# Patient Record
Sex: Male | Born: 1970 | Race: Black or African American | Hispanic: No | Marital: Married | Smoking: Former smoker
Health system: Southern US, Community
[De-identification: ages and names within clinical notes are randomized; demographics above are authoritative.]

## PROBLEM LIST (undated history)

## (undated) DIAGNOSIS — F419 Anxiety disorder, unspecified: Secondary | ICD-10-CM

## (undated) DIAGNOSIS — R05 Cough: Secondary | ICD-10-CM

## (undated) DIAGNOSIS — R059 Cough, unspecified: Secondary | ICD-10-CM

## (undated) DIAGNOSIS — R531 Weakness: Secondary | ICD-10-CM

## (undated) DIAGNOSIS — Z720 Tobacco use: Secondary | ICD-10-CM

## (undated) DIAGNOSIS — Z950 Presence of cardiac pacemaker: Secondary | ICD-10-CM

## (undated) DIAGNOSIS — H547 Unspecified visual loss: Secondary | ICD-10-CM

## (undated) DIAGNOSIS — I639 Cerebral infarction, unspecified: Secondary | ICD-10-CM

## (undated) DIAGNOSIS — I509 Heart failure, unspecified: Secondary | ICD-10-CM

## (undated) DIAGNOSIS — R0602 Shortness of breath: Secondary | ICD-10-CM

## (undated) HISTORY — DX: Cough, unspecified: R05.9

## (undated) HISTORY — DX: Presence of cardiac pacemaker: Z95.0

## (undated) HISTORY — DX: Unspecified visual loss: H54.7

## (undated) HISTORY — DX: Heart failure, unspecified: I50.9

## (undated) HISTORY — DX: Shortness of breath: R06.02

---
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
## 1999-02-26 ENCOUNTER — Emergency Department (HOSPITAL_COMMUNITY): Admission: EM | Admit: 1999-02-26 | Discharge: 1999-02-26 | Payer: Self-pay | Admitting: Emergency Medicine

## 2009-10-10 ENCOUNTER — Emergency Department (HOSPITAL_COMMUNITY): Admission: EM | Admit: 2009-10-10 | Discharge: 2009-10-10 | Payer: Self-pay | Admitting: Emergency Medicine

## 2009-10-12 ENCOUNTER — Emergency Department (HOSPITAL_COMMUNITY): Admission: EM | Admit: 2009-10-12 | Discharge: 2009-10-12 | Payer: Self-pay | Admitting: Emergency Medicine

## 2010-02-13 ENCOUNTER — Emergency Department (HOSPITAL_COMMUNITY): Admission: EM | Admit: 2010-02-13 | Discharge: 2010-02-13 | Payer: Self-pay | Admitting: Emergency Medicine

## 2011-03-02 LAB — DIFFERENTIAL
Eosinophils Absolute: 0.2 10*3/uL (ref 0.0–0.7)
Eosinophils Relative: 2 % (ref 0–5)
Lymphocytes Relative: 21 % (ref 12–46)
Lymphs Abs: 2.8 10*3/uL (ref 0.7–4.0)
Neutro Abs: 9.2 10*3/uL — ABNORMAL HIGH (ref 1.7–7.7)

## 2011-03-02 LAB — CBC
Platelets: 210 10*3/uL (ref 150–400)
RBC: 6.92 MIL/uL — ABNORMAL HIGH (ref 4.22–5.81)
WBC: 13.5 10*3/uL — ABNORMAL HIGH (ref 4.0–10.5)

## 2012-06-28 DIAGNOSIS — Z0271 Encounter for disability determination: Secondary | ICD-10-CM

## 2012-07-12 ENCOUNTER — Ambulatory Visit: Payer: No Typology Code available for payment source

## 2012-07-12 ENCOUNTER — Ambulatory Visit (INDEPENDENT_AMBULATORY_CARE_PROVIDER_SITE_OTHER): Payer: No Typology Code available for payment source | Admitting: Emergency Medicine

## 2012-07-12 VITALS — BP 115/89 | HR 80 | Temp 98.5°F | Resp 18 | Ht 71.0 in | Wt 166.0 lb

## 2012-07-12 DIAGNOSIS — S139XXA Sprain of joints and ligaments of unspecified parts of neck, initial encounter: Secondary | ICD-10-CM

## 2012-07-12 DIAGNOSIS — M546 Pain in thoracic spine: Secondary | ICD-10-CM

## 2012-07-12 DIAGNOSIS — S161XXA Strain of muscle, fascia and tendon at neck level, initial encounter: Secondary | ICD-10-CM

## 2012-07-12 MED ORDER — CYCLOBENZAPRINE HCL 5 MG PO TABS
5.0000 mg | ORAL_TABLET | Freq: Three times a day (TID) | ORAL | Status: AC | PRN
Start: 2012-07-12 — End: 2012-07-22

## 2012-07-12 MED ORDER — IBUPROFEN 600 MG PO TABS: 600.0000 mg | ORAL_TABLET | Freq: Three times a day (TID) | ORAL | Status: AC | PRN
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
## 2012-11-28 DIAGNOSIS — Z0271 Encounter for disability determination: Secondary | ICD-10-CM

## 2017-02-26 ENCOUNTER — Emergency Department (HOSPITAL_COMMUNITY)
Admission: EM | Admit: 2017-02-26 | Discharge: 2017-02-26 | Disposition: A | Discharge status: Home / Self Care | Payer: PRIVATE HEALTH INSURANCE | Attending: Emergency Medicine | Admitting: Emergency Medicine

## 2017-02-26 ENCOUNTER — Encounter (HOSPITAL_COMMUNITY): Payer: Self-pay | Admitting: Emergency Medicine

## 2017-02-26 DIAGNOSIS — F17210 Nicotine dependence, cigarettes, uncomplicated: Secondary | ICD-10-CM | POA: Diagnosis not present

## 2017-02-26 DIAGNOSIS — K0889 Other specified disorders of teeth and supporting structures: Secondary | ICD-10-CM | POA: Insufficient documentation

## 2017-02-26 MED ORDER — PENICILLIN V POTASSIUM 500 MG PO TABS: 1000.0000 mg | ORAL_TABLET | Freq: Two times a day (BID) | ORAL | 0 refills | Status: DC

## 2017-02-26 MED ORDER — BUPIVACAINE-EPINEPHRINE (PF) 0.5% -1:200000 IJ SOLN
1.8000 mL | Freq: Once | INTRAMUSCULAR | Status: AC
  Administered 2017-02-26: 1.8 mL
  Filled 2017-02-26: qty 1.8

## 2017-02-26 NOTE — ED Provider Notes (Signed)
[facility]-EMERGENCY DEPT Provider Note   CSN: [id number] Arrival date & time: 02/26/17  0030     History   Chief Complaint Chief Complaint  Patient presents with  . Dental Pain    HPI Brandon Summers is a 46 y.o. male with No major medical history presents to the Emergency Department complaining of gradual, persistent, progressively worsening right lower dental pain onset earlier today. Patient reports he's had a dental caries in this area for a significant amount of time however they have not given him pain like today.  He denies fevers or chills, nausea or vomiting. He denies facial swelling. Patient has not seen a dentist in a very long time. Eating and drinking make the symptoms worse. Patient is taking ibuprofen without relief. Nothing seems to make the symptoms better.  The history is provided by the patient and medical records. No language interpreter was used.  Dental Pain      History reviewed. No pertinent past medical history.  There are no active problems to display for this patient.   History reviewed. No pertinent surgical history.     Home Medications    Prior to Admission medications   Medication Sig Start Date End Date Taking? Authorizing Provider  penicillin v potassium (VEETID) 500 MG tablet Take 2 tablets (1,000 mg total) by mouth 2 (two) times daily. X 7 days 02/26/17   Dierdre Forth, PA-C    Family History Family History  Problem Relation Age of Onset  . Hypertension Mother   . Stroke Mother     Social History Social History  Substance Use Topics  . Smoking status: Current Every Day Smoker    Packs/day: 0.50    Types: Cigarettes  . Smokeless tobacco: Never Used  . Alcohol use Yes     Comment: occasional     Allergies   Asa [aspirin]   Review of Systems Review of Systems  HENT: Positive for dental problem.   All other systems reviewed and are negative.    Physical Exam Updated Vital Signs BP (!) 141/89 (BP Location: Right Arm)    Pulse 78   Temp 98.2 F (36.8 C) (Oral)   Resp 18   Ht 6' (1.829 m)   Wt 58.9 kg   SpO2 97%   BMI 17.60 kg/m   Physical Exam  Constitutional: He appears well-developed and well-nourished.  HENT:  Head: Normocephalic.  Right Ear: Tympanic membrane, external ear and ear canal normal.  Left Ear: Tympanic membrane, external ear and ear canal normal.  Nose: Nose normal. Right sinus exhibits no maxillary sinus tenderness and no frontal sinus tenderness. Left sinus exhibits no maxillary sinus tenderness and no frontal sinus tenderness.  Mouth/Throat: Uvula is midline, oropharynx is clear and moist and mucous membranes are normal. No oral lesions. Abnormal dentition. Dental caries present. No uvula swelling or lacerations. No oropharyngeal exudate, posterior oropharyngeal edema, posterior oropharyngeal erythema or tonsillar abscesses.  Tooth # 30-32 with extensive dental caries, eroding teeth to the gingiva No gross abscess No fluctuance or induration to the buccal mucosa or floor of the mouth  Eyes: Conjunctivae are normal. Pupils are equal, round, and reactive to light. Right eye exhibits no discharge. Left eye exhibits no discharge.  Neck: Normal range of motion. Neck supple.  No stridor Handling secretions without difficulty No nuchal rigidity No cervical lymphadenopathy  Cardiovascular: Normal rate, regular rhythm and normal heart sounds.   Pulmonary/Chest: Effort normal. No respiratory distress.  Equal chest rise  Abdominal: Soft. Bowel sounds are  normal. He exhibits no distension. There is no tenderness.  Lymphadenopathy:    He has no cervical adenopathy.  Neurological: He is alert.  Skin: Skin is warm and dry.  Psychiatric: He has a normal mood and affect.  Nursing note and vitals reviewed.    ED Treatments / Results    Procedures Dental Block Date/Time: 02/26/2017 3:48 AM Performed by: Dierdre Forth Authorized by: Dierdre Forth   Consent:    Consent  obtained:  Verbal   Consent given by:  Patient   Risks discussed:  Allergic reaction, infection, intravascular injection, nerve damage and pain   Alternatives discussed:  No treatment Indications:    Indications: dental pain   Location:    Block type:  Supraperiosteal   Supraperiosteal location:  Lower teeth   Lower teeth location:  30/RL 1st molar, 31/RL 2nd molar and 32/RL 3rd molar Procedure details (see MAR for exact dosages):    Syringe type:  Controlled syringe   Needle gauge:  27 G   Anesthetic injected:  Bupivacaine 0.5% WITH epi   Injection procedure:  Anatomic landmarks identified, anatomic landmarks palpated, negative aspiration for blood, introduced needle and incremental injection Post-procedure details:    Outcome:  Pain relieved   Patient tolerance of procedure:  Tolerated well, no immediate complications   (including critical care time)  Medications Ordered in ED Medications  bupivacaine-epinephrine (MARCAINE W/ EPI) 0.5% -1:200000 injection 1.8 mL (1.8 mLs Infiltration Given 02/26/17 0331)     Initial Impression / Assessment and Plan / ED Course  I have reviewed the triage vital signs and the nursing notes.  Pertinent labs & imaging results that were available during my care of the patient were reviewed by me and considered in my medical decision making (see chart for details).     Patient with toothache.  No gross abscess.  Exam unconcerning for Ludwig's angina or spread of infection.  Will treat with penicillin andibuprofen.  Urged patient to follow-up with dentist.     Final Clinical Impressions(s) / ED Diagnoses   Final diagnoses:  Pain, dental    New Prescriptions New Prescriptions   PENICILLIN V POTASSIUM (VEETID) 500 MG TABLET    Take 2 tablets (1,000 mg total) by mouth 2 (two) times daily. X 7 days     Dierdre Forth, PA-C 02/26/17 1610    Tomasita Crumble, MD 02/26/17 [phone]

## 2017-02-26 NOTE — Discharge Instructions (Signed)
1. Medications: penicillin, usual home medications °2. Treatment: rest, drink plenty of fluids, take medications as prescribed °3. Follow Up: Please followup with dentistry within 1 week for discussion of your diagnoses and further evaluation after today's visit; if you do not have a primary care doctor use the resource guide provided to find one; Return to the ER for high fevers, difficulty breathing, difficulty swallowing or other concerning symptoms ° °

## 2017-02-26 NOTE — ED Notes (Signed)
Pt has onset of dental pain today in the bottom right molars. He has taken advil for pain management.

## 2017-02-26 NOTE — ED Notes (Signed)
No respiratory or acute distress noted alert and oriented x 3 call light in reach. 

## 2017-02-26 NOTE — ED Triage Notes (Signed)
Pt reports dental pain to right lower portion of mouth that started today.

## 2019-05-21 ENCOUNTER — Emergency Department (HOSPITAL_COMMUNITY): Payer: Self-pay

## 2019-05-21 ENCOUNTER — Encounter (HOSPITAL_COMMUNITY): Payer: Self-pay

## 2019-05-21 ENCOUNTER — Other Ambulatory Visit: Payer: Self-pay

## 2019-05-21 ENCOUNTER — Inpatient Hospital Stay (HOSPITAL_COMMUNITY)
Admission: EM | Admit: 2019-05-21 | Discharge: 2019-05-23 | DRG: 066 | Disposition: A | Discharge status: Home Health | Payer: PRIVATE HEALTH INSURANCE | Attending: Internal Medicine | Admitting: Internal Medicine

## 2019-05-21 DIAGNOSIS — I639 Cerebral infarction, unspecified: Secondary | ICD-10-CM

## 2019-05-21 DIAGNOSIS — R29706 NIHSS score 6: Secondary | ICD-10-CM | POA: Diagnosis present

## 2019-05-21 DIAGNOSIS — Z720 Tobacco use: Secondary | ICD-10-CM

## 2019-05-21 DIAGNOSIS — Z823 Family history of stroke: Secondary | ICD-10-CM | POA: Diagnosis not present

## 2019-05-21 DIAGNOSIS — G459 Transient cerebral ischemic attack, unspecified: Secondary | ICD-10-CM

## 2019-05-21 DIAGNOSIS — I6389 Other cerebral infarction: Secondary | ICD-10-CM | POA: Diagnosis not present

## 2019-05-21 DIAGNOSIS — Z886 Allergy status to analgesic agent status: Secondary | ICD-10-CM | POA: Diagnosis not present

## 2019-05-21 DIAGNOSIS — H532 Diplopia: Secondary | ICD-10-CM | POA: Diagnosis not present

## 2019-05-21 DIAGNOSIS — Z20828 Contact with and (suspected) exposure to other viral communicable diseases: Secondary | ICD-10-CM | POA: Diagnosis present

## 2019-05-21 DIAGNOSIS — Z8249 Family history of ischemic heart disease and other diseases of the circulatory system: Secondary | ICD-10-CM

## 2019-05-21 DIAGNOSIS — H538 Other visual disturbances: Secondary | ICD-10-CM | POA: Diagnosis present

## 2019-05-21 DIAGNOSIS — E785 Hyperlipidemia, unspecified: Secondary | ICD-10-CM | POA: Diagnosis not present

## 2019-05-21 DIAGNOSIS — D751 Secondary polycythemia: Secondary | ICD-10-CM | POA: Diagnosis present

## 2019-05-21 DIAGNOSIS — F17210 Nicotine dependence, cigarettes, uncomplicated: Secondary | ICD-10-CM | POA: Diagnosis present

## 2019-05-21 DIAGNOSIS — J432 Centrilobular emphysema: Secondary | ICD-10-CM | POA: Diagnosis present

## 2019-05-21 DIAGNOSIS — R4182 Altered mental status, unspecified: Secondary | ICD-10-CM | POA: Diagnosis not present

## 2019-05-21 HISTORY — DX: Cerebral infarction, unspecified: I63.9

## 2019-05-21 LAB — I-STAT CHEM 8, ED
BUN: 11 mg/dL (ref 6–20)
Calcium, Ion: 1.2 mmol/L (ref 1.15–1.40)
Chloride: 104 mmol/L (ref 98–111)
Creatinine, Ser: 1.1 mg/dL (ref 0.61–1.24)
Glucose, Bld: 89 mg/dL (ref 70–99)
HCT: 54 % — ABNORMAL HIGH (ref 39.0–52.0)
Hemoglobin: 18.4 g/dL — ABNORMAL HIGH (ref 13.0–17.0)
Potassium: 4.3 mmol/L (ref 3.5–5.1)
Sodium: 139 mmol/L (ref 135–145)
TCO2: 27 mmol/L (ref 22–32)

## 2019-05-21 LAB — COMPREHENSIVE METABOLIC PANEL
ALT: 44 U/L (ref 0–44)
AST: 24 U/L (ref 15–41)
Albumin: 3.3 g/dL — ABNORMAL LOW (ref 3.5–5.0)
Alkaline Phosphatase: 52 U/L (ref 38–126)
Anion gap: 7 (ref 5–15)
BUN: 12 mg/dL (ref 6–20)
CO2: 30 mmol/L (ref 22–32)
Calcium: 9 mg/dL (ref 8.9–10.3)
Chloride: 104 mmol/L (ref 98–111)
Creatinine, Ser: 1.12 mg/dL (ref 0.61–1.24)
GFR calc Af Amer: >60 mL/min (ref >60)
GFR calc non Af Amer: >60 mL/min (ref >60)
Glucose, Bld: 95 mg/dL (ref 70–99)
Potassium: 4.4 mmol/L (ref 3.5–5.1)
Sodium: 141 mmol/L (ref 135–145)
Total Bilirubin: 0.5 mg/dL (ref 0.3–1.2)
Total Protein: 6 g/dL — ABNORMAL LOW (ref 6.5–8.1)

## 2019-05-21 LAB — DIFFERENTIAL
Abs Immature Granulocytes: 0.03 10*3/uL (ref 0.00–0.07)
Basophils Absolute: 0 10*3/uL (ref 0.0–0.1)
Basophils Relative: 0 %
Eosinophils Absolute: 0.2 10*3/uL (ref 0.0–0.5)
Eosinophils Relative: 3 %
Immature Granulocytes: 0 %
Lymphocytes Relative: 29 %
Lymphs Abs: 2.1 10*3/uL (ref 0.7–4.0)
Monocytes Absolute: 0.6 10*3/uL (ref 0.1–1.0)
Monocytes Relative: 8 %
Neutro Abs: 4.4 10*3/uL (ref 1.7–7.7)
Neutrophils Relative %: 60 %

## 2019-05-21 LAB — PROTIME-INR
INR: 0.8 (ref 0.8–1.2)
Prothrombin Time: 11.2 seconds — ABNORMAL LOW (ref 11.4–15.2)

## 2019-05-21 LAB — CBC
HCT: 53 % — ABNORMAL HIGH (ref 39.0–52.0)
Hemoglobin: 16.7 g/dL (ref 13.0–17.0)
MCH: 24 pg — ABNORMAL LOW (ref 26.0–34.0)
MCHC: 31.5 g/dL (ref 30.0–36.0)
MCV: 76 fL — ABNORMAL LOW (ref 80.0–100.0)
Platelets: 226 10*3/uL (ref 150–400)
RBC: 6.97 MIL/uL — ABNORMAL HIGH (ref 4.22–5.81)
RDW: 18.9 % — ABNORMAL HIGH (ref 11.5–15.5)
WBC: 7.3 10*3/uL (ref 4.0–10.5)
nRBC: 0 % (ref 0.0–0.2)

## 2019-05-21 LAB — CBG MONITORING, ED: Glucose-Capillary: 136 mg/dL — ABNORMAL HIGH (ref 70–99)

## 2019-05-21 LAB — SARS CORONAVIRUS 2 BY RT PCR (HOSPITAL ORDER, PERFORMED IN ~~LOC~~ HOSPITAL LAB): SARS Coronavirus 2: NEGATIVE

## 2019-05-21 LAB — APTT: aPTT: 23 seconds — ABNORMAL LOW (ref 24–36)

## 2019-05-21 MED ORDER — ACETAMINOPHEN 325 MG PO TABS: 650.0000 mg | ORAL_TABLET | ORAL | Status: DC | PRN

## 2019-05-21 MED ORDER — ACETAMINOPHEN 650 MG RE SUPP: 650.0000 mg | RECTAL | Status: DC | PRN

## 2019-05-21 MED ORDER — ASPIRIN 300 MG RE SUPP
300.0000 mg | Freq: Once | RECTAL | Status: AC
  Administered 2019-05-21: 300 mg via RECTAL
  Filled 2019-05-21: qty 1

## 2019-05-21 MED ORDER — SODIUM CHLORIDE 0.9% FLUSH
3.0000 mL | Freq: Once | INTRAVENOUS | Status: AC
  Administered 2019-05-21: 3 mL via INTRAVENOUS

## 2019-05-21 MED ORDER — ACETAMINOPHEN 160 MG/5ML PO SOLN: 650.0000 mg | ORAL | Status: DC | PRN

## 2019-05-21 MED ORDER — SENNOSIDES-DOCUSATE SODIUM 8.6-50 MG PO TABS: 1.0000 | ORAL_TABLET | Freq: Every evening | ORAL | Status: DC | PRN

## 2019-05-21 MED ORDER — STROKE: EARLY STAGES OF RECOVERY BOOK
Freq: Once | Status: AC
  Administered 2019-05-22: 06:00:00
  Filled 2019-05-21 (×2): qty 1

## 2019-05-21 MED ORDER — ASPIRIN 325 MG PO TABS: 325.0000 mg | ORAL_TABLET | Freq: Every day | ORAL | Status: DC

## 2019-05-21 MED ORDER — ENOXAPARIN SODIUM 40 MG/0.4ML ~~LOC~~ SOLN
40.0000 mg | Freq: Every day | SUBCUTANEOUS | Status: DC
  Administered 2019-05-22: 40 mg via SUBCUTANEOUS
  Filled 2019-05-21: qty 0.4

## 2019-05-21 NOTE — H&P (Signed)
History and Physical   [facility] - [place] [facility] [place] Admission History and Physical [employer], D.O.    Patient Name: Brandon Summers MR#: [id number] Date of Birth: 1971-06-03 Date of Admission: 05/21/2019  Referring MD/NP/PA: Dr. Billy Fischer Primary Care Physician: Patient, No Pcp Per  Chief Complaint:  Chief Complaint  Patient presents with  . Blurred Vision  . Altered Mental Status    HPI: Brandon Summers is a 48 y.o. male with no known medical history presents to the emergency department for evaluation of blurry vision.  Patient was in a usual state of health until last night when he reports the onset of blurry vision.  He reports that when both eyes are open he has blurry vision however when each of his eyes are closed his vision is fine.  Apparently his wife reported that he had difficulty holding objects, slurred speech, generalized body weakness..  In addition he reports having chronic headaches over the past year which have been worsening recently.   Otherwise there has been no change in status. Patient has been taking medication as prescribed and there has been no recent change in medication or diet.  No recent antibiotics.  There has been no recent illness, hospitalizations, travel or sick contacts.    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  Review of Systems:  CONSTITUTIONAL: Positive weakness.  No fever/chills, fatigue, weight gain/loss, headache. EYES: Positive blurry, double vision. ENT: No tinnitus, postnasal drip, redness or soreness of the oropharynx. RESPIRATORY: No cough, dyspnea, wheeze.  No hemoptysis.  CARDIOVASCULAR: No chest pain, palpitations, syncope, orthopnea. No lower extremity edema.  GASTROINTESTINAL: No nausea, vomiting, abdominal pain, diarrhea, constipation.  No hematemesis, melena or hematochezia. GENITOURINARY: No dysuria, frequency, hematuria. ENDOCRINE: No  polyuria or nocturia. No heat or cold intolerance. HEMATOLOGY: No anemia, bruising, bleeding. INTEGUMENTARY: No rashes, ulcers, lesions. MUSCULOSKELETAL: No arthritis, gout. NEUROLOGIC: Positive hand weakness, slurred speech, blurred vision, headache .no numbness, tingling, ataxia, seizure-type activit. PSYCHIATRIC: No anxiety, depression, insomnia.   History reviewed. No pertinent past medical history.  Note patient does not visit a doctor  History reviewed. No pertinent surgical history.   reports that he has been smoking cigarettes. He has been smoking about 0.50 packs per day. He has never used smokeless tobacco. He reports current alcohol use. He reports that he does not use drugs.  Allergies  Allergen Reactions  . Asa [Aspirin]   Patient reports that aspirin causes an upset stomach.  He denies ever having had shortness of breath, hives or other allergic reactions to aspirin.  Family History  Problem Relation Age of Onset  . Hypertension Mother   . Stroke Mother     Prior to Admission medications   Medication Sig Start Date End Date Taking? Authorizing Provider  penicillin v potassium (VEETID) 500 MG tablet Take 2 tablets (1,000 mg total) by mouth 2 (two) times daily. X 7 days Patient not taking: Reported on 05/21/2019 02/26/17   Abigail Butts, PA-C    Physical Exam: Vitals:   05/21/19 1426 05/21/19 1611 05/21/19 1834  BP: 138/90 118/81 125/84  Pulse: 63 (!) 58 (!) 54  Resp: (!) 22 16 16   Temp: 98.4 F (36.9 C) 98 F (36.7 C)   TempSrc: Oral Oral   SpO2: 100% 100% 100%    GENERAL: 48 y.o.-year-old male patient, well-developed, well-nourished lying in the bed in no acute distress.  Pleasant and cooperative.   HEENT: Head atraumatic, normocephalic.  Pupils equal. Mucus membranes moist. NECK: Supple. No JVD. CHEST: Normal breath sounds bilaterally. No wheezing, rales, rhonchi or crackles. No use of accessory muscles of respiration.  No reproducible chest wall  tenderness.  CARDIOVASCULAR: S1, S2 normal. No murmurs, rubs, or gallops. Cap refill <2 seconds. Pulses intact distally.  ABDOMEN: Soft, nondistended, nontender. No rebound, guarding, rigidity. Normoactive bowel sounds present in all four quadrants.  EXTREMITIES: No pedal edema, cyanosis, or clubbing. No calf tenderness or Homan's sign.  NEUROLOGIC: The patient is alert and oriented x 3. Cranial nerves II through XII are grossly intact with no focal sensorimotor deficit.   PSYCHIATRIC:  Normal affect, mood, thought content. SKIN: Warm, dry, and intact without obvious rash, lesion, or ulcer.    Labs on Admission:  CBC: Recent Labs  Lab 05/21/19 1458 05/21/19 1507  WBC 7.3  --   NEUTROABS 4.4  --   HGB 16.7 18.4*  HCT 53.0* 54.0*  MCV 76.0*  --   PLT 226  --    Basic Metabolic Panel: Recent Labs  Lab 05/21/19 1458 05/21/19 1507  NA 141 139  K 4.4 4.3  CL 104 104  CO2 30  --   GLUCOSE 95 89  BUN 12 11  CREATININE 1.12 1.10  CALCIUM 9.0  --    GFR: CrCl cannot be calculated (Unknown ideal weight.). Liver Function Tests: Recent Labs  Lab 05/21/19 1458  AST 24  ALT 44  ALKPHOS 52  BILITOT 0.5  PROT 6.0*  ALBUMIN 3.3*   No results for input(s): LIPASE, AMYLASE in the last 168 hours. No results for input(s): AMMONIA in the last 168 hours. Coagulation Profile: Recent Labs  Lab 05/21/19 1458  INR 0.8   Cardiac Enzymes: No results for input(s): CKTOTAL, CKMB, CKMBINDEX, TROPONINI in the last 168 hours. BNP (last 3 results) No results for input(s): PROBNP in the last 8760 hours. HbA1C: No results for input(s): HGBA1C in the last 72 hours. CBG: Recent Labs  Lab 05/21/19 1423  GLUCAP 136*   Lipid Profile: No results for input(s): CHOL, HDL, LDLCALC, TRIG, CHOLHDL, LDLDIRECT in the last 72 hours. Thyroid Function Tests: No results for input(s): TSH, T4TOTAL, FREET4, T3FREE, THYROIDAB in the last 72 hours. Anemia Panel: No results for input(s): VITAMINB12,  FOLATE, FERRITIN, TIBC, IRON, RETICCTPCT in the last 72 hours. Urine analysis: No results found for: COLORURINE, APPEARANCEUR, LABSPEC, PHURINE, GLUCOSEU, HGBUR, BILIRUBINUR, KETONESUR, PROTEINUR, UROBILINOGEN, NITRITE, LEUKOCYTESUR Sepsis Labs: @LABRCNTIP (procalcitonin:4,lacticidven:4) )No results found for this or any previous visit (from the past 240 hour(s)).   Radiological Exams on Admission: Ct Head Wo Contrast  Result Date: 05/21/2019 CLINICAL DATA:  Headache and confusion with blurred vision EXAM: CT HEAD WITHOUT CONTRAST TECHNIQUE: Contiguous axial images were obtained from the base of the skull through the vertex without intravenous contrast. COMPARISON:  None. FINDINGS: Brain: The ventricles are normal in size and configuration. There is no intracranial mass, hemorrhage, extra-axial fluid collection, or midline shift. The brain parenchyma appears unremarkable. No acute infarct evident. Vascular: No hyperdense vessel. No vascular calcifications are appreciable. Skull: The bony calvarium appears intact. Sinuses/Orbits: There is mild mucosal thickening in several ethmoid air cells. Other visualized paranasal sinuses are clear. Orbits appear symmetric bilaterally. Other: Mastoid air cells are clear. IMPRESSION: Mild mucosal thickening in several ethmoid air cells. Study otherwise unremarkable. Electronically Signed   By: Bretta BangWilliam  Woodruff III M.D.   On: 05/21/2019 15:34   Mr Brain Wo Contrast  Result Date: 05/21/2019 CLINICAL DATA:  Initial evaluation for acute  blurry vision, headache. EXAM: MRI HEAD WITHOUT CONTRAST TECHNIQUE: Multiplanar, multiecho pulse sequences of the brain and surrounding structures were obtained without intravenous contrast. COMPARISON:  Prior CT from earlier the same day. FINDINGS: Brain: Examination moderately degraded by motion artifact. Cerebral volume within normal limits for age. Minimal chronic microvascular ischemic changes noted within the periventricular white  matter. 1.3 x 1.5 x 2.2 cm acute ischemic nonhemorrhagic infarcts seen involving the ventral medial left thalamus (series 3, image 28). No associated mass effect. No other evidence for acute or subacute ischemia. Gray-white matter differentiation otherwise maintained. No other areas of chronic infarction. No foci of susceptibility artifact to suggest acute or chronic intracranial hemorrhage. No mass lesion, midline shift or mass effect. No hydrocephalus. No extra-axial fluid collection. Pituitary gland within normal limits. Vascular: Major intracranial vascular flow voids are maintained. Skull and upper cervical spine: Craniocervical junction within normal limits. Upper cervical spine normal. Bone marrow signal intensity within normal limits. No scalp soft tissue abnormality. Sinuses/Orbits: Globes and orbital soft tissues within normal limits. Paranasal sinuses are clear. No mastoid effusion. Other: None. IMPRESSION: 1. 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 2. Underlying mild chronic microvascular ischemic disease for age. Electronically Signed   By: Rise MuBenjamin  McClintock M.D.   On: 05/21/2019 18:31    EKG: Normal sinus rhythm at 64 bpm with normal axis and nonspecific ST-T wave changes.   Assessment/Plan  This is a 48 y.o. male with no known history now being admitted with:  #. CVA of the left thalamus - [facility] - Studies: Echo, Carotids - Labs: CBC, BMP, Lipids, TFTs, A1C - Nursing: Neurochecks, O2, dysphagia screen, permissive hypertension.  - Consults: Neurology, PT/OT, S/S consults.  - Meds: Will give aspirin 325mg  now, then daily aspirin 81mg .   - Fluids: HL  - Routine DVT Px: with Lovenox, SCDs, early ambulation   Admission status: Inpatient tele IV Fluids: HL Diet/Nutrition: NPO pending swallow eval Consults called: Neuro called by EDP  DVT Px: Lovenox, SCDs and early ambulation. Code Status: Full Code   Disposition Plan: To home in 1-2 days  All the records are reviewed and case discussed with ED provider. Management plans discussed with the patient and/or family who express understanding and agree with plan of care.  Amayrani Bennick D.O. on 05/21/2019 at 6:56 PM CC: Primary care physician; Patient, No Pcp Per   05/21/2019, 6:56 PM

## 2019-05-21 NOTE — ED Triage Notes (Addendum)
C/o blurred vision, headache, confusion, and sore throat that started last night   Ex wife states patients motor functions have been decreasing since last night.   Ex wife states she gave him a cup to hold and he was unable to hold it.   Ex wife states patient is confused and is getting worse.    C/O constipation X1 month ago.  C/o headache X 1 year.    Patient unable to answer questions.   Patient in wheelchair in triage.   Please call Otila Kluver for [phone]   Per ex wife no medical hx. Patient has never been to the doctor.

## 2019-05-21 NOTE — ED Notes (Signed)
Patient returned from MRI.

## 2019-05-21 NOTE — ED Provider Notes (Signed)
[facility]-EMERGENCY DEPT Provider Note   CSN: [id number] Arrival date & time: 05/21/19  1353    History   Chief Complaint Chief Complaint  Patient presents with  . Blurred Vision  . Altered Mental Status    HPI Brandon Summers is a 48 y.o. male.     HPI  Hx from pt limited, wife providing much of history over phone Headaches for a while, for a year, worse recently Last night vision change Last night was dropping cup, not walking well on his own, like his whole body was limp, can't get a whole sentence out, slurring words Hx of smoking, chronic cough, coughing up brown sputum No fevers, no vomiting, no abdominal pain, no diarrhea. Has been constipated for a month Works hard No known tick exposures   Occ etoh, no other drugs, smokes a lot of cigarettes Has never been to the doctor   History reviewed. No pertinent past medical history.  Patient Active Problem List   Diagnosis Date Noted  . CVA (cerebral vascular accident) (HCC) 05/21/2019    History reviewed. No pertinent surgical history.      Home Medications    Prior to Admission medications   Medication Sig Start Date End Date Taking? Authorizing Provider  penicillin v potassium (VEETID) 500 MG tablet Take 2 tablets (1,000 mg total) by mouth 2 (two) times daily. X 7 days Patient not taking: Reported on 05/21/2019 02/26/17   Muthersbaugh, Dahlia ClientHannah, PA-C    Family History Family History  Problem Relation Age of Onset  . Hypertension Mother   . Stroke Mother     Social History Social History   Tobacco Use  . Smoking status: Current Every Day Smoker    Packs/day: 0.50    Types: Cigarettes  . Smokeless tobacco: Never Used  Substance Use Topics  . Alcohol use: Yes    Comment: occasional  . Drug use: No     Allergies   Asa [aspirin]   Review of Systems Review of Systems  Constitutional: Negative for fever.  HENT: Negative for sore throat.   Eyes: Positive for visual  disturbance.  Respiratory: Negative for shortness of breath.   Cardiovascular: Negative for chest pain.  Gastrointestinal: Negative for abdominal pain, diarrhea, nausea and vomiting.  Genitourinary: Negative for difficulty urinating.  Musculoskeletal: Negative for back pain and neck stiffness.  Skin: Negative for rash.  Neurological: Positive for speech difficulty (per wife), weakness (per wife generalized) and headaches. Negative for dizziness, syncope, facial asymmetry and numbness.     Physical Exam Updated Vital Signs BP 125/78   Pulse (!) 55   Temp 98.4 F (36.9 C) (Oral)   Resp 14   SpO2 100%   Physical Exam Vitals signs and nursing note reviewed.  Constitutional:      General: He is not in acute distress.    Appearance: He is well-developed. He is not diaphoretic.  HENT:     Head: Normocephalic and atraumatic.  Eyes:     Conjunctiva/sclera: Conjunctivae normal.  Neck:     Musculoskeletal: Normal range of motion.  Cardiovascular:     Rate and Rhythm: Normal rate and regular rhythm.     Heart sounds: Normal heart sounds.  Pulmonary:     Effort: Pulmonary effort is normal. No respiratory distress.     Breath sounds: Normal breath sounds.  Abdominal:     General: There is no distension.     Palpations: Abdomen is soft.     Tenderness: There is no  abdominal tenderness. There is no guarding.  Skin:    General: Skin is warm and dry.  Neurological:     Mental Status: He is alert and oriented to person, place, and time.     Sensory: Sensation is intact. No sensory deficit.     Motor: Motor function is intact.     Coordination: Coordination is intact.     Comments: Limited upward gaze Sleepy Slow to answer questions      ED Treatments / Results  Labs (all labs ordered are listed, but only abnormal results are displayed) Labs Reviewed  PROTIME-INR - Abnormal; Notable for the following components:      Result Value   Prothrombin Time 11.2 (*)    All other  components within normal limits  APTT - Abnormal; Notable for the following components:   aPTT 23 (*)    All other components within normal limits  CBC - Abnormal; Notable for the following components:   RBC 6.97 (*)    HCT 53.0 (*)    MCV 76.0 (*)    MCH 24.0 (*)    RDW 18.9 (*)    All other components within normal limits  COMPREHENSIVE METABOLIC PANEL - Abnormal; Notable for the following components:   Total Protein 6.0 (*)    Albumin 3.3 (*)    All other components within normal limits  I-STAT CHEM 8, ED - Abnormal; Notable for the following components:   Hemoglobin 18.4 (*)    HCT 54.0 (*)    All other components within normal limits  CBG MONITORING, ED - Abnormal; Notable for the following components:   Glucose-Capillary 136 (*)    All other components within normal limits  SARS CORONAVIRUS 2 (HOSPITAL ORDER, [place] [facility])  DIFFERENTIAL  CREATININE, SERUM  HIV ANTIBODY (ROUTINE TESTING W REFLEX)  HEMOGLOBIN A1C  LIPID PANEL  URINALYSIS, DIPSTICK ONLY    EKG EKG Interpretation  Date/Time:  Tuesday May 21 2019 14:24:36 EDT Ventricular Rate:  64 PR Interval:    QRS Duration: 107 QT Interval:  381 QTC Calculation: 393 R Axis:   67 Text Interpretation:  Sinus rhythm Biatrial enlargement Borderline T abnormalities, inferior leads Artifact No previous ECGs available Confirmed by Gareth Morgan [phone]) on 05/21/2019 3:45:07 PM   Radiology Dg Chest 2 View  Result Date: 05/22/2019 CLINICAL DATA:  48 year old male with altered mental status. EXAM: CHEST - 2 VIEW COMPARISON:  Chest radiograph dated 02/13/2010 FINDINGS: The heart size and mediastinal contours are within normal limits. Both lungs are clear. The visualized skeletal structures are unremarkable. IMPRESSION: No active cardiopulmonary disease. Electronically Signed   By: Anner Crete M.D.   On: 05/22/2019 00:45   Ct Head Wo Contrast  Result Date: 05/21/2019 CLINICAL DATA:  Headache  and confusion with blurred vision EXAM: CT HEAD WITHOUT CONTRAST TECHNIQUE: Contiguous axial images were obtained from the base of the skull through the vertex without intravenous contrast. COMPARISON:  None. FINDINGS: Brain: The ventricles are normal in size and configuration. There is no intracranial mass, hemorrhage, extra-axial fluid collection, or midline shift. The brain parenchyma appears unremarkable. No acute infarct evident. Vascular: No hyperdense vessel. No vascular calcifications are appreciable. Skull: The bony calvarium appears intact. Sinuses/Orbits: There is mild mucosal thickening in several ethmoid air cells. Other visualized paranasal sinuses are clear. Orbits appear symmetric bilaterally. Other: Mastoid air cells are clear. IMPRESSION: Mild mucosal thickening in several ethmoid air cells. Study otherwise unremarkable. Electronically Signed   By: Lowella Grip  III M.D.   On: 05/21/2019 15:34   Mr Brain Wo Contrast  Result Date: 05/21/2019 CLINICAL DATA:  Initial evaluation for acute blurry vision, headache. EXAM: MRI HEAD WITHOUT CONTRAST TECHNIQUE: Multiplanar, multiecho pulse sequences of the brain and surrounding structures were obtained without intravenous contrast. COMPARISON:  Prior CT from earlier the same day. FINDINGS: Brain: Examination moderately degraded by motion artifact. Cerebral volume within normal limits for age. Minimal chronic microvascular ischemic changes noted within the periventricular white matter. 1.3 x 1.5 x 2.2 cm acute ischemic nonhemorrhagic infarcts seen involving the ventral medial left thalamus (series 3, image 28). No associated mass effect. No other evidence for acute or subacute ischemia. Gray-white matter differentiation otherwise maintained. No other areas of chronic infarction. No foci of susceptibility artifact to suggest acute or chronic intracranial hemorrhage. No mass lesion, midline shift or mass effect. No hydrocephalus. No extra-axial fluid  collection. Pituitary gland within normal limits. Vascular: Major intracranial vascular flow voids are maintained. Skull and upper cervical spine: Craniocervical junction within normal limits. Upper cervical spine normal. Bone marrow signal intensity within normal limits. No scalp soft tissue abnormality. Sinuses/Orbits: Globes and orbital soft tissues within normal limits. Paranasal sinuses are clear. No mastoid effusion. Other: None. IMPRESSION: 1. 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 2. Underlying mild chronic microvascular ischemic disease for age. Electronically Signed   By: Rise MuBenjamin  McClintock M.D.   On: 05/21/2019 18:31    Procedures Procedures (including critical care time)  Medications Ordered in ED Medications  enoxaparin (LOVENOX) injection 40 mg (has no administration in time range)   stroke: mapping our early stages of recovery book (has no administration in time range)  acetaminophen (TYLENOL) tablet 650 mg (has no administration in time range)    Or  acetaminophen (TYLENOL) solution 650 mg (has no administration in time range)    Or  acetaminophen (TYLENOL) suppository 650 mg (has no administration in time range)  senna-docusate (Senokot-S) tablet 1 tablet (has no administration in time range)  sodium chloride flush (NS) 0.9 % injection 3 mL (3 mLs Intravenous Given 05/21/19 1921)  aspirin suppository 300 mg (300 mg Rectal Given 05/21/19 2144)     Initial Impression / Assessment and Plan / ED Course  I have reviewed the triage vital signs and the nursing notes.  Pertinent labs & imaging results that were available during my care of the patient were reviewed by me and considered in my medical decision making (see chart for details).        48 year old male with history of smoking no other known medical history presents with concern for double vision beginning last night, with wife also reporting patient dropping  things and slurring words.  MRI shows acute ischemic nonhemorrhagic infarct of the ventral medial left thalamus.  Consulted neurology and spoke with Dr. Amada JupiterKirkpatrick, and neurology will evaluate him when [facility].  Spoke with the hospitalist who [facility].  Updated his wife Inetta Fermoina.  Final Clinical Impressions(s) / ED Diagnoses   Final diagnoses:  TIA (transient ischemic attack)  Cerebrovascular accident (CVA), unspecified mechanism [facility](HCC)    ED Discharge Orders    None       Alvira MondaySchlossman, Loyde Orth, MD 05/22/19 [phone]

## 2019-05-21 NOTE — ED Notes (Signed)
Released all admissions order since patient is staying longer than 6 hours and awaiting for transfer to [facility]

## 2019-05-22 ENCOUNTER — Inpatient Hospital Stay (HOSPITAL_COMMUNITY): Payer: PRIVATE HEALTH INSURANCE

## 2019-05-22 ENCOUNTER — Inpatient Hospital Stay (HOSPITAL_COMMUNITY): Payer: Self-pay

## 2019-05-22 DIAGNOSIS — H532 Diplopia: Secondary | ICD-10-CM | POA: Diagnosis not present

## 2019-05-22 DIAGNOSIS — I639 Cerebral infarction, unspecified: Secondary | ICD-10-CM | POA: Diagnosis not present

## 2019-05-22 DIAGNOSIS — E785 Hyperlipidemia, unspecified: Secondary | ICD-10-CM | POA: Diagnosis not present

## 2019-05-22 LAB — LIPID PANEL
Cholesterol: 170 mg/dL (ref 0–200)
HDL: 44 mg/dL (ref >40)
LDL Cholesterol: 114 mg/dL — ABNORMAL HIGH (ref 0–99)
Total CHOL/HDL Ratio: 3.9 RATIO
Triglycerides: 61 mg/dL (ref <150)
VLDL: 12 mg/dL (ref 0–40)

## 2019-05-22 LAB — HEMOGLOBIN A1C
Hgb A1c MFr Bld: 5.7 % — ABNORMAL HIGH (ref 4.8–5.6)
Mean Plasma Glucose: 116.89 mg/dL

## 2019-05-22 LAB — ECHOCARDIOGRAM COMPLETE
Height: 78 in
Weight: 2578.5 oz

## 2019-05-22 MED ORDER — ATORVASTATIN CALCIUM 40 MG PO TABS
40.0000 mg | ORAL_TABLET | Freq: Every day | ORAL | Status: DC
  Administered 2019-05-22: 40 mg via ORAL
  Filled 2019-05-22: qty 1

## 2019-05-22 MED ORDER — CLOPIDOGREL BISULFATE 75 MG PO TABS
300.0000 mg | ORAL_TABLET | Freq: Once | ORAL | Status: AC
  Administered 2019-05-22: 300 mg via ORAL
  Filled 2019-05-22: qty 4

## 2019-05-22 MED ORDER — IOHEXOL 350 MG/ML SOLN
75.0000 mL | Freq: Once | INTRAVENOUS | Status: AC
  Administered 2019-05-22: 75 mL via INTRAVENOUS

## 2019-05-22 MED ORDER — CLOPIDOGREL BISULFATE 75 MG PO TABS
75.0000 mg | ORAL_TABLET | Freq: Every day | ORAL | Status: DC
  Administered 2019-05-23: 75 mg via ORAL
  Filled 2019-05-22: qty 1

## 2019-05-22 NOTE — Progress Notes (Addendum)
PROGRESS NOTE    Brandon Summers   WGN:[id number]  DOB: 10/01/1971  DOA: 05/21/2019 PCP: Patient, No Pcp Per   Brief Narrative:  Brandon Summers is a 48 y/o with no known medical history who comes from home with complaints of blurry vision when he has both eyes open which resolved when he closes one eye. According to his wife, he had trouble holding objects, slurred speech and generalized weakness. He also reported chronic headaches.   CT head> unremarkable except for mucosal thickening in ethemoid sinuses MRI> 1. 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    Subjective: He states he is seeing double when both eyes are open. He has no other complaints.     Assessment & Plan:   Active Problems:   CVA (cerebral vascular accident)- ongoing double vision Neuro suspecting small vessel ischemic infarct - loaded with Plavix, now on 75 mg daily - CTA head and neck is pending - 2 D ECHO ordered and pending - LDL 114- start Statin - Hba1c is 5.7 - PT OT eval pending - has passed SLP eval  - diet started   Time spent in minutes: 35 DVT prophylaxis: Lovenox Code Status: Full code Family Communication:  Disposition Plan: f/u on neuro work up Consultants:   Neurology Procedures:     Antimicrobials:  Anti-infectives (From admission, onward)   None       Objective: Vitals:   05/22/19 0153 05/22/19 0315 05/22/19 0349 05/22/19 0723  BP: 125/85 124/82 118/88 112/75  Pulse: (!) 53 (!) 59  (!) 55  Resp: 16 16 16 12   Temp: 97.6 F (36.4 C) 98.1 F (36.7 C) 97.9 F (36.6 C) 98.2 F (36.8 C)  TempSrc: Oral Oral Oral Oral  SpO2: 100% 100% 100% 100%  Weight:      Height:       No intake or output data in the 24 hours ending 05/22/19 0856 Filed Weights   05/22/19 0148  Weight: 73.1 kg    Examination: General exam: Appears comfortable  HEENT: PERRLA, oral  mucosa moist, no sclera icterus or thrush Respiratory system: Clear to auscultation. Respiratory effort normal. Cardiovascular system: S1 & S2 heard, RRR.   Gastrointestinal system: Abdomen soft, non-tender, nondistended. Normal bowel sounds. Central nervous system: Alert and oriented. Double vision noted- No other focal neurological deficits. Extremities: No cyanosis, clubbing or edema Skin: No rashes or ulcers Psychiatry:  Mood & affect appropriate.     Data Reviewed: I have personally reviewed following labs and imaging studies  CBC: Recent Labs  Lab 05/21/19 1458 05/21/19 1507  WBC 7.3  --   NEUTROABS 4.4  --   HGB 16.7 18.4*  HCT 53.0* 54.0*  MCV 76.0*  --   PLT 226  --    Basic Metabolic Panel: Recent Labs  Lab 05/21/19 1458 05/21/19 1507  NA 141 139  K 4.4 4.3  CL 104 104  CO2 30  --   GLUCOSE 95 89  BUN 12 11  CREATININE 1.12 1.10  CALCIUM 9.0  --    GFR: Estimated Creatinine Clearance: 85.8 mL/min (by C-G formula based on SCr of 1.1 mg/dL). Liver Function Tests: Recent Labs  Lab 05/21/19 1458  AST 24  ALT 44  ALKPHOS 52  BILITOT 0.5  PROT 6.0*  ALBUMIN 3.3*   No results for input(s): LIPASE, AMYLASE in the last 168 hours. No results for input(s): AMMONIA in  the last 168 hours. Coagulation Profile: Recent Labs  Lab 05/21/19 1458  INR 0.8   Cardiac Enzymes: No results for input(s): CKTOTAL, CKMB, CKMBINDEX, TROPONINI in the last 168 hours. BNP (last 3 results) No results for input(s): PROBNP in the last 8760 hours. HbA1C: No results for input(s): HGBA1C in the last 72 hours. CBG: Recent Labs  Lab 05/21/19 1423  GLUCAP 136*   Lipid Profile: No results for input(s): CHOL, HDL, LDLCALC, TRIG, CHOLHDL, LDLDIRECT in the last 72 hours. Thyroid Function Tests: No results for input(s): TSH, T4TOTAL, FREET4, T3FREE, THYROIDAB in the last 72 hours. Anemia Panel: No results for input(s): VITAMINB12, FOLATE, FERRITIN, TIBC, IRON, RETICCTPCT in  the last 72 hours. Urine analysis: No results found for: COLORURINE, APPEARANCEUR, LABSPEC, PHURINE, GLUCOSEU, HGBUR, BILIRUBINUR, KETONESUR, PROTEINUR, UROBILINOGEN, NITRITE, LEUKOCYTESUR Sepsis Labs: @LABRCNTIP (procalcitonin:4,lacticidven:4) ) Recent Results (from the past 240 hour(s))  SARS Coronavirus 2 (CEPHEID - Performed in [facility] hospital lab), Hosp Order     Status: None   Collection Time: 05/21/19  7:13 PM   Specimen: Nasopharyngeal Swab  Result Value Ref Range Status   SARS Coronavirus 2 NEGATIVE NEGATIVE Final    Comment: (NOTE) If result is NEGATIVE SARS-CoV-2 target nucleic acids are NOT DETECTED. The SARS-CoV-2 RNA is generally detectable in upper and lower  respiratory specimens during the acute phase of infection. The lowest  concentration of SARS-CoV-2 viral copies this assay can detect is 250  copies / mL. A negative result does not preclude SARS-CoV-2 infection  and should not be used as the sole basis for treatment or other  patient management decisions.  A negative result may occur with  improper specimen collection / handling, submission of specimen other  than nasopharyngeal swab, presence of viral mutation(s) within the  areas targeted by this assay, and inadequate number of viral copies  (<250 copies / mL). A negative result must be combined with clinical  observations, patient history, and epidemiological information. If result is POSITIVE SARS-CoV-2 target nucleic acids are DETECTED. The SARS-CoV-2 RNA is generally detectable in upper and lower  respiratory specimens dur ing the acute phase of infection.  Positive  results are indicative of active infection with SARS-CoV-2.  Clinical  correlation with patient history and other diagnostic information is  necessary to determine patient infection status.  Positive results do  not rule out bacterial infection or co-infection with other viruses. If result is PRESUMPTIVE POSTIVE SARS-CoV-2 nucleic acids  MAY BE PRESENT.   A presumptive positive result was obtained on the submitted specimen  and confirmed on repeat testing.  While 2019 novel coronavirus  (SARS-CoV-2) nucleic acids may be present in the submitted sample  additional confirmatory testing may be necessary for epidemiological  and / or clinical management purposes  to differentiate between  SARS-CoV-2 and other Sarbecovirus currently known to infect humans.  If clinically indicated additional testing with an alternate test  methodology [phone]) is advised. The SARS-CoV-2 RNA is generally  detectable in upper and lower respiratory sp ecimens during the acute  phase of infection. The expected result is Negative. Fact Sheet for Patients:  BoilerBrush.com.cyhttps://www.fda.gov/media/136312/download Fact Sheet for Healthcare Providers: https://pope.com/https://www.fda.gov/media/136313/download This test is not yet approved or cleared by the [place] FDA and has been authorized for detection and/or diagnosis of SARS-CoV-2 by FDA under an Emergency Use Authorization (EUA).  This EUA will remain in effect (meaning this test can be used) for the duration of the COVID-19 declaration under Section 564(b)(1) of the Act, 21 U.S.C. section 360bbb-3(b)(1),  unless the authorization is terminated or revoked sooner. Performed at [facility], [place] [address], [place], [place] [address]          Radiology Studies: Dg Chest 2 View  Result Date: 05/22/2019 CLINICAL DATA:  48 year old male with altered mental status. EXAM: CHEST - 2 VIEW COMPARISON:  Chest radiograph dated 02/13/2010 FINDINGS: The heart size and mediastinal contours are within normal limits. Both lungs are clear. The visualized skeletal structures are unremarkable. IMPRESSION: No active cardiopulmonary disease. Electronically Signed   By: Anner Crete M.D.   On: 05/22/2019 00:45   Ct Head Wo Contrast  Result Date: 05/21/2019 CLINICAL DATA:  Headache and confusion with blurred  vision EXAM: CT HEAD WITHOUT CONTRAST TECHNIQUE: Contiguous axial images were obtained from the base of the skull through the vertex without intravenous contrast. COMPARISON:  None. FINDINGS: Brain: The ventricles are normal in size and configuration. There is no intracranial mass, hemorrhage, extra-axial fluid collection, or midline shift. The brain parenchyma appears unremarkable. No acute infarct evident. Vascular: No hyperdense vessel. No vascular calcifications are appreciable. Skull: The bony calvarium appears intact. Sinuses/Orbits: There is mild mucosal thickening in several ethmoid air cells. Other visualized paranasal sinuses are clear. Orbits appear symmetric bilaterally. Other: Mastoid air cells are clear. IMPRESSION: Mild mucosal thickening in several ethmoid air cells. Study otherwise unremarkable. Electronically Signed   By: Lowella Grip III M.D.   On: 05/21/2019 15:34   Mr Brain Wo Contrast  Result Date: 05/21/2019 CLINICAL DATA:  Initial evaluation for acute blurry vision, headache. EXAM: MRI HEAD WITHOUT CONTRAST TECHNIQUE: Multiplanar, multiecho pulse sequences of the brain and surrounding structures were obtained without intravenous contrast. COMPARISON:  Prior CT from earlier the same day. FINDINGS: Brain: Examination moderately degraded by motion artifact. Cerebral volume within normal limits for age. Minimal chronic microvascular ischemic changes noted within the periventricular white matter. 1.3 x 1.5 x 2.2 cm acute ischemic nonhemorrhagic infarcts seen involving the ventral medial left thalamus (series 3, image 28). No associated mass effect. No other evidence for acute or subacute ischemia. Gray-white matter differentiation otherwise maintained. No other areas of chronic infarction. No foci of susceptibility artifact to suggest acute or chronic intracranial hemorrhage. No mass lesion, midline shift or mass effect. No hydrocephalus. No extra-axial fluid collection. Pituitary gland  within normal limits. Vascular: Major intracranial vascular flow voids are maintained. Skull and upper cervical spine: Craniocervical junction within normal limits. Upper cervical spine normal. Bone marrow signal intensity within normal limits. No scalp soft tissue abnormality. Sinuses/Orbits: Globes and orbital soft tissues within normal limits. Paranasal sinuses are clear. No mastoid effusion. Other: None. IMPRESSION: 1. 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 2. Underlying mild chronic microvascular ischemic disease for age. Electronically Signed   By: Jeannine Boga M.D.   On: 05/21/2019 18:31      Scheduled Meds:  clopidogrel  300 mg Oral Once   [START ON 05/23/2019] clopidogrel  75 mg Oral Daily   enoxaparin (LOVENOX) injection  40 mg Subcutaneous Daily   Continuous Infusions:   LOS: 1 day      Debbe Odea, MD [facility] Pager: [email or web address] Password [facility] 05/22/2019, 8:56 AM

## 2019-05-22 NOTE — ED Notes (Signed)
[facility] is at bedside to transport patient.

## 2019-05-22 NOTE — ED Notes (Signed)
Gave report to Jennelle Human, Therapist, sports for [employer] [phone] and notified [facility] transportation.

## 2019-05-22 NOTE — Progress Notes (Signed)
Rehab Admissions Coordinator Note:  Per PT and OT recommendation, this patient was screened by Jhonnie Garner for appropriateness for an Inpatient Acute Rehab Consult.  At this time, we are recommending Inpatient Rehab consult. AC will contact MD to request order.   Jhonnie Garner 05/22/2019, 1:42 PM  I can be reached at [phone].

## 2019-05-22 NOTE — Progress Notes (Signed)
  Speech Language Pathology Treatment: Cognitive-Linquistic(Aphasia)  Patient Details Name: Brandon Summers MRN: [id number] DOB: 1971-04-14 Today's Date: 05/22/2019 Time: 1660-6301 SLP Time Calculation (min) (ACUTE ONLY): 24 min  Assessment / Plan / Recommendation Clinical Impression  Pt was seen for aphasia treatment and was cooperative throughout the session. He repeatedly expressed concern regarding his visual deficits and his ability see clearly when he convers each eye. He achieved 60% accuracy with responsive naming when he used gestures increasing to 100% with min-mod cues. He demonstrated 0% accuracy with sentence completion despite max cues and appeared to be confused by the task. He responded to complex yes/no questions with 70% accuracy increasing 90% with rephrasing. He was able to provide a max of 4 items per category during concrete divergent naming when mod verbal cues were provided. SLP will continue to follow pt.    HPI HPI: Brandon Summers is a 48 y.o. male with no known medical history presents to the emergency department for evaluation of blurry vision and difficulty holding objects, slurred speech and general weakness.  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      SLP Plan  Continue with current plan of care  Patient needs continued Speech Lanaguage Pathology Services    Recommendations                   Follow up Recommendations: Inpatient Rehab SLP Visit Diagnosis: Aphasia (R47.01) Plan: Continue with current plan of care       Kerry Chisolm I. Hardin Negus, [place], [place] Office number [phone] Pager [place] 05/22/2019, 3:03 PM

## 2019-05-22 NOTE — Evaluation (Signed)
Physical Therapy Evaluation Patient Details Name: Brandon Summers MRN: [id number] DOB: 09-19-1971 Today's Date: 05/22/2019   History of Present Illness  Patient is a 48 y/o male presenting to the ED on 05/21/2019 with primary complaints of AMS and blurred vision. CT head unremarkable except for mucosal thickening in ethemoid sinuses. MRI revealing 1. 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 No significant PMH.     Clinical Impression  Patient admitted with the above listed diagnosis. Patient independent with ADLs and mobility prior to admission. Patient today requiring close min guard to Min A for balance and steady with gait and dynamic activities. Patient with difficulty following some simple commands - unsure of due to language? Most unsteadiness with quick turns as well as horizontal and vertical head turns due to blurred/double vision. Will currently recommend CIR level therapies at discharge to progress safe and independent functional mobility. PT to follow acutely.      Follow Up Recommendations CIR    Equipment Recommendations  None recommended by PT    Recommendations for Other Services Rehab consult     Precautions / Restrictions Precautions Precautions: Fall Restrictions Weight Bearing Restrictions: No      Mobility  Bed Mobility Overal bed mobility: Needs Assistance Bed Mobility: Supine to Sit;Sit to Supine     Supine to sit: Supervision Sit to supine: Supervision   General bed mobility comments: increased time - supervision assist for linens  Transfers Overall transfer level: Needs assistance Equipment used: None Transfers: Sit to/from Stand;Stand Pivot Transfers Sit to Stand: Min guard Stand pivot transfers: Min guard       General transfer comment: close min guard for balance and steadying  Ambulation/Gait Ambulation/Gait assistance: Min guard;Min assist Gait  Distance (Feet): 150 Feet Assistive device: None Gait Pattern/deviations: Step-through pattern;Decreased stride length;Drifts right/left Gait velocity: decreased   General Gait Details: difficulty with turning quickly as well as environment scanning - tends to close 1 eye while ambulating  Stairs            Wheelchair Mobility    Modified Rankin (Stroke Patients Only) Modified Rankin (Stroke Patients Only) Pre-Morbid Rankin Score: No symptoms Modified Rankin: Moderately severe disability     Balance Overall balance assessment: Needs assistance Sitting-balance support: No upper extremity supported;Feet supported Sitting balance-Leahy Scale: Good     Standing balance support: No upper extremity supported;During functional activity Standing balance-Leahy Scale: Fair                   Standardized Balance Assessment Standardized Balance Assessment : Dynamic Gait Index   Dynamic Gait Index Level Surface: Mild Impairment Change in Gait Speed: Mild Impairment Gait with Horizontal Head Turns: Moderate Impairment Gait with Vertical Head Turns: Moderate Impairment Gait and Pivot Turn: Moderate Impairment Step Over Obstacle: Moderate Impairment(seemingly doesnt understand instructions) Step Around Obstacles: Mild Impairment Steps: Mild Impairment Total Score: 12       Pertinent Vitals/Pain Pain Assessment: No/denies pain    Home Living Family/patient expects to be discharged to:: Private residence Living Arrangements: Children Available Help at Discharge: Family Type of Home: Apartment(3rd floor) Home Access: Stairs to enter   Entrance Stairs-Number of Steps: 2 flights Home Layout: One level Home Equipment: None      Prior Function Level of Independence: Independent               Hand Dominance   Dominant Hand: Right    Extremity/Trunk Assessment  Upper Extremity Assessment Upper Extremity Assessment: Defer to OT evaluation    Lower  Extremity Assessment Lower Extremity Assessment: Generalized weakness    Cervical / Trunk Assessment Cervical / Trunk Assessment: Normal  Communication   Communication: Prefers language other than English  Cognition Arousal/Alertness: Awake/alert Behavior During Therapy: WFL for tasks assessed/performed Overall Cognitive Status: Impaired/Different from baseline Area of Impairment: Orientation;Following commands;Safety/judgement;Problem solving                 Orientation Level: Disoriented to;Place;Situation     Following Commands: Follows one step commands with increased time;Follows multi-step commands inconsistently Safety/Judgement: Decreased awareness of safety;Decreased awareness of deficits   Problem Solving: Slow processing;Difficulty sequencing;Requires verbal cues General Comments: some difficulty following simple commands; unsure if this is due to language or cognitive deficits?      General Comments      Exercises     Assessment/Plan    PT Assessment Patient needs continued PT services  PT Problem List Decreased strength;Decreased activity tolerance;Decreased balance;Decreased mobility;Decreased coordination;Decreased cognition;Decreased knowledge of use of DME;Decreased safety awareness       PT Treatment Interventions DME instruction;Gait training;Stair training;Therapeutic activities;Functional mobility training;Therapeutic exercise;Neuromuscular re-education;Balance training;Patient/family education    PT Goals (Current goals can be found in the Care Plan section)  Acute Rehab PT Goals Patient Stated Goal: none stated PT Goal Formulation: With patient Time For Goal Achievement: 06/05/19 Potential to Achieve Goals: Good    Frequency Min 4X/week   Barriers to discharge        Co-evaluation               AM-PAC PT "6 Clicks" Mobility  Outcome Measure Help needed turning from your back to your side while in a flat bed without using  bedrails?: A Little Help needed moving from lying on your back to sitting on the side of a flat bed without using bedrails?: A Little Help needed moving to and from a bed to a chair (including a wheelchair)?: A Little Help needed standing up from a chair using your arms (e.g., wheelchair or bedside chair)?: A Little Help needed to walk in hospital room?: A Little Help needed climbing 3-5 steps with a railing? : A Little 6 Click Score: 18    End of Session Equipment Utilized During Treatment: Gait belt Activity Tolerance: Patient tolerated treatment well Patient left: in bed;with call bell/phone within reach Nurse Communication: Mobility status PT Visit Diagnosis: Unsteadiness on feet (R26.81);Other abnormalities of gait and mobility (R26.89);Muscle weakness (generalized) (M62.81)    Time: [id number] PT Time Calculation (min) (ACUTE ONLY): 16 min   Charges:   PT Evaluation $PT Eval Low Complexity: 1 Low           Kipp LaurenceStephanie R Aaron, PT, DPT Supplemental Physical Therapist 05/22/19 12:18 PM Pager: [phone] Office: [phone]

## 2019-05-22 NOTE — Evaluation (Signed)
Occupational Therapy Evaluation Patient Details Name: Brandon Summers MRN: [id number] DOB: 01/18/1971 Today's Date: 05/22/2019    History of Present Illness Patient is a 48 y/o male presenting to the ED on 05/21/2019 with primary complaints of AMS and blurred vision. CT head unremarkable except for mucosal thickening in ethemoid sinuses. MRI revealing 1. 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 No significant PMH.    Clinical Impression   Pt admitted with above and presents to OT with deficits impacting ability to complete ADLs and IADLs at [facility].  Min guard for dynamic standing balance during toilet transfers and toileting, as well as simulated tub/shower transfer.  Pt reports double vision when looking near and far, predominantly in Lt visual field upon further assessment.  Noted mild Rt inattention during functional mobility, requiring cues to attend to Rt environment.  Also noted impaired cognition with decreased short term memory/recall and mild impairments with following directions.  Pt will benefit from OT acutely and recommend CIR to increase safety and independence with ADLs and IADLs.    Follow Up Recommendations  CIR;Supervision/Assistance - 24 hour    Equipment Recommendations  Other (comment)(TBD)    Recommendations for Other Services Rehab consult     Precautions / Restrictions Precautions Precautions: Fall Restrictions Weight Bearing Restrictions: No      Mobility Bed Mobility Overal bed mobility: Needs Assistance Bed Mobility: Supine to Sit;Sit to Supine     Supine to sit: Supervision Sit to supine: Supervision   General bed mobility comments: increased time - assist for linens  Transfers Overall transfer level: Needs assistance Equipment used: None Transfers: Sit to/from Stand;Stand Pivot Transfers Sit to Stand: Min guard Stand pivot transfers: Min guard        General transfer comment: close min guard for balance and steadying    Balance Overall balance assessment: Needs assistance Sitting-balance support: No upper extremity supported;Feet supported Sitting balance-Leahy Scale: Good     Standing balance support: No upper extremity supported;During functional activity Standing balance-Leahy Scale: Fair                   Standardized Balance Assessment Standardized Balance Assessment : Dynamic Gait Index   Dynamic Gait Index Level Surface: Mild Impairment Change in Gait Speed: Mild Impairment Gait with Horizontal Head Turns: Moderate Impairment Gait with Vertical Head Turns: Moderate Impairment Gait and Pivot Turn: Moderate Impairment Step Over Obstacle: Moderate Impairment(seemingly doesnt understand instructions) Step Around Obstacles: Mild Impairment Steps: Mild Impairment Total Score: 12     ADL either performed or assessed with clinical judgement   ADL Overall ADL's : Needs assistance/impaired     Grooming: Wash/dry hands;Wash/dry face;Supervision/safety;Set up;Standing   Upper Body Bathing: Min guard;Standing   Lower Body Bathing: Min guard;Sit to/from stand   Upper Body Dressing : Set up;Sitting   Lower Body Dressing: Min guard;Sit to/from stand   Toilet Transfer: Min guard   Toileting- Water quality scientist and Hygiene: Minimal assistance;Sit to/from stand   Tub/ Banker: Minimal Museum/gallery conservator Details (indicate cue type and reason): simulated by stepping over high threshold Functional mobility during ADLs: Min guard       Vision Baseline Vision/History: No visual deficits Patient Visual Report: Diplopia Vision Assessment?: Yes Eye Alignment: Within Functional Limits Ocular Range of Motion: Within Functional Limits Alignment/Gaze Preference: Within Defined Limits Tracking/Visual Pursuits: Decreased smoothness of horizontal tracking;Decreased smoothness of vertical  tracking Saccades: Decreased speed of saccadic movement Diplopia  Assessment: Disappears with one eye closed;Only with left gaze     Perception  mild Rt inattention       Pertinent Vitals/Pain Pain Assessment: No/denies pain     Hand Dominance Right   Extremity/Trunk Assessment Upper Extremity Assessment Upper Extremity Assessment: Generalized weakness   Lower Extremity Assessment Lower Extremity Assessment: Generalized weakness   Cervical / Trunk Assessment Cervical / Trunk Assessment: Normal   Communication Communication Communication: Prefers language other than English   Cognition Arousal/Alertness: Awake/alert Behavior During Therapy: WFL for tasks assessed/performed Overall Cognitive Status: Impaired/Different from baseline Area of Impairment: Orientation;Following commands;Safety/judgement;Problem solving;Memory                 Orientation Level: Disoriented to;Place;Situation   Memory: Decreased short-term memory(able to recall 3/3 immediate recall and 1 of 3 words after 5 min) Following Commands: Follows one step commands with increased time;Follows multi-step commands inconsistently Safety/Judgement: Decreased awareness of safety;Decreased awareness of deficits   Problem Solving: Slow processing;Difficulty sequencing;Requires verbal cues General Comments: some difficulty following simple commands; unsure if this is due to language or cognitive deficits?              Home Living Family/patient expects to be discharged to:: Private residence Living Arrangements: Children Available Help at Discharge: Family Type of Home: Apartment(3rd floor) Home Access: Stairs to enter Entrance Stairs-Number of Steps: 2 flights   Home Layout: One level     Bathroom Shower/Tub: Chief Strategy OfficerTub/shower unit   Bathroom Toilet: Standard     Home Equipment: None          Prior Functioning/Environment Level of Independence: Independent                 OT Problem  List: Decreased strength;Decreased activity tolerance;Impaired balance (sitting and/or standing);Impaired vision/perception;Decreased coordination;Decreased cognition;Decreased safety awareness;Decreased knowledge of precautions      OT Treatment/Interventions: Visual/perceptual remediation/compensation;Self-care/ADL training;Neuromuscular education;Therapeutic activities;Cognitive remediation/compensation;Balance training    OT Goals(Current goals can be found in the care plan section) Acute Rehab OT Goals Patient Stated Goal: none stated OT Goal Formulation: With patient Time For Goal Achievement: 06/05/19 Potential to Achieve Goals: Good  OT Frequency: Min 2X/week   Barriers to D/C: Inaccessible home environment  lives in 3rd floor apartment/condo          AM-PAC OT "6 Clicks" Daily Activity     Outcome Measure Help from another person eating meals?: None Help from another person taking care of personal grooming?: None Help from another person toileting, which includes using toliet, bedpan, or urinal?: A Little Help from another person bathing (including washing, rinsing, drying)?: A Little Help from another person to put on and taking off regular upper body clothing?: None Help from another person to put on and taking off regular lower body clothing?: A Little 6 Click Score: 21   End of Session Equipment Utilized During Treatment: Gait belt Nurse Communication: Mobility status  Activity Tolerance: Patient tolerated treatment well;No increased pain Patient left: in bed;with call bell/phone within reach;with bed alarm set  OT Visit Diagnosis: Unsteadiness on feet (R26.81);Muscle weakness (generalized) (M62.81);Other symptoms and signs involving the nervous system (R29.898)                Time: [id number] OT Time Calculation (min): 21 min Charges:  OT General Charges $OT Visit: 1 Visit OT Evaluation $OT Eval Low Complexity: 1 Low   Rosalio LoudHOXIE, Rube Sanchez, [phone] 05/22/2019, 12:46  PM

## 2019-05-22 NOTE — Consult Note (Signed)
Neurology Consultation Reason for Consult: Stroke Referring Physician: Hugelmeyer, a  CC: Blurred vision  History is obtained from: Patient, chart review  HPI: Brandon Summers is a 48 y.o. male who presented to the emergency department with blurred vision.  He states that it started Monday evening, and when it was not getting better he came to the emergency room at [facility].  There an MRI was performed which demonstrates a fairly large left thalamic stroke.  There is somewhat of a language barrier, and so I am not certain of some of his responses, specifically if he has had difficulty with speech.  He does seem slightly confused.  He also is complaining of bifrontal headache.  He denies numbness or weakness.   LKW: Monday tpa given?: no, out of window  ROS: A 14 point ROS was performed and is negative except as noted in the HPI.   History reviewed. No pertinent past medical history.   Family History  Problem Relation Age of Onset  . Hypertension Mother   . Stroke Mother      Social History:  reports that he has been smoking cigarettes. He has been smoking about 0.50 packs per day. He has never used smokeless tobacco. He reports current alcohol use. He reports that he does not use drugs.   Exam: Current vital signs: BP 118/88 (BP Location: Right Arm)   Pulse (!) 59   Temp 97.9 F (36.6 C) (Oral)   Resp 16   Ht 6\' 6"  (1.981 m)   Wt 73.1 kg   SpO2 100%   BMI 18.62 kg/m  Vital signs in last 24 hours: Temp:  [97.6 F (36.4 C)-98.4 F (36.9 C)] 97.9 F (36.6 C) (06/24 0349) Pulse Rate:  [50-63] 59 (06/24 0315) Resp:  [12-22] 16 (06/24 0349) BP: (109-144)/(74-99) 118/88 (06/24 0349) SpO2:  [97 %-100 %] 100 % (06/24 0349) Weight:  [73.1 kg] 73.1 kg (06/24 0148)   Physical Exam  Constitutional: Appears well-developed and well-nourished.  Psych: Affect appropriate to situation Eyes: No scleral injection HENT: No OP obstrucion Head: Normocephalic.  Cardiovascular:  Normal rate and regular rhythm.  Respiratory: Effort normal, non-labored breathing GI: Soft.  No distension. There is no tenderness.  Skin: WDI  Neuro: Mental Status: Patient is lethargic, but I did wake him from sleep oriented to person, place, gives the year initially is 66, then corrects himself to 20, gives the month as March He mumbles a lot of his answers, actually seems to communicate slightly better in [place]. Cranial Nerves: II: Visual Fields are full. Pupils are equal, round, and reactive to light.   III,IV, VI: His lateral gaze is normal, every time I try to convince him to look up, he closes his eyes V: Facial sensation is symmetric to temperature VII: Facial movement with mild right facial weakness VIII: hearing is intact to voice X: Uvula elevates symmetrically XI: Shoulder shrug is symmetric. XII: tongue is midline without atrophy or fasciculations.  Motor: Tone is normal. Bulk is normal. 5/5 strength was present in all four extremities.  Sensory: Sensation is symmetric to light touch and temperature in the arms and legs. Cerebellar: He does not have any clear ataxia, though he does not cooperate well with testing.     I have reviewed labs in epic and the results pertinent to this consultation are: CMP-unremarkable  I have reviewed the images obtained: MRI brain-relatively large left leg infarct  Impression: 47 year old male with what is likely small vessel ischemic infarct.  He  will need to be assessed for secondary risk factor modification and therapy.  Also with a history of headaches, will get CTA to assess for vasculitic process, but I think this is relatively unlikely.  Recommendations: - HgbA1c, fasting lipid panel - Frequent neuro checks - Echocardiogram -CTA head neck- Prophylactic therapy-aspirin 81 mg Plavix 75 mg following 300 mg load  - Risk factor modification - Telemetry monitoring - PT consult, OT consult, Speech consult - Stroke team to  follow   Ritta SlotMcNeill , MD [facility] [phone]  If 7pm- 7am, please page neurology on call as listed in AMION.

## 2019-05-22 NOTE — Progress Notes (Signed)
STROKE TEAM PROGRESS NOTE  HPI:( Dr Amada JupiterKirkpatrick ) Brandon Summers is a 48 y.o. male who presented to the emergency department with blurred vision.  He states that it started Monday evening, and when it was not getting better [facility].  There an MRI was performed which demonstrates a fairly large left thalamic stroke.  There is somewhat of a language barrier, and so I am not certain of some of his responses, specifically if he has had difficulty with speech.  He does seem slightly confused.  He also is complaining of bifrontal headache.  He denies numbness or weakness. LKW: Monday 05/20/2019 tpa given?: no, out of window  INTERVAL HISTORY I have personally reviewed history of presenting illness with the patient.  He states he had difficulty speaking and mild confusion and vision difficulties and bifrontal headache.  He feels slightly better today.  MRI scan shows a large 2.2 cm left thalamic infarct.  He denies any prior history of strokes TIAs or significant neurological problems.  Vitals:   05/22/19 0315 05/22/19 0349 05/22/19 0723 05/22/19 1119  BP: 124/82 118/88 112/75 (!) 138/109  Pulse: (!) 59  (!) 55 (!) 58  Resp: 16 16 12 14   Temp: 98.1 F (36.7 C) 97.9 F (36.6 C) 98.2 F (36.8 C) 98 F (36.7 C)  TempSrc: Oral Oral Oral Oral  SpO2: 100% 100% 100% 100%  Weight:      Height:        CBC:  Recent Labs  Lab 05/21/19 1458 05/21/19 1507  WBC 7.3  --   NEUTROABS 4.4  --   HGB 16.7 18.4*  HCT 53.0* 54.0*  MCV 76.0*  --   PLT 226  --     Basic Metabolic Panel:  Recent Labs  Lab 05/21/19 1458 05/21/19 1507  NA 141 139  K 4.4 4.3  CL 104 104  CO2 30  --   GLUCOSE 95 89  BUN 12 11  CREATININE 1.12 1.10  CALCIUM 9.0  --    Lipid Panel:     Component Value Date/Time   CHOL 170 05/22/2019 0915   TRIG 61 05/22/2019 0915   HDL 44 05/22/2019 0915   CHOLHDL 3.9 05/22/2019 0915   VLDL 12 05/22/2019 0915   LDLCALC 114 (H) 05/22/2019 0915    HgbA1c:  Lab Results  Component Value Date   HGBA1C 5.7 (H) 05/22/2019   Urine Drug Screen: No results found for: LABOPIA, COCAINSCRNUR, LABBENZ, AMPHETMU, THCU, LABBARB  Alcohol Level No results found for: ETH  IMAGING Ct Angio Head W Or Wo Contrast  Result Date: 05/22/2019 CLINICAL DATA:  Stroke follow-up.  Headache today EXAM: CT ANGIOGRAPHY HEAD AND NECK TECHNIQUE: Multidetector CT imaging of the head and neck was performed using the standard protocol during bolus administration of intravenous contrast. Multiplanar CT image reconstructions and MIPs were obtained to evaluate the vascular anatomy. Carotid stenosis measurements (when applicable) are obtained utilizing NASCET criteria, using the distal internal carotid diameter as the denominator. CONTRAST:  75mL OMNIPAQUE IOHEXOL 350 MG/ML SOLN COMPARISON:  Brain MRI from yesterday FINDINGS: CTA NECK FINDINGS Aortic arch: Normal. Right carotid system: Vessels are smooth and widely patent. No atheromatous changes. Left carotid system: Vessels are smooth and widely patent. No atheromatous changes. Vertebral arteries: No proximal subclavian or vertebral stenosis or beading. Skeleton: No acute or aggressive finding Other neck: Negative Upper chest: Mild centrilobular emphysema. Presumed mucus in the trachea. Review of the MIP images confirms the  above findings CTA HEAD FINDINGS Anterior circulation: Negative for branch occlusion, beading, stenosis, or calcified plaque. Negative for aneurysm Posterior circulation: Vertebral and basilar arteries are smooth and widely patent. 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 Negative for branch occlusion or flow limiting stenosis. Negative for vessel beading. Negative for aneurysm (when accounting for tortuosity of the proximal left PICA as seen 3D renderings). Venous sinuses: Patent Anatomic variants: As above Delayed phase: At obtained Review of the MIP images confirms the  above findings IMPRESSION: 1. Negative CTA of the head and neck. 2. Mild centrilobular emphysema. Electronically Signed   By: Marnee SpringJonathon  Watts M.D.   On: 05/22/2019 11:34   Dg Chest 2 View  Result Date: 05/22/2019 CLINICAL DATA:  48 year old male with altered mental status. EXAM: CHEST - 2 VIEW COMPARISON:  Chest radiograph dated 02/13/2010 FINDINGS: The heart size and mediastinal contours are within normal limits. Both lungs are clear. The visualized skeletal structures are unremarkable. IMPRESSION: No active cardiopulmonary disease. Electronically Signed   By: Elgie CollardArash  Radparvar M.D.   On: 05/22/2019 00:45   Ct Head Wo Contrast  Result Date: 05/21/2019 CLINICAL DATA:  Headache and confusion with blurred vision EXAM: CT HEAD WITHOUT CONTRAST TECHNIQUE: Contiguous axial images were obtained from the base of the skull through the vertex without intravenous contrast. COMPARISON:  None. FINDINGS: Brain: The ventricles are normal in size and configuration. There is no intracranial mass, hemorrhage, extra-axial fluid collection, or midline shift. The brain parenchyma appears unremarkable. No acute infarct evident. Vascular: No hyperdense vessel. No vascular calcifications are appreciable. Skull: The bony calvarium appears intact. Sinuses/Orbits: There is mild mucosal thickening in several ethmoid air cells. Other visualized paranasal sinuses are clear. Orbits appear symmetric bilaterally. Other: Mastoid air cells are clear. IMPRESSION: Mild mucosal thickening in several ethmoid air cells. Study otherwise unremarkable. Electronically Signed   By: Bretta BangWilliam  Woodruff III M.D.   On: 05/21/2019 15:34   Ct Angio Neck W Or Wo Contrast  Result Date: 05/22/2019 CLINICAL DATA:  Stroke follow-up.  Headache today EXAM: CT ANGIOGRAPHY HEAD AND NECK TECHNIQUE: Multidetector CT imaging of the head and neck was performed using the standard protocol during bolus administration of intravenous contrast. Multiplanar CT image  reconstructions and MIPs were obtained to evaluate the vascular anatomy. Carotid stenosis measurements (when applicable) are obtained utilizing NASCET criteria, using the distal internal carotid diameter as the denominator. CONTRAST:  75mL OMNIPAQUE IOHEXOL 350 MG/ML SOLN COMPARISON:  Brain MRI from yesterday FINDINGS: CTA NECK FINDINGS Aortic arch: Normal. Right carotid system: Vessels are smooth and widely patent. No atheromatous changes. Left carotid system: Vessels are smooth and widely patent. No atheromatous changes. Vertebral arteries: No proximal subclavian or vertebral stenosis or beading. Skeleton: No acute or aggressive finding Other neck: Negative Upper chest: Mild centrilobular emphysema. Presumed mucus in the trachea. Review of the MIP images confirms the above findings CTA HEAD FINDINGS Anterior circulation: Negative for branch occlusion, beading, stenosis, or calcified plaque. Negative for aneurysm Posterior circulation: Vertebral and basilar arteries are smooth and widely patent. 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 Negative for branch occlusion or flow limiting stenosis. Negative for vessel beading. Negative for aneurysm (when accounting for tortuosity of the proximal left PICA as seen 3D renderings). Venous sinuses: Patent Anatomic variants: As above Delayed phase: At obtained Review of the MIP images confirms the above findings IMPRESSION: 1. Negative CTA of the head and neck. 2. Mild centrilobular emphysema. Electronically Signed  By: Marnee SpringJonathon  Watts M.D.   On: 05/22/2019 11:34   Mr Brain Wo Contrast  Result Date: 05/21/2019 CLINICAL DATA:  Initial evaluation for acute blurry vision, headache. EXAM: MRI HEAD WITHOUT CONTRAST TECHNIQUE: Multiplanar, multiecho pulse sequences of the brain and surrounding structures were obtained without intravenous contrast. COMPARISON:  Prior CT from earlier the same day. FINDINGS: Brain: Examination moderately  degraded by motion artifact. Cerebral volume within normal limits for age. Minimal chronic microvascular ischemic changes noted within the periventricular white matter. 1.3 x 1.5 x 2.2 cm acute ischemic nonhemorrhagic infarcts seen involving the ventral medial left thalamus (series 3, image 28). No associated mass effect. No other evidence for acute or subacute ischemia. Gray-white matter differentiation otherwise maintained. No other areas of chronic infarction. No foci of susceptibility artifact to suggest acute or chronic intracranial hemorrhage. No mass lesion, midline shift or mass effect. No hydrocephalus. No extra-axial fluid collection. Pituitary gland within normal limits. Vascular: Major intracranial vascular flow voids are maintained. Skull and upper cervical spine: Craniocervical junction within normal limits. Upper cervical spine normal. Bone marrow signal intensity within normal limits. No scalp soft tissue abnormality. Sinuses/Orbits: Globes and orbital soft tissues within normal limits. Paranasal sinuses are clear. No mastoid effusion. Other: None. IMPRESSION: 1. 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 2. Underlying mild chronic microvascular ischemic disease for age. Electronically Signed   By: Rise MuBenjamin  McClintock M.D.   On: 05/21/2019 18:31    PHYSICAL EXAM Pleasant middle-aged African male not in distress. . Afebrile. Head is nontraumatic. Neck is supple without bruit.    Cardiac exam no murmur or gallop. Lungs are clear to auscultation. Distal pulses are well felt. Neurological Exam ;  Awake  Alert oriented x 3.  Slow hesitant slightly nonfluent speech and often mumbles some answers.  Able to name repeat and comprehend well.  Eye movements full without nystagmus.fundi were not visualized. Vision acuity and fields appear normal. Hearing is normal. Palatal movements are normal. Face asymmetric.  With mild right lower facial weakness.   Tongue midline. Normal strength, tone, reflexes and coordination except diminished fine finger movements on the right and orbits left over right upper extremity.. Normal sensation. Gait deferred.  ASSESSMENT/PLAN Mr. Brandon Miumadou Desilva is a 48 y.o. male with no known past medical history presenting with blurred vision, possible confusion in setting of headache.   Stroke:   L thalamic infarct  - possibly small vessel disease but cannot rule out embolic source d/t large size  CT head mild mucosal thickening in ethmoid air cells.  Otherwise unremarkable.  MRI  L thalamic infarct. Significant small vessel disease for age.   CTA head & neck Unremarkable. Mild centrilobar emphysema  Transcranial Doppler with bubble pending  2D Echo pending  TEE and loop to look for embolic source. Scheduled with cardiology for tomorrow  LDL 114  HgbA1c 5.7  UDS pending   Lovenox 40 mg sq daily for VTE prophylaxis  No antithrombotic prior to admission, now on clopidogrel 75 mg daily following aspirin and Plavix load.  Patient has a documented history to aspirin.  Therapy recommendations:  CIR. consult placed  Disposition:  pending   Consider [facility] trial.  [facility] will follow up for possible enrollment.  Hyperlipidemia  Home meds:  No statin  Now on lipitor 40  LDL 114, goal < 70  Continue statin at discharge  Other Stroke Risk Factors  Cigarette smoker-half pack per day, advised to  stop smoking  ETOH use, advised to drink no more than 2 drink(s) a day  Family hx stroke (mother)  Hospital day # 1  I have personally obtained history,examined this patient, reviewed notes, independently viewed imaging studies, participated in medical decision making and plan of care.ROS completed by me personally and pertinent positives fully documented  I have made any additions or clarifications directly to the above note.   He presented with subtle speech difficulties  likely from large left thalamic infarct of cryptogenic etiology.  Recommend continue ongoing stroke work-up.  Aspirin and Plavix for 3 weeks followed by aspirin alone.  Patient counseled to quit smoking cigarettes and is willing.  Add statin for elevated lipids.  Check 2D echo and if unremarkable TEE and loop recorder tomorrow.  Discussed with Dr. Wynelle Cleveland.  Greater than 50% time during this 35-minute visit was spent on counseling and coordination of care about his cryptogenic stroke and answering questions. Antony Contras, MD Medical Director [facility] Pager: [phone] 05/22/2019 3:42 PM   To contact Stroke Continuity provider, please refer to [email or web address] After hours, contact General Neurology

## 2019-05-22 NOTE — Progress Notes (Signed)
Echocardiogram 2D Echocardiogram has been performed.  Matilde Bash 05/22/2019, 10:31 AM

## 2019-05-22 NOTE — Progress Notes (Signed)
PT Cancellation Note  Patient Details Name: Brandon Summers MRN: [id number] DOB: Oct 24, 1971   Cancelled Treatment:    Reason Eval/Treat Not Completed: Active bedrest order Will need activity orders. Will follow.   Lanney Gins, PT, DPT Supplemental Physical Therapist 05/22/19 8:02 AM Pager: [phone] Office: [phone]

## 2019-05-22 NOTE — Progress Notes (Signed)
    [facility] has been requested to perform a transesophageal echocardiogram on [facility] for stroke.  After careful review of history and examination, the risks and benefits of transesophageal echocardiogram have been explained including risks of esophageal damage, perforation (1:10,000 risk), bleeding, pharyngeal hematoma as well as other potential complications associated with conscious sedation including aspiration, arrhythmia, respiratory failure and death. Alternatives to treatment were discussed, questions were answered. Patient is willing to proceed.   Kathyrn Drown, NP  05/22/2019 3:42 PM

## 2019-05-22 NOTE — Evaluation (Signed)
Speech Language Pathology Evaluation Patient Details Name: Brandon Summers MRN: [id number] DOB: 04/27/1971 Today's Date: 05/22/2019 Time: [id number] SLP Time Calculation (min) (ACUTE ONLY): 24 min  Problem List:  Patient Active Problem List   Diagnosis Date Noted  . CVA (cerebral vascular accident) (HCC) 05/21/2019   Past Medical History: History reviewed. No pertinent past medical history. Past Surgical History: History reviewed. No pertinent surgical history. HPI:  Brandon Summers is a 48 y.o. male with no known medical history presents to the emergency department for evaluation of blurry vision and difficulty holding objects, slurred speech and general weakness.  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   Assessment / Plan / Recommendation Clinical Impression  Pt reported that he was living independently prior to admission without any deficits in speech, language or cognition. 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 Pt is originally from [place] but stated that he has been in the [place] since 1998 and was a fluent [place] speaker prior to admission; he therefore requested that the evaluation be completed in [place].   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 Receptively, he was able to follow 2-step commands and answer simple yes/no questions but demonstrated difficulty beyond this level related to his ability to complete 3-step commands and comprehend paragraph-level material. Concerning expressive language, pt was able to demonstrate confrontational naming but exhibited difficulty with responsive naming and word retrieval which negatively impacted sentence formulation. Pt did demonstrate diffculty with memory, attention, and problem solving but the  impact of his receptive language impairments on his cognitive-linguistic performance is considered. Skilled SLP services are clinically indicated at this time to target language.     SLP Assessment  SLP Recommendation/Assessment: Patient needs continued Speech Lanaguage Pathology Services SLP Visit Diagnosis: Aphasia (R47.01)    Follow Up Recommendations  Inpatient Rehab    Frequency and Duration min 2x/week  2 weeks      SLP Evaluation Cognition  Overall Cognitive Status: Difficult to assess(due to language deficits) Arousal/Alertness: Awake/alert Orientation Level: Oriented to person;Disoriented to time;Oriented to place;Oriented to situation(Oriented to date, day, year (with cue) not month ) Attention: Sustained;Focused Focused Attention: Appears intact Sustained Attention: Impaired Sustained Attention Impairment: Verbal complex Memory: Impaired Memory Impairment: Retrieval deficit;Decreased recall of new information(Immediate: 3/3; delayed: 0/3; with cues: 3/3) Awareness: Appears intact Problem Solving: Impaired Problem Solving Impairment: Verbal complex(3/4)       Comprehension  Auditory Comprehension Overall Auditory Comprehension: Impaired Yes/No Questions: Impaired Basic Biographical Questions: (5/5) Complex Questions: (3/5) Paragraph Comprehension (via yes/no questions): (1/4) Commands: Impaired Two Step Basic Commands: (4/4) Multistep Basic Commands: (1/4) Visual Recognition/Discrimination Discrimination: Within Function Limits    Expression Expression Primary Mode of Expression: Verbal Verbal Expression Overall Verbal Expression: Impaired Initiation: Impaired Automatic Speech: Counting;Day of week;Month of year(Counting: 10/10; Days: 6/7; Months: 12/12 with additional ti) Level of Generative/Spontaneous Verbalization: Sentence Repetition: No impairment(Sentence: 5/5) Naming: Impairment Responsive: (2/5) Confrontation: Within functional limits(10/10  within additional processing time ) Convergent: (Sentence completion: 3/5) Pragmatics: No impairment Written Expression Dominant Hand: Right   Oral / Motor  Oral Motor/Sensory Function Overall Oral Motor/Sensory Function: Moderate impairment Facial ROM: Reduced right;Suspected CN VII (facial) dysfunction Facial Symmetry: Suspected CN VII (facial) dysfunction;Abnormal symmetry right Facial Strength: Reduced right;Suspected CN VII (facial) dysfunction Facial Sensation: Within Functional Limits Lingual  ROM: Within Functional Limits Lingual Symmetry: Within Functional Limits Motor Speech Overall Motor Speech: Appears within functional limits for tasks assessed Respiration: Within functional limits Phonation: Normal Resonance: Within functional limits Articulation: Within functional limitis Intelligibility: Intelligible Motor Planning: Witnin functional limits Motor Speech Errors: Not applicable   Emerson Schreifels I. Hardin Negus, [place], [place] Office number [phone] Pager [place] 05/22/2019, 2:50 PM

## 2019-05-22 NOTE — TOC Initial Note (Signed)
Transition of Care [facility]) - Initial/Assessment Note    Patient Details  Name: Brandon Summers MRN: [id number] Date of Birth: [birth year]  [facility] CM/SW Contact:    Pollie Friar, RN Phone Number: 05/22/2019, 1:08 PM  Clinical Narrative:                 Pt without insurance listed. CM met with the patient and he was able to provide insurance card. Copy sent to financial counseling.  Pt is without a PCP. TOC will follow to see about arranging hospital f/u prior to d/c.  Recommendations are for CIR. Awaiting consult.  Expected Discharge Plan: IP Rehab Facility Barriers to Discharge: Continued Medical Work up   Patient Goals and CMS Choice        Expected Discharge Plan and Services Expected Discharge Plan: [place]   Discharge Planning Services: CM Consult   Living arrangements for the past 2 months: Apartment(3rd floor)                                      Prior Living Arrangements/Services Living arrangements for the past 2 months: Apartment(3rd floor) Lives with:: Adult Children(daughter who is 31) Patient language and need for interpreter reviewed:: Yes(pt does pretty well understanding English) Do you feel safe going back to the place where you live?: Yes      Need for Family Participation in Patient Care: Yes (Comment) Care giver support system in place?: Yes (comment)(pt states his daughter lives with him and is able to provide 24 hour supervision)   Criminal Activity/Legal Involvement Pertinent to Current Situation/Hospitalization: No - Comment as needed  Activities of Daily Living      Permission Sought/Granted                  Emotional Assessment Appearance:: Appears stated age Attitude/Demeanor/Rapport: Engaged Affect (typically observed): Accepting, Pleasant Orientation: : Oriented to Self, Oriented to Place, Oriented to  Time, Oriented to Situation   Psych Involvement: No (comment)  Admission diagnosis:  TIA  (transient ischemic attack) [G45.9] Cerebrovascular accident (CVA), unspecified mechanism ([place]) [I63.9] Patient Active Problem List   Diagnosis Date Noted  . CVA (cerebral vascular accident) ([place]) 05/21/2019   PCP:  Patient, No Pcp Per Pharmacy:   [facility] [address] [place] [address] Phone: [phone] Fax: [phone]     Social Determinants of Health (SDOH) Interventions    Readmission Risk Interventions No flowsheet data found.

## 2019-05-22 NOTE — Evaluation (Signed)
Clinical/Bedside Swallow Evaluation Patient Details  Name: Brandon Summers MRN: [id number] Date of Birth: 01-27-1971  Today's Date: 05/22/2019 Time: SLP Start Time (ACUTE ONLY): 0857 SLP Stop Time (ACUTE ONLY): 0906 SLP Time Calculation (min) (ACUTE ONLY): 9 min  Past Medical History: History reviewed. No pertinent past medical history. Past Surgical History: History reviewed. No pertinent surgical history. HPI:  Brandon Summers is a 48 y.o. male with no known medical history presents to the emergency department for evaluation of blurry vision and difficulty holding objects, slurred speech and general weakness.  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   Assessment / Plan / Recommendation Clinical Impression  Brandon Summers completed 3 oz water consumption fluidly and no appreciation of oral or pharyngeal impairments. Cranial nerve VII involvement noted with right facial limited ROM, Brandon Summers stated normal sensation (thalamic stroke) and min delays initiating lingual movements. Potential for pocketing on right side and encourage him to self monitor. Brandon Summers has right inattention however suspect will be able to visualize all po's. Intervention will continue to reiterate strategies for efficient intake and manipulation of textures.    SLP Visit Diagnosis: Dysphagia, unspecified (R13.10)    Aspiration Risk  Mild aspiration risk    Diet Recommendation Regular;Thin liquid   Liquid Administration via: Cup;Straw Medication Administration: Whole meds with liquid Supervision: Patient able to self feed Compensations: Slow rate;Small sips/bites;Lingual sweep for clearance of pocketing Postural Changes: Seated upright at 90 degrees    Other  Recommendations Oral Care Recommendations: Oral care BID   Follow up Recommendations Inpatient Rehab      Frequency and Duration min 2x/week  2 weeks       Prognosis Prognosis for Safe Diet Advancement: Good       Swallow Study   General HPI: Brandon Summers is a 48 y.o. male with no known medical history presents to the emergency department for evaluation of blurry vision and difficulty holding objects, slurred speech and general weakness.  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 Type of Study: Bedside Swallow Evaluation Previous Swallow Assessment: (none) Diet Prior to this Study: NPO Temperature Spikes Noted: No Respiratory Status: Room air History of Recent Intubation: No Behavior/Cognition: Alert;Cooperative;Pleasant mood;Requires cueing Oral Cavity Assessment: Within Functional Limits Oral Care Completed by SLP: No Oral Cavity - Dentition: Adequate natural dentition Vision: (may have difficulty- right inattention) Self-Feeding Abilities: Able to feed self Patient Positioning: Upright in bed Baseline Vocal Quality: Low vocal intensity Volitional Cough: Strong Volitional Swallow: Able to elicit    Oral/Motor/Sensory Function Overall Oral Motor/Sensory Function: Moderate impairment Facial ROM: Reduced right;Suspected CN VII (facial) dysfunction Facial Symmetry: Suspected CN VII (facial) dysfunction;Abnormal symmetry right Facial Strength: Reduced right;Suspected CN VII (facial) dysfunction Facial Sensation: Within Functional Limits Lingual ROM: Within Functional Limits(difficulty initiating) Lingual Symmetry: Within Functional Limits Mandible: Within Functional Limits   Ice Chips Ice chips: Not tested   Thin Liquid Thin Liquid: Within functional limits Presentation: Cup;Straw    Nectar Thick Nectar Thick Liquid: Not tested   Honey Thick Honey Thick Liquid: Not tested   Puree Puree: Within functional limits   Solid     Solid: Within functional limits      Houston Siren 05/22/2019,9:25 AM   Orbie Pyo Colvin Caroli.Ed Risk analyst [phone] Office [phone]

## 2019-05-23 ENCOUNTER — Inpatient Hospital Stay (HOSPITAL_COMMUNITY): Payer: PRIVATE HEALTH INSURANCE

## 2019-05-23 ENCOUNTER — Encounter (HOSPITAL_COMMUNITY)
Admission: EM | Disposition: A | Discharge status: Home Health | Payer: Self-pay | Source: Home / Self Care | Attending: Internal Medicine

## 2019-05-23 ENCOUNTER — Encounter (HOSPITAL_COMMUNITY): Payer: Self-pay | Admitting: Nurse Practitioner

## 2019-05-23 ENCOUNTER — Other Ambulatory Visit: Payer: Self-pay | Admitting: Nurse Practitioner

## 2019-05-23 DIAGNOSIS — I6389 Other cerebral infarction: Secondary | ICD-10-CM | POA: Diagnosis not present

## 2019-05-23 DIAGNOSIS — Z720 Tobacco use: Secondary | ICD-10-CM | POA: Diagnosis not present

## 2019-05-23 DIAGNOSIS — E785 Hyperlipidemia, unspecified: Secondary | ICD-10-CM | POA: Diagnosis not present

## 2019-05-23 DIAGNOSIS — I639 Cerebral infarction, unspecified: Secondary | ICD-10-CM | POA: Diagnosis not present

## 2019-05-23 HISTORY — PX: TEE WITHOUT CARDIOVERSION: SHX5443

## 2019-05-23 HISTORY — PX: BUBBLE STUDY: SHX6837

## 2019-05-23 HISTORY — DX: Hyperlipidemia, unspecified: E78.5

## 2019-05-23 LAB — IRON AND TIBC
Iron: 113 ug/dL (ref 45–182)
Saturation Ratios: 31 % (ref 17.9–39.5)
TIBC: 360 ug/dL (ref 250–450)
UIBC: 247 ug/dL

## 2019-05-23 LAB — FOLATE: Folate: 10 ng/mL (ref >5.9)

## 2019-05-23 LAB — CBC
HCT: 52.8 % — ABNORMAL HIGH (ref 39.0–52.0)
Hemoglobin: 17.3 g/dL — ABNORMAL HIGH (ref 13.0–17.0)
MCH: 24 pg — ABNORMAL LOW (ref 26.0–34.0)
MCHC: 32.8 g/dL (ref 30.0–36.0)
MCV: 73.1 fL — ABNORMAL LOW (ref 80.0–100.0)
Platelets: 256 10*3/uL (ref 150–400)
RBC: 7.22 MIL/uL — ABNORMAL HIGH (ref 4.22–5.81)
RDW: 18.2 % — ABNORMAL HIGH (ref 11.5–15.5)
WBC: 5.7 10*3/uL (ref 4.0–10.5)
nRBC: 0 % (ref 0.0–0.2)

## 2019-05-23 LAB — RETICULOCYTES
Immature Retic Fract: 13.3 % (ref 2.3–15.9)
RBC.: 7.29 MIL/uL — ABNORMAL HIGH (ref 4.22–5.81)
Retic Count, Absolute: 87.5 10*3/uL (ref 19.0–186.0)
Retic Ct Pct: 1.2 % (ref 0.4–3.1)

## 2019-05-23 LAB — SEDIMENTATION RATE: Sed Rate: 1 mm/hr (ref 0–16)

## 2019-05-23 LAB — VITAMIN B12: Vitamin B-12: 898 pg/mL (ref 180–914)

## 2019-05-23 LAB — FERRITIN: Ferritin: 84 ng/mL (ref 24–336)

## 2019-05-23 SURGERY — ECHOCARDIOGRAM, TRANSESOPHAGEAL
Anesthesia: Moderate Sedation

## 2019-05-23 MED ORDER — DIPHENHYDRAMINE HCL 50 MG/ML IJ SOLN
INTRAMUSCULAR | Status: AC
  Filled 2019-05-23: qty 1

## 2019-05-23 MED ORDER — MIDAZOLAM HCL (PF) 5 MG/ML IJ SOLN
INTRAMUSCULAR | Status: AC
  Filled 2019-05-23: qty 2

## 2019-05-23 MED ORDER — FENTANYL CITRATE (PF) 100 MCG/2ML IJ SOLN
INTRAMUSCULAR | Status: DC | PRN
  Administered 2019-05-23 (×4): 25 ug via INTRAVENOUS

## 2019-05-23 MED ORDER — CLOPIDOGREL BISULFATE 75 MG PO TABS: 75.0000 mg | ORAL_TABLET | Freq: Every day | ORAL | 0 refills | Status: DC

## 2019-05-23 MED ORDER — SODIUM CHLORIDE 0.9 % IV SOLN: INTRAVENOUS | Status: DC

## 2019-05-23 MED ORDER — BUTAMBEN-TETRACAINE-BENZOCAINE 2-2-14 % EX AERO
INHALATION_SPRAY | CUTANEOUS | Status: DC | PRN
  Administered 2019-05-23: 12:00:00 2 via TOPICAL

## 2019-05-23 MED ORDER — ASPIRIN EC 81 MG PO TBEC: 81.0000 mg | DELAYED_RELEASE_TABLET | Freq: Every day | ORAL | 2 refills | Status: DC

## 2019-05-23 MED ORDER — ATORVASTATIN CALCIUM 40 MG PO TABS: 40.0000 mg | ORAL_TABLET | Freq: Every day | ORAL | 0 refills | Status: DC

## 2019-05-23 MED ORDER — MIDAZOLAM HCL (PF) 10 MG/2ML IJ SOLN
INTRAMUSCULAR | Status: DC | PRN
  Administered 2019-05-23 (×2): 2 mg via INTRAVENOUS
  Administered 2019-05-23: 1 mg via INTRAVENOUS
  Administered 2019-05-23: 2 mg via INTRAVENOUS
  Administered 2019-05-23: 1 mg via INTRAVENOUS

## 2019-05-23 MED ORDER — FENTANYL CITRATE (PF) 100 MCG/2ML IJ SOLN
INTRAMUSCULAR | Status: AC
  Filled 2019-05-23: qty 2

## 2019-05-23 MED FILL — CLOPIDOGREL 75 MG TABLET: 75 | 21 days supply | Qty: 21 | Fill #0

## 2019-05-23 NOTE — Discharge Summary (Signed)
Physician Discharge Summary  Brandon Summers WUJ:[id number] DOB: 05/05/1971 DOA: 05/21/2019  PCP: Patient, No Pcp Per  Admit date: 05/21/2019 Discharge date: 05/23/2019  Admitted From: home Disposition:  home   Recommendations for Outpatient Follow-up:  1. Cont to encourage smoking cessation- needs outpt PFTs 2. outpt neuro follow up  Home Health:  ordered Equipment/Devices:  none    Discharge Condition:  stable   CODE STATUS:  Full code   Diet recommendation:  Heart healthy Consultations:  Neurology    Discharge Diagnoses:  Principal Problem:   CVA (cerebral vascular accident) (HCC) Active Problems:   HLD (hyperlipidemia)   Nicotine abuse  Brief Summary: Brandon Summers is a 48 y/o with no known medical history who comes from home with complaints of blurry vision when he has both eyes open which resolved when he closes one eye. According to his wife, he had trouble holding objects, slurred speech and generalized weakness. He also reported chronic headaches.   CT head> unremarkable except for mucosal thickening in ethemoid sinuses MRI> 1. 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   Hospital Course:  Active Problems:   CVA (cerebral vascular accident)- ongoing double vision - loaded with Plavix - neuro recommends Aspirin and Plavix x 3 wks and then Aspirin alone - CTA head and neck is negative - 2 D ECHO ordered- see full report below- no thrombus seen -  TEE completed today- no thrombus noted, no PFO, EF 55-60% and no valvular disease - due to lack of insurance, he will not be getting a loop recorder today-  cardiology plans for an event recorder first as outpt - LDL 114- started Statin - NFA2ZHba1c is 5.7 - PT and OT evals completed and CIR recommended- I have consulted CIR but due to lack of insurance, he is unable to afford CIR - he states today that his double  vision has resolved- on exam, his speech has improved significantly and memory appears to be improving as well - as his function has improved today and will go home with home health   Mild Hyperlipidemia - statin started- need for dietary changes discussed- will need to follow  Nicotine abuse - has been counseled - mild centrilobular emphysema noted in lungs on CTA- needs outpt PFTs  Elevated Hb- mild Microcytosis- MCV 73.1 - Hb 18.4 yesterday and 17.3 today -   anemia panel is unrevealing   - ? Polycythemia-  he is a smoker but has no hypoxia on exam - needs to be followed up as outpt    Discharge Exam: Vitals:   05/23/19 1314 05/23/19 1324  BP: 114/83 102/76  Pulse: 63   Resp: 12 12  Temp:    SpO2: 95% 92%   Vitals:   05/23/19 1256 05/23/19 1304 05/23/19 1314 05/23/19 1324  BP: 98/69 106/83 114/83 102/76  Pulse: 64  63   Resp: 18 16 12 12   Temp: 98.4 F (36.9 C)     TempSrc: Oral     SpO2: 94% 93% 95% 92%  Weight:      Height:        General: Pt is alert, awake, not in acute distress Cardiovascular: RRR, S1/S2 +, no rubs, no gallops Respiratory: CTA bilaterally, no wheezing, no rhonchi Abdominal: Soft, NT, ND, bowel sounds + Extremities: no edema, no cyanosis   Discharge Instructions  Discharge Instructions    Diet - low sodium heart healthy  Complete by: As directed    Increase activity slowly   Complete by: As directed      Allergies as of 05/23/2019      Reactions   Asa [aspirin]       Medication List    TAKE these medications   aspirin EC 81 MG tablet Take 1 tablet (81 mg total) by mouth daily.   atorvastatin 40 MG tablet Commonly known as: LIPITOR Take 1 tablet (40 mg total) by mouth daily at 6 PM.   clopidogrel 75 MG tablet Commonly known as: PLAVIX Take 1 tablet (75 mg total) by mouth daily. Start taking on: May 24, 2019       Allergies  Allergen Reactions  . Asa [Aspirin]      Procedures/Studies: 2 d ECHO  1. The  left ventricle has normal systolic function, with an ejection fraction of 55-60%. The cavity size was normal. Left ventricular diastolic Doppler parameters are consistent with impaired relaxation. No evidence of left ventricular regional wall  motion abnormalities. 2. The right ventricle has normal systolic function. The cavity was normal. There is no increase in right ventricular wall thickness. 3. No evidence of mitral valve stenosis. No mitral regurgitation. 4. The aortic valve is tricuspid. No stenosis of the aortic valve. 5. The aortic root is normal in size and structure.  6. The IVC was normal in size. No complete TR doppler jet so unable to estimate PA systolic pressure.  Ct Angio Head W Or Wo Contrast  Result Date: 05/22/2019 CLINICAL DATA:  Stroke follow-up.  Headache today EXAM: CT ANGIOGRAPHY HEAD AND NECK TECHNIQUE: Multidetector CT imaging of the head and neck was performed using the standard protocol during bolus administration of intravenous contrast. Multiplanar CT image reconstructions and MIPs were obtained to evaluate the vascular anatomy. Carotid stenosis measurements (when applicable) are obtained utilizing NASCET criteria, using the distal internal carotid diameter as the denominator. CONTRAST:  75mL OMNIPAQUE IOHEXOL 350 MG/ML SOLN COMPARISON:  Brain MRI from yesterday FINDINGS: CTA NECK FINDINGS Aortic arch: Normal. Right carotid system: Vessels are smooth and widely patent. No atheromatous changes. Left carotid system: Vessels are smooth and widely patent. No atheromatous changes. Vertebral arteries: No proximal subclavian or vertebral stenosis or beading. Skeleton: No acute or aggressive finding Other neck: Negative Upper chest: Mild centrilobular emphysema. Presumed mucus in the trachea. Review of the MIP images confirms the above findings CTA HEAD FINDINGS Anterior circulation: Negative for branch occlusion, beading, stenosis, or calcified plaque. Negative for aneurysm  Posterior circulation: Vertebral and basilar arteries are smooth and widely patent. 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 Negative for branch occlusion or flow limiting stenosis. Negative for vessel beading. Negative for aneurysm (when accounting for tortuosity of the proximal left PICA as seen 3D renderings). Venous sinuses: Patent Anatomic variants: As above Delayed phase: At obtained Review of the MIP images confirms the above findings IMPRESSION: 1. Negative CTA of the head and neck. 2. Mild centrilobular emphysema. Electronically Signed   By: Marnee Spring M.D.   On: 05/22/2019 11:34   Dg Chest 2 View  Result Date: 05/22/2019 CLINICAL DATA:  48 year old male with altered mental status. EXAM: CHEST - 2 VIEW COMPARISON:  Chest radiograph dated 02/13/2010 FINDINGS: The heart size and mediastinal contours are within normal limits. Both lungs are clear. The visualized skeletal structures are unremarkable. IMPRESSION: No active cardiopulmonary disease. Electronically Signed   By: Elgie Collard M.D.   On: 05/22/2019 00:45   Ct  Head Wo Contrast  Result Date: 05/21/2019 CLINICAL DATA:  Headache and confusion with blurred vision EXAM: CT HEAD WITHOUT CONTRAST TECHNIQUE: Contiguous axial images were obtained from the base of the skull through the vertex without intravenous contrast. COMPARISON:  None. FINDINGS: Brain: The ventricles are normal in size and configuration. There is no intracranial mass, hemorrhage, extra-axial fluid collection, or midline shift. The brain parenchyma appears unremarkable. No acute infarct evident. Vascular: No hyperdense vessel. No vascular calcifications are appreciable. Skull: The bony calvarium appears intact. Sinuses/Orbits: There is mild mucosal thickening in several ethmoid air cells. Other visualized paranasal sinuses are clear. Orbits appear symmetric bilaterally. Other: Mastoid air cells are clear. IMPRESSION: Mild mucosal  thickening in several ethmoid air cells. Study otherwise unremarkable. Electronically Signed   By: Lowella Grip III M.D.   On: 05/21/2019 15:34   Ct Angio Neck W Or Wo Contrast  Result Date: 05/22/2019 CLINICAL DATA:  Stroke follow-up.  Headache today EXAM: CT ANGIOGRAPHY HEAD AND NECK TECHNIQUE: Multidetector CT imaging of the head and neck was performed using the standard protocol during bolus administration of intravenous contrast. Multiplanar CT image reconstructions and MIPs were obtained to evaluate the vascular anatomy. Carotid stenosis measurements (when applicable) are obtained utilizing NASCET criteria, using the distal internal carotid diameter as the denominator. CONTRAST:  33mL OMNIPAQUE IOHEXOL 350 MG/ML SOLN COMPARISON:  Brain MRI from yesterday FINDINGS: CTA NECK FINDINGS Aortic arch: Normal. Right carotid system: Vessels are smooth and widely patent. No atheromatous changes. Left carotid system: Vessels are smooth and widely patent. No atheromatous changes. Vertebral arteries: No proximal subclavian or vertebral stenosis or beading. Skeleton: No acute or aggressive finding Other neck: Negative Upper chest: Mild centrilobular emphysema. Presumed mucus in the trachea. Review of the MIP images confirms the above findings CTA HEAD FINDINGS Anterior circulation: Negative for branch occlusion, beading, stenosis, or calcified plaque. Negative for aneurysm Posterior circulation: Vertebral and basilar arteries are smooth and widely patent. 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 Negative for branch occlusion or flow limiting stenosis. Negative for vessel beading. Negative for aneurysm (when accounting for tortuosity of the proximal left PICA as seen 3D renderings). Venous sinuses: Patent Anatomic variants: As above Delayed phase: At obtained Review of the MIP images confirms the above findings IMPRESSION: 1. Negative CTA of the head and neck. 2. Mild  centrilobular emphysema. Electronically Signed   By: Monte Fantasia M.D.   On: 05/22/2019 11:34   Mr Brain Wo Contrast  Result Date: 05/21/2019 CLINICAL DATA:  Initial evaluation for acute blurry vision, headache. EXAM: MRI HEAD WITHOUT CONTRAST TECHNIQUE: Multiplanar, multiecho pulse sequences of the brain and surrounding structures were obtained without intravenous contrast. COMPARISON:  Prior CT from earlier the same day. FINDINGS: Brain: Examination moderately degraded by motion artifact. Cerebral volume within normal limits for age. Minimal chronic microvascular ischemic changes noted within the periventricular white matter. 1.3 x 1.5 x 2.2 cm acute ischemic nonhemorrhagic infarcts seen involving the ventral medial left thalamus (series 3, image 28). No associated mass effect. No other evidence for acute or subacute ischemia. Gray-white matter differentiation otherwise maintained. No other areas of chronic infarction. No foci of susceptibility artifact to suggest acute or chronic intracranial hemorrhage. No mass lesion, midline shift or mass effect. No hydrocephalus. No extra-axial fluid collection. Pituitary gland within normal limits. Vascular: Major intracranial vascular flow voids are maintained. Skull and upper cervical spine: Craniocervical junction within normal limits. Upper cervical spine normal. Bone marrow signal intensity within  normal limits. No scalp soft tissue abnormality. Sinuses/Orbits: Globes and orbital soft tissues within normal limits. Paranasal sinuses are clear. No mastoid effusion. Other: None. IMPRESSION: 1. 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 2. Underlying mild chronic microvascular ischemic disease for age. Electronically Signed   By: Rise MuBenjamin  McClintock M.D.   On: 05/21/2019 18:31     The results of significant diagnostics from this hospitalization (including imaging, microbiology, ancillary and laboratory) are  listed below for reference.     Microbiology: Recent Results (from the past 240 hour(s))  SARS Coronavirus 2 (CEPHEID - Performed in [facility] lab), Hosp Order     Status: None   Collection Time: 05/21/19  7:13 PM   Specimen: Nasopharyngeal Swab  Result Value Ref Range Status   SARS Coronavirus 2 NEGATIVE NEGATIVE Final    Comment: (NOTE) If result is NEGATIVE SARS-CoV-2 target nucleic acids are NOT DETECTED. The SARS-CoV-2 RNA is generally detectable in upper and lower  respiratory specimens during the acute phase of infection. The lowest  concentration of SARS-CoV-2 viral copies this assay can detect is 250  copies / mL. A negative result does not preclude SARS-CoV-2 infection  and should not be used as the sole basis for treatment or other  patient management decisions.  A negative result may occur with  improper specimen collection / handling, submission of specimen other  than nasopharyngeal swab, presence of viral mutation(s) within the  areas targeted by this assay, and inadequate number of viral copies  (<250 copies / mL). A negative result must be combined with clinical  observations, patient history, and epidemiological information. If result is POSITIVE SARS-CoV-2 target nucleic acids are DETECTED. The SARS-CoV-2 RNA is generally detectable in upper and lower  respiratory specimens dur ing the acute phase of infection.  Positive  results are indicative of active infection with SARS-CoV-2.  Clinical  correlation with patient history and other diagnostic information is  necessary to determine patient infection status.  Positive results do  not rule out bacterial infection or co-infection with other viruses. If result is PRESUMPTIVE POSTIVE SARS-CoV-2 nucleic acids MAY BE PRESENT.   A presumptive positive result was obtained on the submitted specimen  and confirmed on repeat testing.  While 2019 novel coronavirus  (SARS-CoV-2) nucleic acids may be present in  the submitted sample  additional confirmatory testing may be necessary for epidemiological  and / or clinical management purposes  to differentiate between  SARS-CoV-2 and other Sarbecovirus currently known to infect humans.  If clinically indicated additional testing with an alternate test  methodology [phone]([id number]) is advised. The SARS-CoV-2 RNA is generally  detectable in upper and lower respiratory sp ecimens during the acute  phase of infection. The expected result is Negative. Fact Sheet for Patients:  BoilerBrush.com.cyhttps://www.fda.gov/media/136312/download Fact Sheet for Healthcare Providers: https://pope.com/https://www.fda.gov/media/136313/download This test is not yet approved or cleared by the [place] FDA and has been authorized for detection and/or diagnosis of SARS-CoV-2 by FDA under an Emergency Use Authorization (EUA).  This EUA will remain in effect (meaning this test can be used) for the duration of the COVID-19 declaration under Section 564(b)(1) of the Act, 21 U.S.C. section 360bbb-3(b)(1), unless the authorization is terminated or revoked sooner. Performed at [facility], [address] [place], [place] [id number]      Labs: BNP (last 3 results) No results for input(s): BNP in the last 8760 hours. Basic Metabolic Panel: Recent Labs  Lab 05/21/19  1458 05/21/19 1507  NA 141 139  K 4.4 4.3  CL 104 104  CO2 30  --   GLUCOSE 95 89  BUN 12 11  CREATININE 1.12 1.10  CALCIUM 9.0  --    Liver Function Tests: Recent Labs  Lab 05/21/19 1458  AST 24  ALT 44  ALKPHOS 52  BILITOT 0.5  PROT 6.0*  ALBUMIN 3.3*   No results for input(s): LIPASE, AMYLASE in the last 168 hours. No results for input(s): AMMONIA in the last 168 hours. CBC: Recent Labs  Lab 05/21/19 1458 05/21/19 1507 05/23/19 0439  WBC 7.3  --  5.7  NEUTROABS 4.4  --   --   HGB 16.7 18.4* 17.3*  HCT 53.0* 54.0* 52.8*  MCV 76.0*  --  73.1*  PLT 226  --  256   Cardiac Enzymes: No results for  input(s): CKTOTAL, CKMB, CKMBINDEX, TROPONINI in the last 168 hours. BNP: Invalid input(s): POCBNP CBG: Recent Labs  Lab 05/21/19 1423  GLUCAP 136*   D-Dimer No results for input(s): DDIMER in the last 72 hours. Hgb A1c Recent Labs    05/22/19 0915  HGBA1C 5.7*   Lipid Profile Recent Labs    05/22/19 0915  CHOL 170  HDL 44  LDLCALC 114*  TRIG 61  CHOLHDL 3.9   Thyroid function studies No results for input(s): TSH, T4TOTAL, T3FREE, THYROIDAB in the last 72 hours.  Invalid input(s): FREET3 Anemia work up Recent Labs    05/23/19 0750  VITAMINB12 898  FOLATE 10.0  FERRITIN 84  TIBC 360  IRON 113  RETICCTPCT 1.2   Urinalysis No results found for: COLORURINE, APPEARANCEUR, LABSPEC, PHURINE, GLUCOSEU, HGBUR, BILIRUBINUR, KETONESUR, PROTEINUR, UROBILINOGEN, NITRITE, LEUKOCYTESUR Sepsis Labs Invalid input(s): PROCALCITONIN,  WBC,  LACTICIDVEN Microbiology Recent Results (from the past 240 hour(s))  SARS Coronavirus 2 (CEPHEID - Performed in [facility] lab), Hosp Order     Status: None   Collection Time: 05/21/19  7:13 PM   Specimen: Nasopharyngeal Swab  Result Value Ref Range Status   SARS Coronavirus 2 NEGATIVE NEGATIVE Final    Comment: (NOTE) If result is NEGATIVE SARS-CoV-2 target nucleic acids are NOT DETECTED. The SARS-CoV-2 RNA is generally detectable in upper and lower  respiratory specimens during the acute phase of infection. The lowest  concentration of SARS-CoV-2 viral copies this assay can detect is 250  copies / mL. A negative result does not preclude SARS-CoV-2 infection  and should not be used as the sole basis for treatment or other  patient management decisions.  A negative result may occur with  improper specimen collection / handling, submission of specimen other  than nasopharyngeal swab, presence of viral mutation(s) within the  areas targeted by this assay, and inadequate number of viral copies  (<250 copies / mL). A negative  result must be combined with clinical  observations, patient history, and epidemiological information. If result is POSITIVE SARS-CoV-2 target nucleic acids are DETECTED. The SARS-CoV-2 RNA is generally detectable in upper and lower  respiratory specimens dur ing the acute phase of infection.  Positive  results are indicative of active infection with SARS-CoV-2.  Clinical  correlation with patient history and other diagnostic information is  necessary to determine patient infection status.  Positive results do  not rule out bacterial infection or co-infection with other viruses. If result is PRESUMPTIVE POSTIVE SARS-CoV-2 nucleic acids MAY BE PRESENT.   A presumptive positive result was obtained on the submitted specimen  and confirmed on repeat  testing.  While 2019 novel coronavirus  (SARS-CoV-2) nucleic acids may be present in the submitted sample  additional confirmatory testing may be necessary for epidemiological  and / or clinical management purposes  to differentiate between  SARS-CoV-2 and other Sarbecovirus currently known to infect humans.  If clinically indicated additional testing with an alternate test  methodology [phone]) is advised. The SARS-CoV-2 RNA is generally  detectable in upper and lower respiratory sp ecimens during the acute  phase of infection. The expected result is Negative. Fact Sheet for Patients:  BoilerBrush.com.cy Fact Sheet for Healthcare Providers: https://pope.com/ This test is not yet approved or cleared by the [place] FDA and has been authorized for detection and/or diagnosis of SARS-CoV-2 by FDA under an Emergency Use Authorization (EUA).  This EUA will remain in effect (meaning this test can be used) for the duration of the COVID-19 declaration under Section 564(b)(1) of the Act, 21 U.S.C. section 360bbb-3(b)(1), unless the authorization is terminated or revoked sooner. Performed at [facility], [address] [place], [place] [address]      Time coordinating discharge in minutes: 65  SIGNED:   Calvert Cantor, MD  [facility] 05/23/2019, 3:09 PM Pager   If 7PM-7AM, please contact night-coverage [email or web address] Password [email or web address]

## 2019-05-23 NOTE — Progress Notes (Signed)
Echocardiogram Echocardiogram Transesophageal has been performed.  Brandon Summers 05/23/2019, 12:40 PM

## 2019-05-23 NOTE — CV Procedure (Signed)
TRANSESOPHAGEAL ECHOCARDIOGRAM (TEE) NOTE  INDICATIONS: cryptogenic stroke  PROCEDURE:   Informed consent was obtained prior to the procedure. The risks, benefits and alternatives for the procedure were discussed and the patient comprehended these risks.  Risks include, but are not limited to, cough, sore throat, vomiting, nausea, somnolence, esophageal and stomach trauma or perforation, bleeding, low blood pressure, aspiration, pneumonia, infection, trauma to the teeth and death.    After a procedural time-out, the patient was given 8 mg versed and 100 mcg fentanyl for moderate sedation.  The patient's heart rate, blood pressure, and oxygen saturation are monitored continuously during the procedure.The oropharynx was anesthetized 10 cc of topical 1% viscous lidocaine.  The transesophageal probe was inserted in the esophagus and stomach without difficulty and multiple views were obtained.  The patient was kept under observation until the patient left the procedure room.  The period of conscious sedation is 20 minutes, of which I was present face-to-face 100% of this time. The patient left the procedure room in stable condition.   Agitated microbubble saline contrast was administered.  COMPLICATIONS:    There were no immediate complications.  Findings:  1. LEFT VENTRICLE: The left ventricular wall thickness is normal.  The left ventricular cavity is normal in size. Wall motion is normal.  LVEF is 55-60%.  2. RIGHT VENTRICLE:  The right ventricle is normal in structure and function without any thrombus or masses.    3. LEFT ATRIUM:  The left atrium is normal in size without any thrombus or masses.  There is not spontaneous echo contrast ("smoke") in the left atrium consistent with a low flow state.  4. LEFT ATRIAL APPENDAGE:  The left atrial appendage is free of any thrombus or masses. The appendage has single lobes. Pulse doppler indicates high flow in the appendage.  5. ATRIAL SEPTUM:   The atrial septum appears intact and is free of thrombus and/or masses.  There is no evidence for interatrial shunting by color doppler and saline microbubble.  6. RIGHT ATRIUM:  The right atrium is normal in size and function without any thrombus or masses.  7. MITRAL VALVE:  The mitral valve is normal in structure and function with no regurgitation.  There were no vegetations or stenosis.  8. AORTIC VALVE:  The aortic valve is trileaflet, normal in structure and function with no regurgitation.  There were no vegetations or stenosis  9. TRICUSPID VALVE:  The tricuspid valve is normal in structure and function with no regurgitation.  There were no vegetations or stenosis  10.  PULMONIC VALVE:  The pulmonic valve is normal in structure and function with nono regurgitation.  There were no vegetations or stenosis.   11. AORTIC ARCH, ASCENDING AND DESCENDING AORTA:  There was no Ron Parker et. Al, 1992) atherosclerosis of the ascending aorta, aortic arch, or proximal descending aorta.  12. PULMONARY VEINS: Anomalous pulmonary venous return was not noted.  13. PERICARDIUM: The pericardium appeared normal and non-thickened.  There is no pericardial effusion.  IMPRESSION:   1. No LAA thrombus 2. Negative for PFO 3. No significant valvular disease 4. LVEF 55-60% with normal wall motion  RECOMMENDATIONS:    1.  No cardiac source of emboli was noted.  Time Spent Directly with the Patient:  45 minutes   Pixie Casino, MD, [facility], [place] Director of the Advanced Lipid Disorders &  Cardiovascular Risk Reduction Clinic Diplomate of the American Board of Clinical Lipidology Attending Cardiologist  Direct Dial: [phone]  Fax: [phone]  Website:  [email or web address]  Basia Mcginty C Dajai Wahlert 05/23/2019, 12:36 PM

## 2019-05-23 NOTE — Progress Notes (Signed)
Inpatient Rehab Admissions:  Inpatient Rehab Consult received.  I met with patient at the bedside for rehabilitation assessment and to discuss goals and expectations of an inpatient rehab admission.  I explained to him that, per the representative at his insurance company, inpatient care/rehab is not covered.  This would not preclude him from a CIR, necessarily, but he would need to agree to come as self pay status. He seemed confused by this, and began repeatedly trying to call his boss at [employer].  I tried to explain that I had already spoken to [employer], but he still seemed to not understand.  F/U with Bronson Curb, CM, and will attempt to meet with patient again later this afternoon to determine whether he would be interested in CIR as self pay.   Signed: Shann Medal, PT, DPT Admissions Coordinator [phone] 05/23/19  11:20 AM

## 2019-05-23 NOTE — Progress Notes (Signed)
Physical Therapy Treatment Note  Patient seen for mobility progression. Pt is making progress toward PT goals and tolerated mobility well. Pt reports that his vision is close to baseline and denies diplopia. Continue to progress as tolerated.    05/23/19 1033  PT Visit Information  Last PT Received On 05/23/19  Assistance Needed +1  History of Present Illness Patient is a 48 y/o male presenting to the ED on 05/21/2019 with primary complaints of AMS and blurred vision. CT head unremarkable except for mucosal thickening in ethemoid sinuses. MRI revealing 1. 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 No significant PMH.   Subjective Data  Patient Stated Goal none stated  Precautions  Precautions Fall  Restrictions  Weight Bearing Restrictions No  Pain Assessment  Pain Assessment No/denies pain  Cognition  Arousal/Alertness Awake/alert  Behavior During Therapy WFL for tasks assessed/performed  Overall Cognitive Status Difficult to assess (due to language deficits)  Area of Impairment Following commands  Following Commands Follows one step commands with increased time;Follows multi-step commands inconsistently  Problem Solving Slow processing;Requires verbal cues  Bed Mobility  Overal bed mobility Modified Independent  Bed Mobility Supine to Sit  General bed mobility comments HOB elevated slightly; increased time  Transfers  Overall transfer level Needs assistance  Equipment used None  Transfers Sit to/from Stand;Stand Pivot Transfers  Sit to Stand Supervision  General transfer comment supervision for safety  Ambulation/Gait  Ambulation/Gait assistance Supervision;Min guard  Gait Distance (Feet) 300 Feet  Assistive device None  Gait Pattern/deviations Step-through pattern  General Gait Details grossly supervision for safety and min guard for some tasks during DGI; no LOB or significant  gait deviations noted with head turns or turning   Gait velocity decreased  Modified Rankin (Stroke Patients Only)  Pre-Morbid Rankin Score 0  Modified Rankin 4  Balance  Overall balance assessment Needs assistance  Sitting-balance support No upper extremity supported;Feet supported  Sitting balance-Leahy Scale Good  Standing balance support No upper extremity supported;During functional activity  Standing balance-Leahy Scale Fair  Standardized Balance Assessment  Standardized Balance Assessment  Dynamic Gait Index  Dynamic Gait Index  Level Surface 3  Change in Gait Speed 2  Gait with Horizontal Head Turns 2  Gait with Vertical Head Turns 2  Gait and Pivot Turn 2  Step Over Obstacle 3  Step Around Obstacles 2  Steps 3  Total Score 19  PT - End of Session  Equipment Utilized During Treatment Gait belt  Activity Tolerance Patient tolerated treatment well  Patient left with call bell/phone within reach;in chair;with chair alarm set  Nurse Communication Mobility status   PT - Assessment/Plan  PT Plan Current plan remains appropriate  PT Visit Diagnosis Unsteadiness on feet (R26.81);Other abnormalities of gait and mobility (R26.89);Muscle weakness (generalized) (M62.81)  PT Frequency (ACUTE ONLY) Min 4X/week  Follow Up Recommendations CIR  PT equipment None recommended by PT  AM-PAC PT "6 Clicks" Mobility Outcome Measure (Version 2)  Help needed turning from your back to your side while in a flat bed without using bedrails? 4  Help needed moving from lying on your back to sitting on the side of a flat bed without using bedrails? 4  Help needed moving to and from a bed to a chair (including a wheelchair)? 4  Help needed standing up from a chair using your arms (e.g., wheelchair or bedside chair)? 4  Help needed to walk in hospital  room? 3  Help needed climbing 3-5 steps with a railing?  3  6 Click Score 22  Consider Recommendation of Discharge To: Home with no services  PT Goal  Progression  Progress towards PT goals Progressing toward goals  PT Time Calculation  PT Start Time (ACUTE ONLY) 0930  PT Stop Time (ACUTE ONLY) 0951  PT Time Calculation (min) (ACUTE ONLY) 21 min  PT General Charges  $$ ACUTE PT VISIT 1 Visit  PT Treatments  $Gait Training 8-22 mins   Earney Navy, PTA Acute Rehabilitation Services Pager: [phone] Office: [phone]

## 2019-05-23 NOTE — Progress Notes (Signed)
TCD bubble study completed with Dr. Leonie Man. Refer to "CV Proc" under chart review to view preliminary results.  05/23/2019 3:32 PM Maudry Mayhew, MHA, RVT, RDCS, RDMS

## 2019-05-23 NOTE — H&P (Signed)
   INTERVAL PROCEDURE H&P  History and Physical Interval Note:  05/23/2019 12:08 PM  Brandon Summers has presented today for their planned procedure. The various methods of treatment have been discussed with the patient and family. After consideration of risks, benefits and other options for treatment, the patient has consented to the procedure.  The patients' outpatient history has been reviewed, patient examined, and no change in status from most recent office note within the past 30 days. I have reviewed the patients' chart and labs and will proceed as planned. Questions were answered to the patient's satisfaction.   Pixie Casino, MD, [facility], [place] Director of the Advanced Lipid Disorders &  Cardiovascular Risk Reduction Clinic Diplomate of the American Board of Clinical Lipidology Attending Cardiologist  Direct Dial: [phone]  Fax: [phone]  Website:  [email or web address] 05/23/2019, 12:08 PM

## 2019-05-23 NOTE — Progress Notes (Signed)
Inpatient Rehab Admissions Coordinator:   Spoke to pt's wife about pt's current level of function (300' supervision/min guard, and 19/24 on DGI, where 19 is the cut off for falls).  Discussed that, in my opinion, he would likely do very well with home health, or outpatient follow up.  Would recommend a home exercise program. Otila Kluver agreed.  Will sign off at this time.   Shann Medal, PT, DPT Admissions Coordinator [phone] 05/23/19  2:44 PM

## 2019-05-23 NOTE — Consult Note (Addendum)
ELECTROPHYSIOLOGY CONSULT NOTE  Patient ID: Brandon Miumadou [place]nington MRN: [id number], DOB/AGE: 47/11/1970   Admit date: 05/21/2019 Date of Consult: 05/23/2019  Primary Physician: Patient, No Pcp Per Primary Cardiologist: new to [facility] Reason for Consultation: Cryptogenic stroke; recommendations regarding Implantable Loop Recorder  History of Present Illness EP has been asked to evaluate Brandon Summers Flud for placement of an implantable loop recorder to monitor for atrial fibrillation by Dr Pearlean BrownieSethi.  The patient was admitted on 05/21/2019 with blurred vision.  They first developed symptoms 05/20/19.  Imaging demonstrated large left thalamic stroke.  he has undergone workup for stroke including echocardiogram and carotid imaging.  The patient has been monitored on telemetry which has demonstrated sinus rhythm with no arrhythmias.  Inpatient stroke work-up is to be completed with a TEE.   Echocardiogram this admission demonstrated EF 55-60%, LA 30.  Lab work is reviewed.  Prior to admission, the patient denies chest pain, shortness of breath, dizziness, palpitations, or syncope.  They are recovering from their stroke with plans to return home at discharge.  Prior to this admission, he had never been to the doctor.    Past Medical History:  Diagnosis Date   Stroke (cerebrum) (HCC)    Tobacco abuse      Surgical History: History reviewed. No pertinent surgical history.   Medications Prior to Admission  Medication Sig Dispense Refill Last Dose   penicillin v potassium (VEETID) 500 MG tablet Take 2 tablets (1,000 mg total) by mouth 2 (two) times daily. X 7 days (Patient not taking: Reported on 05/21/2019) 28 tablet 0 Completed Course at Unknown time    Inpatient Medications:   atorvastatin  40 mg Oral q1800   clopidogrel  75 mg Oral Daily   enoxaparin (LOVENOX) injection  40 mg Subcutaneous Daily    Allergies:  Allergies  Allergen Reactions   Asa [Aspirin]     Social History    Socioeconomic History   Marital status: Married    Spouse name: Not on file   Number of children: Not on file   Years of education: Not on file   Highest education level: Not on file  Occupational History   Not on file  Social Needs   Financial resource strain: Not on file   Food insecurity    Worry: Not on file    Inability: Not on file   Transportation needs    Medical: Not on file    Non-medical: Not on file  Tobacco Use   Smoking status: Current Every Day Smoker    Packs/day: 0.50    Types: Cigarettes   Smokeless tobacco: Never Used  Substance and Sexual Activity   Alcohol use: Yes    Comment: occasional   Drug use: No   Sexual activity: Not on file  Lifestyle   Physical activity    Days per week: Not on file    Minutes per session: Not on file   Stress: Not on file  Relationships   Social connections    Talks on phone: Not on file    Gets together: Not on file    Attends religious service: Not on file    Active member of club or organization: Not on file    Attends meetings of clubs or organizations: Not on file    Relationship status: Not on file   Intimate partner violence    Fear of current or ex partner: Not on file    Emotionally abused: Not on file    Physically abused:  Not on file    Forced sexual activity: Not on file  Other Topics Concern   Not on file  Social History Narrative   Marital status: separated; [place]   Children: 2 children; no grandchildren.   Employment: chef   Tobacco: 1/2ppd   Alcohol: none   Drugs: none   Exercise: none.     Family History  Problem Relation Age of Onset   Hypertension Mother    Stroke Mother       Review of Systems: All other systems reviewed and are otherwise negative except as noted above.  Physical Exam: Vitals:   05/22/19 1603 05/22/19 1947 05/22/19 2317 05/23/19 0419  BP: 112/71 111/75 116/78 107/73  Pulse: 62 64 (!) 59 66  Resp: 13 13 13 19    Temp: 98.4 F (36.9 C) 98.3 F (36.8 C) 98.6 F (37 C) 97.9 F (36.6 C)  TempSrc: Oral Oral Oral   SpO2: 100% 100% 100% 100%  Weight:      Height:        GEN- The patient is well appearing, alert and oriented x 3 today.   Head- normocephalic, atraumatic Eyes-  Sclera clear, conjunctiva pink Ears- hearing intact Oropharynx- clear Neck- supple Lungs- Clear to ausculation bilaterally, normal work of breathing Heart- Regular rate and rhythm   GI- soft, NT, ND, + BS Extremities- no clubbing, cyanosis, or edema MS- no significant deformity or atrophy Skin- no rash or lesion Psych- euthymic mood, full affect   Labs:   Lab Results  Component Value Date   WBC 5.7 05/23/2019   HGB 17.3 (H) 05/23/2019   HCT 52.8 (H) 05/23/2019   MCV 73.1 (L) 05/23/2019   PLT 256 05/23/2019    Recent Labs  Lab 05/21/19 1458 05/21/19 1507  NA 141 139  K 4.4 4.3  CL 104 104  CO2 30  --   BUN 12 11  CREATININE 1.12 1.10  CALCIUM 9.0  --   PROT 6.0*  --   BILITOT 0.5  --   ALKPHOS 52  --   ALT 44  --   AST 24  --   GLUCOSE 95 89     Radiology/Studies: Ct Angio Head W Or Wo Contrast  Result Date: 05/22/2019 CLINICAL DATA:  Stroke follow-up.  Headache today EXAM: CT ANGIOGRAPHY HEAD AND NECK TECHNIQUE: Multidetector CT imaging of the head and neck was performed using the standard protocol during bolus administration of intravenous contrast. Multiplanar CT image reconstructions and MIPs were obtained to evaluate the vascular anatomy. Carotid stenosis measurements (when applicable) are obtained utilizing NASCET criteria, using the distal internal carotid diameter as the denominator. CONTRAST:  75mL OMNIPAQUE IOHEXOL 350 MG/ML SOLN COMPARISON:  Brain MRI from yesterday FINDINGS: CTA NECK FINDINGS Aortic arch: Normal. Right carotid system: Vessels are smooth and widely patent. No atheromatous changes. Left carotid system: Vessels are smooth and widely patent. No atheromatous changes. Vertebral  arteries: No proximal subclavian or vertebral stenosis or beading. Skeleton: No acute or aggressive finding Other neck: Negative Upper chest: Mild centrilobular emphysema. Presumed mucus in the trachea. Review of the MIP images confirms the above findings CTA HEAD FINDINGS Anterior circulation: Negative for branch occlusion, beading, stenosis, or calcified plaque. Negative for aneurysm Posterior circulation: Vertebral and basilar arteries are smooth and widely patent. 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 Negative for branch occlusion or flow limiting stenosis. Negative for vessel beading. Negative for aneurysm (when accounting for tortuosity  of the proximal left PICA as seen 3D renderings). Venous sinuses: Patent Anatomic variants: As above Delayed phase: At obtained Review of the MIP images confirms the above findings IMPRESSION: 1. Negative CTA of the head and neck. 2. Mild centrilobular emphysema. Electronically Signed   By: Monte Fantasia M.D.   On: 05/22/2019 11:34   Dg Chest 2 View  Result Date: 05/22/2019 CLINICAL DATA:  48 year old male with altered mental status. EXAM: CHEST - 2 VIEW COMPARISON:  Chest radiograph dated 02/13/2010 FINDINGS: The heart size and mediastinal contours are within normal limits. Both lungs are clear. The visualized skeletal structures are unremarkable. IMPRESSION: No active cardiopulmonary disease. Electronically Signed   By: Anner Crete M.D.   On: 05/22/2019 00:45   Ct Head Wo Contrast  Result Date: 05/21/2019 CLINICAL DATA:  Headache and confusion with blurred vision EXAM: CT HEAD WITHOUT CONTRAST TECHNIQUE: Contiguous axial images were obtained from the base of the skull through the vertex without intravenous contrast. COMPARISON:  None. FINDINGS: Brain: The ventricles are normal in size and configuration. There is no intracranial mass, hemorrhage, extra-axial fluid collection, or midline shift. The brain parenchyma appears  unremarkable. No acute infarct evident. Vascular: No hyperdense vessel. No vascular calcifications are appreciable. Skull: The bony calvarium appears intact. Sinuses/Orbits: There is mild mucosal thickening in several ethmoid air cells. Other visualized paranasal sinuses are clear. Orbits appear symmetric bilaterally. Other: Mastoid air cells are clear. IMPRESSION: Mild mucosal thickening in several ethmoid air cells. Study otherwise unremarkable. Electronically Signed   By: Lowella Grip III M.D.   On: 05/21/2019 15:34   Ct Angio Neck W Or Wo Contrast  Result Date: 05/22/2019 CLINICAL DATA:  Stroke follow-up.  Headache today EXAM: CT ANGIOGRAPHY HEAD AND NECK TECHNIQUE: Multidetector CT imaging of the head and neck was performed using the standard protocol during bolus administration of intravenous contrast. Multiplanar CT image reconstructions and MIPs were obtained to evaluate the vascular anatomy. Carotid stenosis measurements (when applicable) are obtained utilizing NASCET criteria, using the distal internal carotid diameter as the denominator. CONTRAST:  62mL OMNIPAQUE IOHEXOL 350 MG/ML SOLN COMPARISON:  Brain MRI from yesterday FINDINGS: CTA NECK FINDINGS Aortic arch: Normal. Right carotid system: Vessels are smooth and widely patent. No atheromatous changes. Left carotid system: Vessels are smooth and widely patent. No atheromatous changes. Vertebral arteries: No proximal subclavian or vertebral stenosis or beading. Skeleton: No acute or aggressive finding Other neck: Negative Upper chest: Mild centrilobular emphysema. Presumed mucus in the trachea. Review of the MIP images confirms the above findings CTA HEAD FINDINGS Anterior circulation: Negative for branch occlusion, beading, stenosis, or calcified plaque. Negative for aneurysm Posterior circulation: Vertebral and basilar arteries are smooth and widely patent. 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 Negative  for branch occlusion or flow limiting stenosis. Negative for vessel beading. Negative for aneurysm (when accounting for tortuosity of the proximal left PICA as seen 3D renderings). Venous sinuses: Patent Anatomic variants: As above Delayed phase: At obtained Review of the MIP images confirms the above findings IMPRESSION: 1. Negative CTA of the head and neck. 2. Mild centrilobular emphysema. Electronically Signed   By: Monte Fantasia M.D.   On: 05/22/2019 11:34   Mr Brain Wo Contrast  Result Date: 05/21/2019 CLINICAL DATA:  Initial evaluation for acute blurry vision, headache. EXAM: MRI HEAD WITHOUT CONTRAST TECHNIQUE: Multiplanar, multiecho pulse sequences of the brain and surrounding structures were obtained without intravenous contrast. COMPARISON:  Prior CT from earlier the same  day. FINDINGS: Brain: Examination moderately degraded by motion artifact. Cerebral volume within normal limits for age. Minimal chronic microvascular ischemic changes noted within the periventricular white matter. 1.3 x 1.5 x 2.2 cm acute ischemic nonhemorrhagic infarcts seen involving the ventral medial left thalamus (series 3, image 28). No associated mass effect. No other evidence for acute or subacute ischemia. Gray-white matter differentiation otherwise maintained. No other areas of chronic infarction. No foci of susceptibility artifact to suggest acute or chronic intracranial hemorrhage. No mass lesion, midline shift or mass effect. No hydrocephalus. No extra-axial fluid collection. Pituitary gland within normal limits. Vascular: Major intracranial vascular flow voids are maintained. Skull and upper cervical spine: Craniocervical junction within normal limits. Upper cervical spine normal. Bone marrow signal intensity within normal limits. No scalp soft tissue abnormality. Sinuses/Orbits: Globes and orbital soft tissues within normal limits. Paranasal sinuses are clear. No mastoid effusion. Other: None. IMPRESSION: 1. 1.3 x 1.5  x 2.2 cm acute ischemic nonhemorrhagic infarct involving the ventromedial left thalamus. 2. Underlying mild chronic microvascular ischemic disease for age. Electronically Signed   By: Rise MuBenjamin  McClintock M.D.   On: 05/21/2019 18:31    12-lead ECG sinus rhythm, rate 64, normal intervals (personally reviewed) All prior EKG's in EPIC reviewed with no documented atrial fibrillation  Telemetry SR (personally reviewed)  Assessment and Plan:  1. Cryptogenic stroke The patient presents with cryptogenic stroke.  The patient has a TEE planned for this AM.  Dr Johney FrameAllred spoke at length with the patient about monitoring for afib with an implantable loop recorder or 30 day event monitor.  He does not currently have insurance but is being evaluated for Medicaid.  Will plan 30 day event monitor with outpatient EP follow up. If no AF detected on 30 day monitor, will plan for ILR implant.    Event monitor ordered. Please call with questions.   Gypsy BalsamAmber Seiler, NP 05/23/2019 8:23 AM   I have seen, examined the patient, and reviewed the above assessment and plan.  Changes to above are made where necessary.  On exam, RRR.  I agree with 30 day monitor at discharge.  Follow-up with EP NP in the office in 6 weeks.  Co Sign: Hillis RangeJames Bryndan Bilyk, MD 05/23/2019 11:36 AM

## 2019-05-23 NOTE — Progress Notes (Addendum)
PROGRESS NOTE    Brandon Summers   NWG:[id number]  DOB: [birth year]  DOA: 05/21/2019 PCP: Patient, No Pcp Per   Brief Narrative:  Brandon Summers is a 48 y/o with no known medical history who comes from home with complaints of blurry vision when he has both eyes open which resolved when he closes one eye. According to his wife, he had trouble holding objects, slurred speech and generalized weakness. He also reported chronic headaches.   CT head> unremarkable except for mucosal thickening in ethemoid sinuses MRI> 1. 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    Subjective: He states he is no longer seeing double. He has no complaints today.      Assessment & Plan:   Active Problems:   CVA (cerebral vascular accident)- ongoing double vision - loaded with Plavix, now on 75 mg daily - CTA head and neck is negative - 2 D ECHO ordered- see full report below- no thrombus seen - he will under go a TEE today- due to lack of insurance, he will not be getting a loop recorder today-  cardiology plans for an event recorder first as outpt - LDL 114- started Statin - VHQ4O is 5.7 - PT and OT evals completed and CIR recommended- I have consulted CIR and he is awaiting an eval - has passed SLP eval  - diet started  - he states today that his double vision has resolved - on exam, his speech has improved significantly and memory appears to be improving as well  Mild Hyperlipidemia - statin started- need for dietary changes discussed- will need to follow  Nicotine abuse - has been counseled - mild centrilobular emphysema noted in lungs on CTA- needs outpt PFTs  Elevated Hb- mild Microcytosis- MCV 73.1 - Hb 18.4 yesterday and 17.3 today - f/u on anemia panel and follow Hb - ? Polycythemia-  he is a smoker but has no hypoxia on exam - needs to be followed up as outpt  Time spent  in minutes: 35 DVT prophylaxis: Lovenox Code Status: Full code Family Communication:  Disposition Plan: f/u on neuro work up Consultants:   Neurology Procedures:  2 d ECHO  1. The left ventricle has normal systolic function, with an ejection fraction of 55-60%. The cavity size was normal. Left ventricular diastolic Doppler parameters are consistent with impaired relaxation. No evidence of left ventricular regional wall  motion abnormalities.  2. The right ventricle has normal systolic function. The cavity was normal. There is no increase in right ventricular wall thickness.  3. No evidence of mitral valve stenosis. No mitral regurgitation.  4. The aortic valve is tricuspid. No stenosis of the aortic valve.  5. The aortic root is normal in size and structure.   6. The IVC was normal in size. No complete TR doppler jet so unable to estimate PA systolic pressure. Antimicrobials:  Anti-infectives (From admission, onward)   None       Objective: Vitals:   05/22/19 1603 05/22/19 1947 05/22/19 2317 05/23/19 0419  BP: 112/71 111/75 116/78 107/73  Pulse: 62 64 (!) 59 66  Resp: Temp: 98.4 F (36.9 C) 98.3 F (36.8 C) 98.6 F (37 C) 97.9 F (36.6 C)  TempSrc: Oral Oral Oral   SpO2: 100% 100% 100% 100%  Weight:      Height:        Intake/Output Summary (Last  24 hours) at 05/23/2019 0254 Last data filed at 05/22/2019 2317 Gross per 24 hour  Intake --  Output 300 ml  Net -300 ml   Filed Weights   05/22/19 0148  Weight: 73.1 kg    Examination: General exam: Appears comfortable  HEENT: PERRLA, oral mucosa moist, no sclera icterus or thrush Respiratory system: Clear to auscultation. Respiratory effort normal. Cardiovascular system: S1 & S2 heard,  No murmurs  Gastrointestinal system: Abdomen soft, non-tender, nondistended. Normal bowel sounds   Central nervous system: Alert and oriented. No focal neurological deficits. Extremities: No cyanosis, clubbing or  edema Skin: No rashes or ulcers Psychiatry:  Mood & affect appropriate.   Data Reviewed: I have personally reviewed following labs and imaging studies  CBC: Recent Labs  Lab 05/21/19 1458 05/21/19 1507 05/23/19 0439  WBC 7.3  --  5.7  NEUTROABS 4.4  --   --   HGB 16.7 18.4* 17.3*  HCT 53.0* 54.0* 52.8*  MCV 76.0*  --  73.1*  PLT 226  --  270   Basic Metabolic Panel: Recent Labs  Lab 05/21/19 1458 05/21/19 1507  NA 141 139  K 4.4 4.3  CL 104 104  CO2 30  --   GLUCOSE 95 89  BUN 12 11  CREATININE 1.12 1.10  CALCIUM 9.0  --    GFR: Estimated Creatinine Clearance: 85.8 mL/min (by C-G formula based on SCr of 1.1 mg/dL). Liver Function Tests: Recent Labs  Lab 05/21/19 1458  AST 24  ALT 44  ALKPHOS 52  BILITOT 0.5  PROT 6.0*  ALBUMIN 3.3*   No results for input(s): LIPASE, AMYLASE in the last 168 hours. No results for input(s): AMMONIA in the last 168 hours. Coagulation Profile: Recent Labs  Lab 05/21/19 1458  INR 0.8   Cardiac Enzymes: No results for input(s): CKTOTAL, CKMB, CKMBINDEX, TROPONINI in the last 168 hours. BNP (last 3 results) No results for input(s): PROBNP in the last 8760 hours. HbA1C: Recent Labs    05/22/19 0915  HGBA1C 5.7*   CBG: Recent Labs  Lab 05/21/19 1423  GLUCAP 136*   Lipid Profile: Recent Labs    05/22/19 0915  CHOL 170  HDL 44  LDLCALC 114*  TRIG 61  CHOLHDL 3.9   Thyroid Function Tests: No results for input(s): TSH, T4TOTAL, FREET4, T3FREE, THYROIDAB in the last 72 hours. Anemia Panel: Recent Labs    05/23/19 0750  RETICCTPCT 1.2   Urine analysis: No results found for: COLORURINE, APPEARANCEUR, LABSPEC, PHURINE, GLUCOSEU, HGBUR, BILIRUBINUR, KETONESUR, PROTEINUR, UROBILINOGEN, NITRITE, LEUKOCYTESUR Sepsis Labs: @LABRCNTIP (procalcitonin:4,lacticidven:4) ) Recent Results (from the past 240 hour(s))  SARS Coronavirus 2 (CEPHEID - Performed in [place] hospital lab), Hosp Order     Status: None    Collection Time: 05/21/19  7:13 PM   Specimen: Nasopharyngeal Swab  Result Value Ref Range Status   SARS Coronavirus 2 NEGATIVE NEGATIVE Final    Comment: (NOTE) If result is NEGATIVE SARS-CoV-2 target nucleic acids are NOT DETECTED. The SARS-CoV-2 RNA is generally detectable in upper and lower  respiratory specimens during the acute phase of infection. The lowest  concentration of SARS-CoV-2 viral copies this assay can detect is 250  copies / mL. A negative result does not preclude SARS-CoV-2 infection  and should not be used as the sole basis for treatment or other  patient management decisions.  A negative result may occur with  improper specimen collection / handling, submission of specimen other  than nasopharyngeal swab, presence of viral mutation(s)  within the  areas targeted by this assay, and inadequate number of viral copies  (<250 copies / mL). A negative result must be combined with clinical  observations, patient history, and epidemiological information. If result is POSITIVE SARS-CoV-2 target nucleic acids are DETECTED. The SARS-CoV-2 RNA is generally detectable in upper and lower  respiratory specimens dur ing the acute phase of infection.  Positive  results are indicative of active infection with SARS-CoV-2.  Clinical  correlation with patient history and other diagnostic information is  necessary to determine patient infection status.  Positive results do  not rule out bacterial infection or co-infection with other viruses. If result is PRESUMPTIVE POSTIVE SARS-CoV-2 nucleic acids MAY BE PRESENT.   A presumptive positive result was obtained on the submitted specimen  and confirmed on repeat testing.  While 2019 novel coronavirus  (SARS-CoV-2) nucleic acids may be present in the submitted sample  additional confirmatory testing may be necessary for epidemiological  and / or clinical management purposes  to differentiate between  SARS-CoV-2 and other Sarbecovirus  currently known to infect humans.  If clinically indicated additional testing with an alternate test  methodology [phone]([id number]) is advised. The SARS-CoV-2 RNA is generally  detectable in upper and lower respiratory sp ecimens during the acute  phase of infection. The expected result is Negative. Fact Sheet for Patients:  BoilerBrush.com.cyhttps://www.fda.gov/media/136312/download Fact Sheet for Healthcare Providers: https://pope.com/https://www.fda.gov/media/136313/download This test is not yet approved or cleared by the [place] FDA and has been authorized for detection and/or diagnosis of SARS-CoV-2 by FDA under an Emergency Use Authorization (EUA).  This EUA will remain in effect (meaning this test can be used) for the duration of the COVID-19 declaration under Section 564(b)(1) of the Act, 21 U.S.C. section 360bbb-3(b)(1), unless the authorization is terminated or revoked sooner. Performed at [facility], [address] [place], [place] [address]          Radiology Studies: Ct Angio Head W Or Wo Contrast  Result Date: 05/22/2019 CLINICAL DATA:  Stroke follow-up.  Headache today EXAM: CT ANGIOGRAPHY HEAD AND NECK TECHNIQUE: Multidetector CT imaging of the head and neck was performed using the standard protocol during bolus administration of intravenous contrast. Multiplanar CT image reconstructions and MIPs were obtained to evaluate the vascular anatomy. Carotid stenosis measurements (when applicable) are obtained utilizing NASCET criteria, using the distal internal carotid diameter as the denominator. CONTRAST:  75mL OMNIPAQUE IOHEXOL 350 MG/ML SOLN COMPARISON:  Brain MRI from yesterday FINDINGS: CTA NECK FINDINGS Aortic arch: Normal. Right carotid system: Vessels are smooth and widely patent. No atheromatous changes. Left carotid system: Vessels are smooth and widely patent. No atheromatous changes. Vertebral arteries: No proximal subclavian or vertebral stenosis or beading. Skeleton: No acute  or aggressive finding Other neck: Negative Upper chest: Mild centrilobular emphysema. Presumed mucus in the trachea. Review of the MIP images confirms the above findings CTA HEAD FINDINGS Anterior circulation: Negative for branch occlusion, beading, stenosis, or calcified plaque. Negative for aneurysm Posterior circulation: Vertebral and basilar arteries are smooth and widely patent. 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 Negative for branch occlusion or flow limiting stenosis. Negative for vessel beading. Negative for aneurysm (when accounting for tortuosity of the proximal left PICA as seen 3D renderings). Venous sinuses: Patent Anatomic variants: As above Delayed phase: At obtained Review of the MIP images confirms the above findings IMPRESSION: 1. Negative CTA of the head and neck. 2. Mild centrilobular emphysema. Electronically Signed   By: Marja KaysJonathon  Watts M.D.   On: 05/22/2019 11:34   Dg Chest 2 View  Result Date: 05/22/2019 CLINICAL DATA:  48 year old male with altered mental status. EXAM: CHEST - 2 VIEW COMPARISON:  Chest radiograph dated 02/13/2010 FINDINGS: The heart size and mediastinal contours are within normal limits. Both lungs are clear. The visualized skeletal structures are unremarkable. IMPRESSION: No active cardiopulmonary disease. Electronically Signed   By: Elgie CollardArash  Radparvar M.D.   On: 05/22/2019 00:45   Ct Head Wo Contrast  Result Date: 05/21/2019 CLINICAL DATA:  Headache and confusion with blurred vision EXAM: CT HEAD WITHOUT CONTRAST TECHNIQUE: Contiguous axial images were obtained from the base of the skull through the vertex without intravenous contrast. COMPARISON:  None. FINDINGS: Brain: The ventricles are normal in size and configuration. There is no intracranial mass, hemorrhage, extra-axial fluid collection, or midline shift. The brain parenchyma appears unremarkable. No acute infarct evident. Vascular: No hyperdense vessel. No vascular  calcifications are appreciable. Skull: The bony calvarium appears intact. Sinuses/Orbits: There is mild mucosal thickening in several ethmoid air cells. Other visualized paranasal sinuses are clear. Orbits appear symmetric bilaterally. Other: Mastoid air cells are clear. IMPRESSION: Mild mucosal thickening in several ethmoid air cells. Study otherwise unremarkable. Electronically Signed   By: Bretta BangWilliam  Woodruff III M.D.   On: 05/21/2019 15:34   Ct Angio Neck W Or Wo Contrast  Result Date: 05/22/2019 CLINICAL DATA:  Stroke follow-up.  Headache today EXAM: CT ANGIOGRAPHY HEAD AND NECK TECHNIQUE: Multidetector CT imaging of the head and neck was performed using the standard protocol during bolus administration of intravenous contrast. Multiplanar CT image reconstructions and MIPs were obtained to evaluate the vascular anatomy. Carotid stenosis measurements (when applicable) are obtained utilizing NASCET criteria, using the distal internal carotid diameter as the denominator. CONTRAST:  75mL OMNIPAQUE IOHEXOL 350 MG/ML SOLN COMPARISON:  Brain MRI from yesterday FINDINGS: CTA NECK FINDINGS Aortic arch: Normal. Right carotid system: Vessels are smooth and widely patent. No atheromatous changes. Left carotid system: Vessels are smooth and widely patent. No atheromatous changes. Vertebral arteries: No proximal subclavian or vertebral stenosis or beading. Skeleton: No acute or aggressive finding Other neck: Negative Upper chest: Mild centrilobular emphysema. Presumed mucus in the trachea. Review of the MIP images confirms the above findings CTA HEAD FINDINGS Anterior circulation: Negative for branch occlusion, beading, stenosis, or calcified plaque. Negative for aneurysm Posterior circulation: Vertebral and basilar arteries are smooth and widely patent. 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 Negative for branch occlusion or flow limiting stenosis. Negative for vessel beading.  Negative for aneurysm (when accounting for tortuosity of the proximal left PICA as seen 3D renderings). Venous sinuses: Patent Anatomic variants: As above Delayed phase: At obtained Review of the MIP images confirms the above findings IMPRESSION: 1. Negative CTA of the head and neck. 2. Mild centrilobular emphysema. Electronically Signed   By: Marnee SpringJonathon  Watts M.D.   On: 05/22/2019 11:34   Mr Brain Wo Contrast  Result Date: 05/21/2019 CLINICAL DATA:  Initial evaluation for acute blurry vision, headache. EXAM: MRI HEAD WITHOUT CONTRAST TECHNIQUE: Multiplanar, multiecho pulse sequences of the brain and surrounding structures were obtained without intravenous contrast. COMPARISON:  Prior CT from earlier the same day. FINDINGS: Brain: Examination moderately degraded by motion artifact. Cerebral volume within normal limits for age. Minimal chronic microvascular ischemic changes noted within the periventricular white matter. 1.3 x 1.5 x 2.2 cm acute ischemic nonhemorrhagic infarcts seen involving the ventral medial left thalamus (series 3, image 28). No  associated mass effect. No other evidence for acute or subacute ischemia. Gray-white matter differentiation otherwise maintained. No other areas of chronic infarction. No foci of susceptibility artifact to suggest acute or chronic intracranial hemorrhage. No mass lesion, midline shift or mass effect. No hydrocephalus. No extra-axial fluid collection. Pituitary gland within normal limits. Vascular: Major intracranial vascular flow voids are maintained. Skull and upper cervical spine: Craniocervical junction within normal limits. Upper cervical spine normal. Bone marrow signal intensity within normal limits. No scalp soft tissue abnormality. Sinuses/Orbits: Globes and orbital soft tissues within normal limits. Paranasal sinuses are clear. No mastoid effusion. Other: None. IMPRESSION: 1. 1.3 x 1.5 x 2.2 cm acute ischemic nonhemorrhagic infarct involving the ventromedial  left thalamus. 2. Underlying mild chronic microvascular ischemic disease for age. Electronically Signed   By: Rise MuBenjamin  McClintock M.D.   On: 05/21/2019 18:31      Scheduled Meds:  atorvastatin  40 mg Oral q1800   clopidogrel  75 mg Oral Daily   enoxaparin (LOVENOX) injection  40 mg Subcutaneous Daily   Continuous Infusions:  sodium chloride       LOS: 2 days      Calvert CantorSaima Sidi Dzikowski, MD [facility] Pager: [email or web address] Password [facility] 05/23/2019, 9:04 AM

## 2019-05-23 NOTE — Progress Notes (Signed)
STROKE TEAM PROGRESS NOTE   INTERVAL HISTORY Patient is neurologically stable.  Still having some speech and word finding difficulties.  He had TEE today which was unremarkable.  Patient cannot afford loop recorder as it does not have insurance.  Cardiology plan 30day outpatient heart monitor after he gets Medicaid  Vitals:   05/22/19 2317 05/23/19 0419 05/23/19 0900 05/23/19 1128  BP: 116/78 107/73 120/84 (!) 122/93  Pulse: (!) 59 66 62   Resp: 13 19 17 13   Temp: 98.6 F (37 C) 97.9 F (36.6 C) 97.6 F (36.4 C) 98.3 F (36.8 C)  TempSrc: Oral  Oral Oral  SpO2: 100% 100% 100% 98%  Weight:      Height:        CBC:  Recent Labs  Lab 05/21/19 1458 05/21/19 1507 05/23/19 0439  WBC 7.3  --  5.7  NEUTROABS 4.4  --   --   HGB 16.7 18.4* 17.3*  HCT 53.0* 54.0* 52.8*  MCV 76.0*  --  73.1*  PLT 226  --  256    Basic Metabolic Panel:  Recent Labs  Lab 05/21/19 1458 05/21/19 1507  NA 141 139  K 4.4 4.3  CL 104 104  CO2 30  --   GLUCOSE 95 89  BUN 12 11  CREATININE 1.12 1.10  CALCIUM 9.0  --    Lipid Panel:     Component Value Date/Time   CHOL 170 05/22/2019 0915   TRIG 61 05/22/2019 0915   HDL 44 05/22/2019 0915   CHOLHDL 3.9 05/22/2019 0915   VLDL 12 05/22/2019 0915   LDLCALC 114 (H) 05/22/2019 0915   HgbA1c:  Lab Results  Component Value Date   HGBA1C 5.7 (H) 05/22/2019   IMAGING Ct Angio Head W Or Wo Contrast  Result Date: 05/22/2019 CLINICAL DATA:  Stroke follow-up.  Headache today EXAM: CT ANGIOGRAPHY HEAD AND NECK TECHNIQUE: Multidetector CT imaging of the head and neck was performed using the standard protocol during bolus administration of intravenous contrast. Multiplanar CT image reconstructions and MIPs were obtained to evaluate the vascular anatomy. Carotid stenosis measurements (when applicable) are obtained utilizing NASCET criteria, using the distal internal carotid diameter as the denominator. CONTRAST:  75mL OMNIPAQUE IOHEXOL 350 MG/ML SOLN  COMPARISON:  Brain MRI from yesterday FINDINGS: CTA NECK FINDINGS Aortic arch: Normal. Right carotid system: Vessels are smooth and widely patent. No atheromatous changes. Left carotid system: Vessels are smooth and widely patent. No atheromatous changes. Vertebral arteries: No proximal subclavian or vertebral stenosis or beading. Skeleton: No acute or aggressive finding Other neck: Negative Upper chest: Mild centrilobular emphysema. Presumed mucus in the trachea. Review of the MIP images confirms the above findings CTA HEAD FINDINGS Anterior circulation: Negative for branch occlusion, beading, stenosis, or calcified plaque. Negative for aneurysm Posterior circulation: Vertebral and basilar arteries are smooth and widely patent. 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 Negative for branch occlusion or flow limiting stenosis. Negative for vessel beading. Negative for aneurysm (when accounting for tortuosity of the proximal left PICA as seen 3D renderings). Venous sinuses: Patent Anatomic variants: As above Delayed phase: At obtained Review of the MIP images confirms the above findings IMPRESSION: 1. Negative CTA of the head and neck. 2. Mild centrilobular emphysema. Electronically Signed   By: Marnee SpringJonathon  Watts M.D.   On: 05/22/2019 11:34   Dg Chest 2 View  Result Date: 05/22/2019 CLINICAL DATA:  48 year old male with altered mental status. EXAM: CHEST - 2 VIEW  COMPARISON:  Chest radiograph dated 02/13/2010 FINDINGS: The heart size and mediastinal contours are within normal limits. Both lungs are clear. The visualized skeletal structures are unremarkable. IMPRESSION: No active cardiopulmonary disease. Electronically Signed   By: Elgie CollardArash  Radparvar M.D.   On: 05/22/2019 00:45   Ct Head Wo Contrast  Result Date: 05/21/2019 CLINICAL DATA:  Headache and confusion with blurred vision EXAM: CT HEAD WITHOUT CONTRAST TECHNIQUE: Contiguous axial images were obtained from the base of  the skull through the vertex without intravenous contrast. COMPARISON:  None. FINDINGS: Brain: The ventricles are normal in size and configuration. There is no intracranial mass, hemorrhage, extra-axial fluid collection, or midline shift. The brain parenchyma appears unremarkable. No acute infarct evident. Vascular: No hyperdense vessel. No vascular calcifications are appreciable. Skull: The bony calvarium appears intact. Sinuses/Orbits: There is mild mucosal thickening in several ethmoid air cells. Other visualized paranasal sinuses are clear. Orbits appear symmetric bilaterally. Other: Mastoid air cells are clear. IMPRESSION: Mild mucosal thickening in several ethmoid air cells. Study otherwise unremarkable. Electronically Signed   By: Bretta BangWilliam  Woodruff III M.D.   On: 05/21/2019 15:34   Ct Angio Neck W Or Wo Contrast  Result Date: 05/22/2019 CLINICAL DATA:  Stroke follow-up.  Headache today EXAM: CT ANGIOGRAPHY HEAD AND NECK TECHNIQUE: Multidetector CT imaging of the head and neck was performed using the standard protocol during bolus administration of intravenous contrast. Multiplanar CT image reconstructions and MIPs were obtained to evaluate the vascular anatomy. Carotid stenosis measurements (when applicable) are obtained utilizing NASCET criteria, using the distal internal carotid diameter as the denominator. CONTRAST:  75mL OMNIPAQUE IOHEXOL 350 MG/ML SOLN COMPARISON:  Brain MRI from yesterday FINDINGS: CTA NECK FINDINGS Aortic arch: Normal. Right carotid system: Vessels are smooth and widely patent. No atheromatous changes. Left carotid system: Vessels are smooth and widely patent. No atheromatous changes. Vertebral arteries: No proximal subclavian or vertebral stenosis or beading. Skeleton: No acute or aggressive finding Other neck: Negative Upper chest: Mild centrilobular emphysema. Presumed mucus in the trachea. Review of the MIP images confirms the above findings CTA HEAD FINDINGS Anterior  circulation: Negative for branch occlusion, beading, stenosis, or calcified plaque. Negative for aneurysm Posterior circulation: Vertebral and basilar arteries are smooth and widely patent. 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 Negative for branch occlusion or flow limiting stenosis. Negative for vessel beading. Negative for aneurysm (when accounting for tortuosity of the proximal left PICA as seen 3D renderings). Venous sinuses: Patent Anatomic variants: As above Delayed phase: At obtained Review of the MIP images confirms the above findings IMPRESSION: 1. Negative CTA of the head and neck. 2. Mild centrilobular emphysema. Electronically Signed   By: Marnee SpringJonathon  Watts M.D.   On: 05/22/2019 11:34   Mr Brain Wo Contrast  Result Date: 05/21/2019 CLINICAL DATA:  Initial evaluation for acute blurry vision, headache. EXAM: MRI HEAD WITHOUT CONTRAST TECHNIQUE: Multiplanar, multiecho pulse sequences of the brain and surrounding structures were obtained without intravenous contrast. COMPARISON:  Prior CT from earlier the same day. FINDINGS: Brain: Examination moderately degraded by motion artifact. Cerebral volume within normal limits for age. Minimal chronic microvascular ischemic changes noted within the periventricular white matter. 1.3 x 1.5 x 2.2 cm acute ischemic nonhemorrhagic infarcts seen involving the ventral medial left thalamus (series 3, image 28). No associated mass effect. No other evidence for acute or subacute ischemia. Gray-white matter differentiation otherwise maintained. No other areas of chronic infarction. No foci of susceptibility artifact to suggest acute or  chronic intracranial hemorrhage. No mass lesion, midline shift or mass effect. No hydrocephalus. No extra-axial fluid collection. Pituitary gland within normal limits. Vascular: Major intracranial vascular flow voids are maintained. Skull and upper cervical spine: Craniocervical junction within normal  limits. Upper cervical spine normal. Bone marrow signal intensity within normal limits. No scalp soft tissue abnormality. Sinuses/Orbits: Globes and orbital soft tissues within normal limits. Paranasal sinuses are clear. No mastoid effusion. Other: None. IMPRESSION: 1. 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 2. Underlying mild chronic microvascular ischemic disease for age. Electronically Signed   By: Jeannine Boga M.D.   On: 05/21/2019 18:31    PHYSICAL EXAM Pleasant middle-aged African male not in distress. . Afebrile. Head is nontraumatic. Neck is supple without bruit.    Cardiac exam no murmur or gallop. Lungs are clear to auscultation. Distal pulses are well felt. Neurological Exam ;  Awake  Alert oriented x 3.  Slow hesitant slightly nonfluent speech and often mumbles some answers.  Able to name repeat and comprehend well.  Eye movements full without nystagmus.fundi were not visualized. Vision acuity and fields appear normal. Hearing is normal. Palatal movements are normal. Face asymmetric.  With mild right lower facial weakness.  Tongue midline. Normal strength, tone, reflexes and coordination except diminished fine finger movements on the right and orbits left over right upper extremity.. Normal sensation. Gait deferred.  ASSESSMENT/PLAN [name] is a 48 y.o. male with no known past medical history presenting with blurred vision, possible confusion in setting of headache.   Stroke:   L thalamic infarct  - possibly small vessel disease but cannot rule out embolic source d/t large size  CT head mild mucosal thickening in ethmoid air cells.  Otherwise unremarkable.  MRI  L thalamic infarct. Significant small vessel disease for age.   CTA head & neck Unremarkable. Mild centrilobar emphysema  Transcranial Doppler with bubble pending  2D Echo EF 55-60%. No source of embolus   TEE  normalloop placed 6/25 to look for  embolic source  LDL 144  HgbA1c 5.7  UDS pending   Lovenox 40 mg sq daily for VTE prophylaxis  No antithrombotic prior to admission, now on clopidogrel 75 mg daily following aspirin and Plavix load.  Patient has a documented history to aspirin. Continue plavix alone at time of d/c  Therapy recommendations:  CIR  Disposition:  pending (pt does not have insurance coverage for CIR. CM to follow up)  Consider [place] trial.  [facility] will follow up for possible enrollment.  Hyperlipidemia  Home meds:  No statin  Now on lipitor 40  LDL 114, goal < 70  Continue statin at discharge  Other Stroke Risk Factors  Cigarette smoker-half pack per day, advised to stop smoking  ETOH use, advised to drink no more than 2 drink(s) a day  Family hx stroke (mother)  Other problems  Elevated HGB w/ mild microcytosis. For OP follow up  Hospital day # 2  Patient presented with a large thalamic infarct possibly of cryptogenic etiology.  Work-up so far has been unremarkable.  Await ANA and anticardiolipin antibodies.  Patient will not be able to afford a loop recorder ends 30-day outpatient heart monitor has been recommended.  Aspirin and Plavix for 3 weeks followed by aspirin alone.  Follow-up as an outpatient in the stroke clinic in 6 weeks.  Consider possible participation in the [place] trial if interested.  Stroke team will sign  off.  Discussed with Dr. Edger Houseizwan  Dulcie Gammon, MD Medical Director [facility] Pager: [phone] 05/23/2019 11:46 AM   To contact Stroke Continuity provider, please refer to [email or web address]. After hours, contact General Neurology

## 2019-05-23 NOTE — TOC Transition Note (Signed)
Transition of Care [facility]) - CM/SW Discharge Note   Patient Details  Name: Brandon Summers MRN: [id number] Date of Birth: [birth year]  [facility]) CM/SW Contact:  Pollie Friar, RN Phone Number: 05/23/2019, 4:56 PM   Clinical Narrative:    Pt doing to well for CIR. Pts insurance doesn't cover [facility] services. Plan is for outpatient therapy. Orders in Epic and information on the AVS. Pt without a PCP. CM was able to get him an appt at [facility]. Information on his AVS. Wife to provide transport home.   Final next level of care: OP Rehab Barriers to Discharge: Inadequate or no insurance   Patient Goals and CMS Choice     Choice offered to / list presented to : Patient  Discharge Placement                       Discharge Plan and Services   Discharge Planning Services: CM Consult                                 Social Determinants of Health (SDOH) Interventions     Readmission Risk Interventions No flowsheet data found.

## 2019-05-23 NOTE — Progress Notes (Signed)
  Speech Language Pathology Treatment: Cognitive-Linquistic(Aphasia )  Patient Details Name: Brandon Summers MRN: [id number] DOB: 1971-07-10 Today's Date: 05/23/2019 Time: 2637-8588 SLP Time Calculation (min) (ACUTE ONLY): 17 min  Assessment / Plan / Recommendation Clinical Impression  Pt was seen for aphasia treatment and was cooperative throughout the session but continues to demonstrate some confusion regarding tasks. The session was abbreviated to facilitate completion of a procedure. He provided 2-5 items per concrete category within 1 minute during divergent naming tasks with an average of 3 items per category. He responded to complex yes/no questions with 60% accuracy increasing to 70% with rephrasing. He achieved 60% accuracy with responsive naming increasing to 100% with mod cues. SLP will continue to follow pt.    HPI HPI: Brandon Summers is a 48 y.o. male with no known medical history presents to the emergency department for evaluation of blurry vision and difficulty holding objects, slurred speech and general weakness.  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      SLP Plan  Continue with current plan of care       Recommendations                   Follow up Recommendations: Outpatient SLP SLP Visit Diagnosis: Aphasia (R47.01) Plan: Continue with current plan of care       Adrie Picking I. Hardin Negus, [place], [place] Office number [phone] Pager [place] 05/23/2019, 3:19 PM

## 2019-05-24 ENCOUNTER — Telehealth: Payer: Self-pay | Admitting: Radiology

## 2019-05-24 LAB — ANA W/REFLEX IF POSITIVE: Anti Nuclear Antibody (ANA): NEGATIVE

## 2019-05-24 NOTE — Telephone Encounter (Signed)
Enrolled patient for a 30 Day [employer] Event monitor to be mailed. Brief instructions were gone over with the patient and his wife and they know to expect the monitor to arrive in 3-4 days. Also patient is currently selfpay instructed them to talk to [employer] about there selfpay options

## 2019-05-25 LAB — CARDIOLIPIN ANTIBODIES, IGG, IGM, IGA
Anticardiolipin IgA: <9 APL U/mL (ref 0–11)
Anticardiolipin IgG: <9 GPL U/mL (ref 0–14)
Anticardiolipin IgM: <9 MPL U/mL (ref 0–12)

## 2019-05-27 ENCOUNTER — Other Ambulatory Visit: Payer: Self-pay

## 2019-05-27 NOTE — Patient Outreach (Signed)
[facility]) Care Management  05/27/2019  Treyvon Blahut [birth year] [id number]  EMMI: stroke Referral date: 05/27/19 Referral reason: filled new prescriptions: no,  Know how/ when to take medicines: no,   Problems setting up rehab: yes Insurance:  None listed Day # 1  Attempt #1  Telephone call to patient regarding EMMI stroke red alert. Unable to reach patient. HIPAA compliant voice message left with call back phone number.   PLAN: RNCm will attempt 2nd telephone call to patient within 4 business days.  Quinn Plowman RN,BSN,CCM [facility] Telephonic  [phone]

## 2019-05-29 ENCOUNTER — Other Ambulatory Visit: Payer: Self-pay

## 2019-05-29 NOTE — Patient Outreach (Signed)
[facility] [facility]) Care Management  05/29/2019  Januel Doolan 02-28-1971 [id number]   EMMI: stroke Referral date: 05/27/19 Referral reason: filled new prescriptions: no,  Know how/ when to take medicines: no,   Problems setting up rehab: yes Insurance:  None listed Day # 1  Attempt #1  Telephone call to patient regarding EMMI stroke red alert.  Unable to reach patient. HIPAA compliant voice message left with call back phone number.   PLAN: RNCM will attempt 2nd telephone call to patient within 4 business days.   Quinn Plowman RN,BSN, [place] Telephonic  [phone]

## 2019-05-30 ENCOUNTER — Encounter: Payer: Self-pay | Admitting: Occupational Therapy

## 2019-05-30 ENCOUNTER — Ambulatory Visit: Payer: PRIVATE HEALTH INSURANCE | Attending: Internal Medicine | Admitting: Occupational Therapy

## 2019-05-30 ENCOUNTER — Ambulatory Visit: Payer: PRIVATE HEALTH INSURANCE | Admitting: Speech Pathology

## 2019-05-30 ENCOUNTER — Other Ambulatory Visit: Payer: Self-pay

## 2019-05-30 ENCOUNTER — Encounter: Payer: Self-pay | Admitting: Speech Pathology

## 2019-05-30 VITALS — BP 131/79 | HR 76

## 2019-05-30 DIAGNOSIS — R41840 Attention and concentration deficit: Secondary | ICD-10-CM

## 2019-05-30 DIAGNOSIS — R471 Dysarthria and anarthria: Secondary | ICD-10-CM | POA: Diagnosis present

## 2019-05-30 DIAGNOSIS — R2689 Other abnormalities of gait and mobility: Secondary | ICD-10-CM | POA: Diagnosis present

## 2019-05-30 DIAGNOSIS — R41841 Cognitive communication deficit: Secondary | ICD-10-CM | POA: Insufficient documentation

## 2019-05-30 DIAGNOSIS — I69318 Other symptoms and signs involving cognitive functions following cerebral infarction: Secondary | ICD-10-CM

## 2019-05-30 DIAGNOSIS — R4701 Aphasia: Secondary | ICD-10-CM

## 2019-05-30 DIAGNOSIS — R29818 Other symptoms and signs involving the nervous system: Secondary | ICD-10-CM | POA: Diagnosis present

## 2019-05-30 NOTE — Therapy (Addendum)
[facility] [address] [place], [place], [address] Phone: [phone]   Fax:  [phone]  Occupational Therapy Evaluation  Patient Details  Name: Brandon Summers MRN: [id number] Date of Birth: [birth year] Referring Provider (OT): Lanae Boast, FNP  (PCP appt to establish care 06/07/19)   Encounter Date: 05/30/2019  OT End of Session - 05/30/19 1126    Visit Number  1    Number of Visits  9    Date for OT Re-Evaluation  07/29/19    OT Start Time  1015    OT Stop Time  1100    OT Time Calculation (min)  45 min    Activity Tolerance  Patient tolerated treatment well    Behavior During Therapy  [facility] for tasks assessed/performed       Past Medical History:  Diagnosis Date  . Stroke (cerebrum) ([place])   . Tobacco abuse     Past Surgical History:  Procedure Laterality Date  . BUBBLE STUDY  05/23/2019   Procedure: BUBBLE STUDY;  Surgeon: Pixie Casino, MD;  Location: [place];  Service: Cardiovascular;;  . TEE WITHOUT CARDIOVERSION N/A 05/23/2019   Procedure: TRANSESOPHAGEAL ECHOCARDIOGRAM (TEE);  Surgeon: Pixie Casino, MD;  Location: [facility] ENDOSCOPY;  Service: Cardiovascular;  Laterality: N/A;    Vitals:   05/30/19 1029  BP: 131/79  Pulse: 76    Subjective Assessment - 05/30/19 1016    Subjective   couldn't see (double vision), but now  fine.  blurriness with near vision prior to CVA.  Never been to a doctor prior to this.    Patient is accompanied by:  Family member   ex-wife   Pertinent History  CVA 05/21/19; PMH:  HLD, nicotine abuse    Limitations  cognitive deficits, language deficits    Patient Stated Goals  return to work, independence    Currently in Pain?  No/denies        [facility] OT Assessment - 05/30/19 0001      Assessment   Medical Diagnosis  CVA    Referring Provider (OT)  Lanae Boast, FNP    PCP appt to establish care 06/07/19   Onset Date/Surgical Date  05/21/19    Hand Dominance  Right     Next MD Visit  06/07/19    to establish PCP   Prior Therapy  acute evals      Precautions   Precautions  --   no working     Balance Screen   Has the patient fallen in the past 6 months  No      Home  Environment   Family/patient expects to be discharged to:  Private residence    Lives With  Daughter   35 y.o. dtr.  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, forensic (as Biomedical scientist)     ADL   ADL comments  BADLs mod I      IADL   Prior Level of Function Light Housekeeping  independent--not yet attempted    Prior Level of Function Meal Prep  independent--not yet attempted    Prior Level of Function Community Mobility  driving    Colon on family or friends for transportation    Medication Management  Is responsible for taking medication in correct dosages at correct time  family set alarm on phone, family also supervises   Prior Level of Function Financial Management  independent (paid bills by cell phone)    Financial Management  Requires assistance      Mobility   Mobility Status  Independent   feels like he needs to concentrate to go up stairs     Vision - History   Baseline Vision  --   blurriness with reading prior, has never seen doctor     Vision Assessment   Tracking/Visual Pursuits  Able to track stimulus in all quads without difficulty    Diplopia Assessment  --   denies diplopia in any direction at this time   Comment  Pt reports blurriness with near vision/reading prior to CVA--has never seen eye doctor.  Pt with 32M visual scanning/number cancellation with 100% accuracy with incr time.  Simple environmental scanning with approx 80% accuracy initially, but then self-corrected (appeared to be due to decr understanding of directions initially)      Cognition   Overall Cognitive Status  Impaired/Different from  baseline   Cognition to be further assesed in functional context   Area of Impairment  Attention;Memory    Current Attention Level  Sustained   impaired   Attention Comments  difficulty remembering, hard to concentrate to go up steps    Memory  Decreased short-term memory    Memory Comments  difficulty using phone (locating app), difficulty remembering, pt reports difficulty/effort thinking, had to concentrate to go up steps; needed directions repeated due to forgetting instructions during session 1-2x.    Nature conservation officerxecutive Function  Organizing   impaired   Organizing  Impaired    Cognition Comments  difficulty using phone (locating app), difficulty remembering, pt reports difficulty/effort thinking, had to concentrate to go up steps; needed directions repeated due to forgetting instructions during session 1-2x.   difficult to fully determine due to language deficits     Sensation   Light Touch  Not tested   pt denies changes     Coordination   9 Hole Peg Test  Right;Left    Right 9 Hole Peg Test  30.75    Left 9 Hole Peg Test  28.44      ROM / Strength   AROM / PROM / Strength  AROM;Strength      AROM   Overall AROM   Within functional limits for tasks performed   BUEs     Strength   Overall Strength  Within functional limits for tasks performed    Overall Strength Comments  BUEs WNL      Hand Function   Right Hand Grip (lbs)  98    Left Hand Grip (lbs)  91                      OT Education - 05/30/19 1152    Education Details  OT eval results/POC; discussed financial concerns due to no insurance coverage for therapy (pt/family reports that he has applied for medicaid and would only like to schedule 1x/week currently); Recommended no cooking without supervision/assist and no driving due to cognitive deficits; Pt to discuss pain with bowel movements with PCP next week    Person(s) Educated  Patient;Caregiver(s)   ex-wife   Methods  Explanation    Comprehension   Verbalized understanding       OT Short Term Goals - 05/30/19 1210      OT SHORT TERM GOAL #1   Title  Pt/caregiver will verbalize  understanding of cognitive compensation strategies for incr safety/independence with ADLs/IADLs.--check STGs 06/29/19    Baseline  dependent    Time  4    Period  Weeks    Status  New      OT SHORT TERM GOAL #2   Title  Pt will perform simple cooking task mod I.    Baseline  dependent/has not attempted    Time  4    Period  Weeks    Status  New      OT SHORT TERM GOAL #3   Title  Pt will perform simple home maintenance task mod I.    Baseline  dependent/has not attempted    Time  4    Period  Weeks    Status  New      OT SHORT TERM GOAL #4   Title  Pt will perform simple functional organization/problem solving tasks with at least 90% accuracy.    Time  4    Period  Weeks    Status  New        OT Long Term Goals - 05/30/19 1213      OT LONG TERM GOAL #1   Title  Pt will perform simple alternating-divided attention activity (at least 1 cognitive and 1 physical task) with at least 90% accuracy for incr safety/in prep for return to work.    Baseline  pt reports difficulty with attending to simple physical task    Time  8    Period  Weeks    Status  New      OT LONG TERM GOAL #2   Title  Pt will perform mod complex cooking task mod I.    Baseline  dependent/has not attempted    Time  8    Period  Weeks    Status  New      OT LONG TERM GOAL #3   Title  Pt will perform mod complex home maintenance task mod I.    Baseline  dependent/has not attempted    Time  8    Period  Weeks    Status  New      OT LONG TERM GOAL #4   Title  Pt will perform previous financial management tasks with supervision.    Baseline  needs assist    Time  8    Period  Weeks    Status  New            Plan - 05/30/19 1156    Clinical Impression Statement  Pt is a 48 y.o. male s/p CVA.  Pt with hospitalization 05/21/19-05/23/19 (presented with confusion  and blurriness/diplopia).  Pt with PMH: of HDL, nicotine abuse.  Pt presents today with cognitive deficits (and language deficits).  Pt reports diplopia has resolved.  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  Pt currently not performing IADLs or able to work.  Pt would benefit from occupational therapy to address cognitive deficits for improved ADLs/IADL performance and safety.    OT Occupational Profile and History  Problem Focused Assessment - Including review of records relating to presenting problem    Occupational performance deficits (Please refer to evaluation for details):  ADL's;IADL's;Work;Leisure;Social Participation    Body Structure / Function / Physical Skills  ADL;Decreased knowledge of use of DME;Decreased knowledge of precautions    Cognitive Skills  Attention;Memory;Problem Solve;Safety Awareness;Thought;Understand    Rehab Potential  Good    Clinical Decision Making  Several treatment options, min-mod task modification necessary    Comorbidities Affecting Occupational Performance:  May have comorbidities impacting occupational performance    Modification or Assistance to Complete Evaluation   Min-Moderate modification of tasks or assist with assess necessary to complete eval    OT Frequency  2x / week   recommended, but 1x week at this time due to self pay/financial concerns at pt/family request   OT Duration  8 weeks    OT Treatment/Interventions  Self-care/ADL training;DME and/or AE instruction;Therapeutic activities;Cognitive remediation/compensation;Visual/perceptual remediation/compensation;Patient/family education    Plan  functional cognitive task with organization or visual scanning/attention with problem solving    Consulted and Agree with Plan of Care  Patient;Family member/caregiver    Family Member Consulted  ex-wife/caregiver       Patient will benefit from skilled therapeutic intervention in order to improve the following  deficits and impairments:   Body Structure / Function / Physical Skills: ADL, Decreased knowledge of use of DME, Decreased knowledge of precautions Cognitive Skills: Attention, Memory, Problem Solve, Safety Awareness, Thought, Understand     Visit Diagnosis: 1. Other symptoms and signs involving cognitive functions following cerebral infarction   2. Attention and concentration deficit       Problem List Patient Active Problem List   Diagnosis Date Noted  . HLD (hyperlipidemia) 05/23/2019  . Nicotine abuse 05/23/2019  . CVA (cerebral vascular accident) [facility](HCC) 05/21/2019    [facility] Surgery CenterFREEMAN,Oris Staffieri 05/30/2019, 12:29 PM  [facility] [address] [place], [place], [address] Phone: [phone]   Fax:  [phone]  Name: Otilio Miumadou Laury MRN: [id number] Date of Birth: 06/06/1971   Willa FraterAngela Maylynn Orzechowski, OTR/L [facility] [address] [place], [place]  [id number] [phone] phone [phone] 05/30/19 12:29 PM

## 2019-05-31 ENCOUNTER — Encounter (INDEPENDENT_AMBULATORY_CARE_PROVIDER_SITE_OTHER): Payer: PRIVATE HEALTH INSURANCE

## 2019-05-31 DIAGNOSIS — I4891 Unspecified atrial fibrillation: Secondary | ICD-10-CM

## 2019-05-31 DIAGNOSIS — I639 Cerebral infarction, unspecified: Secondary | ICD-10-CM

## 2019-06-03 ENCOUNTER — Other Ambulatory Visit: Payer: Self-pay

## 2019-06-03 NOTE — Patient Outreach (Signed)
[facility]) Care Management  06/03/2019  Brandon Summers [birth year] [id number]   EMMI- stroke RED ON EMMI ALERT Day # 9 Date: 06/02/2019 Red Alert Reason:  Sad, hopeless, anxious, or empty? Yes   Day #1 05/27/2019 Red Alert Reason: Filled new prescriptions?         No  Know how/when to take meds?         No  Problems setting up rehab?         Yes   Outreach attempt: spoke with patient.  He is able to verify HIPAA.  Addressed red alerts with patient.  He denies all red alerts.  He did say that he has been to rehab and that he has a follow up appointment with physician and denies any problems with transportation.  Patient denies any other questions or concerns at this time.     Plan: RN CM will close case.    Jone Baseman, RN, MSN [facility] Care Management Care Management Coordinator Direct Line [phone] Toll Free: [phone]  Fax: [phone]

## 2019-06-04 ENCOUNTER — Ambulatory Visit: Payer: Self-pay

## 2019-06-04 ENCOUNTER — Other Ambulatory Visit: Payer: Self-pay

## 2019-06-04 ENCOUNTER — Ambulatory Visit: Payer: PRIVATE HEALTH INSURANCE | Admitting: Speech Pathology

## 2019-06-04 DIAGNOSIS — R471 Dysarthria and anarthria: Secondary | ICD-10-CM

## 2019-06-04 DIAGNOSIS — I69318 Other symptoms and signs involving cognitive functions following cerebral infarction: Secondary | ICD-10-CM | POA: Diagnosis not present

## 2019-06-04 DIAGNOSIS — R41841 Cognitive communication deficit: Secondary | ICD-10-CM

## 2019-06-04 DIAGNOSIS — R4701 Aphasia: Secondary | ICD-10-CM

## 2019-06-04 NOTE — Patient Instructions (Signed)
I recommend getting a 3 ring binder that you can use for therapy and to keep a calendar. Make a section for each therapy (Speech, OT and PT)  Next speech appointment is 7/14 at 11:00 am.   Memory Compensation Strategies  1. Use "WARM" strategy. W= write it down A=  associate it R=  repeat it M=  make a mental picture  2. You can keep a Social worker. Use a 3-ring notebook with sections for the following:  calendar, important names and phone numbers, medications, doctors' names/phone numbers, "to do list"/reminders, and a section to journal what you did each day  3. Use a calendar to write appointments down.  4. Write yourself a schedule for the day.  This can be placed on the calendar or in a separate section of the Memory Notebook.  Keeping a regular schedule can help memory.  5. Use medication organizer with sections for each day or morning/evening pills  You may need help loading it  6. Keep a basket, or pegboard by the door.   Place items that you need to take out with you in the basket or on the pegboard.  You may also want to include a message board for reminders.  7. Use sticky notes. Place sticky notes with reminders in a place where the task is performed.  For example:  "turn off the stove" placed by the stove, "lock the door" placed on the door at eye level, "take your medications" on the bathroom mirror or by the place where you normally take your medications  8. Use alarms/timers.  Use while cooking to remind yourself to check on food or as a reminder to take your medicine, or as a reminder to make a call, or as a reminder to perform another task, etc.  9. Use a small tape recorder to record important information and notes for yourself.

## 2019-06-04 NOTE — Therapy (Signed)
[facility] [address] [place], [place], [address] Phone: [phone]   Fax:  [phone]  Speech Language Pathology Evaluation  Patient Details  Name: Brandon Summers MRN: [id number] Date of Birth: 04/01/1971 Referring Provider (SLP): Mike GipAndre Douglas, FNP (PCP appt to establish care 06/07/19)   Encounter Date: 05/30/2019  End of Session - 05/30/19 1100   Visit Number  1    Number of Visits  17    Date for SLP Re-Evaluation  07/29/19    Authorization Type  self-pay; has applied for Medicaid    SLP Start Time  1100    SLP Stop Time   1145    SLP Time Calculation (min)  45 min    Activity Tolerance  Patient tolerated treatment well       Past Medical History:  Diagnosis Date  . Stroke (cerebrum) (HCC)   . Tobacco abuse     Past Surgical History:  Procedure Laterality Date  . BUBBLE STUDY  05/23/2019   Procedure: BUBBLE STUDY;  Surgeon: Chrystie NoseHilty, Kenneth C, MD;  [facility];  Service: Cardiovascular;;  . TEE WITHOUT CARDIOVERSION N/A 05/23/2019   Procedure: TRANSESOPHAGEAL ECHOCARDIOGRAM (TEE);  Surgeon: Chrystie NoseHilty, Kenneth C, MD;  [facility];  Service: Cardiovascular;  Laterality: N/A;    There were no vitals filed for this visit.  Subjective Assessment - 05/30/19 1100   Subjective  "Hard to talk. Hard to remember stuff."    Currently in Pain?  No/denies         SLP Evaluation [facility] - 05/30/19 1100     SLP Visit Information   SLP Received On  05/30/19    Referring Provider (SLP) Mike GipAndre Douglas, FNP   Onset Date  05/21/19    Medical Diagnosis  CVA      Subjective   Subjective  Pt reports difficulties with speech and cognition      General Information   HPI  Brandon Summers is a 48 y.o. male with no known medical history [facility].  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    Behavioral/Cognition  alert,  cooperative    Mobility Status  ambulated to session      Balance Screen   Has the patient fallen in the past 6 months  No    Has the patient had a decrease in activity level because of a fear of falling?   No    Is the patient reluctant to leave their home because of a fear of falling?   No      Prior Functional Status   Cognitive/Linguistic Baseline  Within functional limits    Type of Home  Apartment     Lives With  Daughter    Available Support  Family;Available PRN/intermittently    Education  [employer]     Vocation  Full time employment   cook at [employer]     Pain Assessment   Pain Assessment  No/denies pain      Cognition   Overall Cognitive Status  Within Functional Limits for tasks assessed    Area of Impairment  Attention;Memory    Current Attention Level  Sustained   impaired   Attention Comments  looking out window, at items in room    Memory  Decreased short-term memory    Memory Comments  reports trouble remembering what he did the day before    Attention  Sustained;Selective;Alternating  Sustained Attention  Impaired    Selective Attention  Impaired   symbol cancellation impaired   Alternating Attention  Impaired   trailmaking impaired   Memory  Impaired    Memory Impairment  Decreased short term memory   working memory   Decreased Short Term Memory  Verbal basic;Functional basic   mod complex auditory comprehension deficit also contributes   Awareness  Impaired    Awareness Impairment  Emergent impairment;Anticipatory impairment    Problem Solving  Impaired    Systems developerxecutive Function  Organizing    Organizing  Impaired   clockdrawing, mazes severely impaired     Auditory Comprehension   Overall Auditory Comprehension  Impaired    Basic Biographical Questions  76-100% accurate    Complex Questions  0-24% accurate    Commands  Impaired    One Step Basic Commands  75-100% accurate    Two Step Basic Commands  75-100% accurate    Multistep Basic Commands   50-74% accurate    Conversation  Simple    Interfering Components  Attention;Processing speed;Working Higher education careers advisermemory    EffectiveTechniques  Extra processing time;Repetition   rephrasing; daughter reports need to do this frequently      IT trainerVisual Recognition/Discrimination   Discrimination  Within Function Limits      Reading Comprehension   Reading Status  Not tested      Expression   Primary Mode of Expression  Verbal      Verbal Expression   Overall Verbal Expression  Impaired    Initiation  Impaired   slow to initiate   Automatic Speech  Name;Social Response    Level of Generative/Spontaneous Verbalization  Sentence    Repetition  No impairment    Naming  Impairment    Confrontation  75-100% accurate   hesitation, self-corrects x2. accepted "beef"/cow dt dialect   Divergent  25-49% accurate    Other Naming Comments  4 animals in 60 seconds    Pragmatics  No impairment    Interfering Components  Attention    Effective Techniques  Semantic cues;Open ended questions      Written Expression   Dominant Hand  Right    Written Expression  Not tested      Oral Motor/Sensory Function   Overall Oral Motor/Sensory Function  Other (comment)   not formally assessed due to masking     Motor Speech   Overall Motor Speech  Impaired    Respiration  Within functional limits    Phonation  Low vocal intensity;Hoarse    Resonance  Within functional limits    Articulation  Within functional limitis    Intelligibility  Intelligible   in quiet environment     Standardized Assessments   Standardized Assessments   Cognitive Linguistic Quick Test   aphasia administration of CLQT, to be completed next session                     SLP Education - 05/30/19 1100    Education Details  proposed therapy goals, plan of care    Person(s) Educated  Patient;Child(ren)    Methods  Explanation    Comprehension  Verbalized understanding       SLP Short Term Goals - 05/30/19 1100     SLP  SHORT TERM GOAL #1   Title  Pt will demo selective attention to therapy tasks for 10 minutes x2 sessions.    Time  4    Period  Weeks    Status  New      SLP SHORT TERM GOAL #2   Title  Pt will establish a memory compensation system and bring to 2 therapy sessions.    Time  4    Period  Weeks    Status  New      SLP SHORT TERM GOAL #3   Title  Pt will verbally sequence steps in a functional task (eg recipes) with compensations for aphasia x3 sessions.    Time  4    Period  Weeks    Status  New      SLP SHORT TERM GOAL #4   Title  Pt will demo auditory comprehension of mod complex multistep commands >85% accuracy x 3 visits.    Time  4    Period  Weeks    Status  New      SLP SHORT TERM GOAL #5   Title  Pt will use compensations for aphasia and dysarthria in 8 minutes simple conversation, x 2 visits with rare min A.    Time  4    Period  Weeks    Status  New       SLP Long Term Goals - 05/30/19 1100     SLP LONG TERM GOAL #1   Title  Pt will demo alternating attention in a functional task for 15 minutes x2 sessions.    Time  8   or 17 sessions, for all LTGs   Period  Weeks    Status  New      SLP LONG TERM GOAL #2   Title  Pt will use memory compensation system to recall personal, schedule, or appointment information in 3 therapy sessions.    Time  8    Period  Weeks    Status  New      SLP LONG TERM GOAL #3   Title  Pt will engage functionally in 10 minutes mod complex conversation with compensations for aphasia, x2 visits.    Time  8    Period  Weeks    Status  New      SLP LONG TERM GOAL #4   Title  Pt will maintain adequate volume and vocal quality in 10 minutes mod complex conversation x2 visits.    Time  8    Period  Weeks    Status  New      SLP LONG TERM GOAL #5   Title  Pt will demo auditory comprehension of 10 minutes mod complex conversation by answering questions or commenting appropriately x 2 visits.    Time  8    Period  Weeks    Status   New       Plan - 05/30/19 1100   Clinical Impression Statement  Mr. Davonta Stroot presents with what appear to be moderate cognitive deficits, mild-moderate aphasia (expressive> receptive deficits), and mild dysarthria. Pt is bilingual Education officer, environmental) originally from [place]; he has been in the [place] since 1998. Assessment completed in English, pt's preference. SLP began Cognitive Linguistic Quick Test (aphasia administration), to be completed in next session. Complex auditory comprehension is impaired which does impact comprehension of instructions for some tasks, however SLP noted deficits in tasks without high linguistic demand as well. Sustained and higher level attention impaired, as are recall, working memory, and executive function. Slow processing noted. Vocal intensity is decreased, intermittently hoarse.Prior to his stroke, pt was a cook at [employer], and hopes to return. SLP agrees with pt that cognitive deficits, and to  a lesser extent language deficits, would inhibit pt from performing his job safely and effectively. I recommend skilled ST to maximize cognition, language, and speech for safety, effective communication, and increased independence for return to work.    Speech Therapy Frequency  2x / week   pt to schedule 1x a week due to self-pay status; has applied for Medicaid   Duration  --   8 weeks or 17 visits   Treatment/Interventions  Functional tasks;Patient/family education;Environmental controls;Cognitive reorganization;Multimodal communcation approach;Language facilitation;Compensatory techniques;Internal/external aids;SLP instruction and feedback    Potential to Achieve Goals  Good    Potential Considerations  Financial resources    Consulted and Agree with Plan of Care  Family member/caregiver    Family Member Consulted  daughter       Patient will benefit from skilled therapeutic intervention in order to improve the following deficits and impairments:   1. Cognitive  communication deficit   2. Aphasia   3. Dysarthria and anarthria       Problem List Patient Active Problem List   Diagnosis Date Noted  . HLD (hyperlipidemia) 05/23/2019  . Nicotine abuse 05/23/2019  . CVA (cerebral vascular accident) [facility] 05/21/2019   Rondel BatonMary Beth Amerigo Mcglory, MS, CCC-SLP Speech-Language Pathologist  Arlana LindauMary E Serenna Deroy 06/04/2019, 9:48 AM  [facility] [address] [place], [place], [address] Phone: [phone]   Fax:  [phone]  Name: Brandon Summers MRN: [id number] Date of Birth: 11/01/1971

## 2019-06-04 NOTE — Therapy (Signed)
[facility] [address] [place], [place], [address] Phone: [phone]   Fax:  [phone]  Speech Language Pathology Treatment  Patient Details  Name: Brandon Summers MRN: [id number] Date of Birth: 09/21/1971 Referring Provider (SLP): Brandon GipAndre Douglas, FNP (PCP appt to establish care 06/07/19)   Encounter Date: 06/04/2019  End of Session - 06/04/19 1821    Visit Number  2    Number of Visits  17    Date for SLP Re-Evaluation  07/29/19    Authorization Type  self-pay; has applied for Medicaid    SLP Start Time  1508   8 min late   SLP Stop Time   1553    SLP Time Calculation (min)  45 min    Activity Tolerance  Patient tolerated treatment well       Past Medical History:  Diagnosis Date  . Stroke (cerebrum) (HCC)   . Tobacco abuse     Past Surgical History:  Procedure Laterality Date  . BUBBLE STUDY  05/23/2019   Procedure: BUBBLE STUDY;  Surgeon: Brandon NoseHilty, Brandon C, MD;  [facility];  Service: Cardiovascular;;  . TEE WITHOUT CARDIOVERSION N/A 05/23/2019   Procedure: TRANSESOPHAGEAL ECHOCARDIOGRAM (TEE);  Surgeon: Brandon NoseHilty, Brandon C, MD;  [facility];  Service: Cardiovascular;  Laterality: N/A;    There were no vitals filed for this visit.  Subjective Assessment - 06/04/19 1510    Currently in Pain?  No/denies            ADULT SLP TREATMENT - 06/04/19 1802      General Information   Behavior/Cognition  Alert;Pleasant mood;Cooperative      Pain Assessment   Pain Assessment  No/denies pain      Cognitive-Linquistic Treatment   Treatment focused on  Aphasia;Cognition    Skilled Treatment  SLP completed CLQT administration and aphasia assessment via Western Aphasia Battery-Revised. 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 Complex auditory comprehension impacted by slow processing, working memory deficits. CLQT scores:  Non-linguistic cognition 17/49 (severe) and Linguistic/aphasia 39/56 (moderate). SLP feels in context of pt's cultural/linguistic background, cognitive deficits are more likely moderate and language deficits are more mild-moderate.       Assessment / Recommendations / Plan   Plan  Continue with current plan of care      Progression Toward Goals   Progression toward goals  Progressing toward goals       SLP Education - 06/04/19 1820    Education Details  memory compensation strategies, binder for therapy       SLP Short Term Goals - 06/04/19 1823      SLP SHORT TERM GOAL #1   Title  Pt will demo selective attention to therapy tasks for 10 minutes x2 sessions.    Time  4    Period  Weeks    Status  On-going      SLP SHORT TERM GOAL #2   Title  Pt will establish a memory compensation system and bring to 2 therapy sessions.    Time  4    Period  Weeks    Status  On-going      SLP SHORT TERM GOAL #3   Title  Pt will verbally sequence steps in a functional task (eg recipes) with compensations for aphasia x3 sessions.    Time  4    Period  Weeks    Status  On-going  SLP SHORT TERM GOAL #4   Title  Pt will demo auditory comprehension of mod complex multistep commands >85% accuracy x 3 visits.    Time  4    Period  Weeks    Status  On-going      SLP SHORT TERM GOAL #5   Title  Pt will use compensations for aphasia and dysarthria in 8 minutes simple conversation, x 2 visits with rare min A.    Time  4    Period  Weeks    Status  On-going       SLP Long Term Goals - 06/04/19 1823      SLP LONG TERM GOAL #1   Title  Pt will demo alternating attention in a functional task for 15 minutes x2 sessions.    Time  8   or 17 sessions, for all LTGs   Period  Weeks    Status  On-going      SLP LONG TERM GOAL #2   Title  Pt will use memory compensation system to recall personal, schedule, or appointment information in 3 therapy sessions.    Time  8    Period  Weeks     Status  On-going      SLP LONG TERM GOAL #3   Title  Pt will engage functionally in 10 minutes mod complex conversation with compensations for aphasia, x2 visits.    Time  8    Period  Weeks    Status  On-going      SLP LONG TERM GOAL #4   Title  Pt will maintain adequate volume and vocal quality in 10 minutes mod complex conversation x2 visits.    Time  8    Period  Weeks    Status  On-going      SLP LONG TERM GOAL #5   Title  Pt will demo auditory comprehension of 10 minutes mod complex conversation by answering questions or commenting appropriately x 2 visits.    Time  8    Period  Weeks    Status  On-going       Plan - 06/04/19 1819    Clinical Impression Statement  [name] presents with moderate cognitive deficits, mild-moderate aphasia (expressive> receptive deficits), and mild dysarthria. Pt is bilingual Education officer, environmental) originally from [place]; he has been in the [place] since 1998. Complex auditory comprehension is impaired which does impact comprehension of instructions for some tasks; SLP feels processing contributes significantly. Sustained and higher level attention impaired, as are recall, working memory, and executive function. Vocal intensity is decreased, intermittently hoarse.Prior to his stroke, pt was a cook at [employer], and hopes to return. SLP agrees with pt that cognitive deficits, and to a lesser extent language deficits, would inhibit pt from performing his job safely and effectively. I recommend skilled ST to maximize cognition, language, and speech for safety, effective communication, and increased independence for return to work.    Speech Therapy Frequency  2x / week   pt to schedule 1x a week due to self-pay status; has applied for Medicaid   Duration  --   8 weeks or 17 visits   Treatment/Interventions  Functional tasks;Patient/family education;Environmental controls;Cognitive reorganization;Multimodal communcation approach;Language  facilitation;Compensatory techniques;Internal/external aids;SLP instruction and feedback    Potential to Achieve Goals  Good    Potential Considerations  Financial resources    Consulted and Agree with Plan of Care  Family member/caregiver    Family Member Consulted  daughter  Patient will benefit from skilled therapeutic intervention in order to improve the following deficits and impairments:   1. Cognitive communication deficit   2. Aphasia   3. Dysarthria and anarthria       Problem List Patient Active Problem List   Diagnosis Date Noted  . HLD (hyperlipidemia) 05/23/2019  . Nicotine abuse 05/23/2019  . CVA (cerebral vascular accident) [facility](HCC) 05/21/2019   Rondel BatonMary Beth Zamari Bonsall, MS, CCC-SLP Speech-Language Pathologist  Arlana LindauMary E Javier Gell 06/04/2019, 6:24 PM  [facility] [address] [place], [place], [address] Phone: [phone]   Fax:  [phone]   Name: Brandon Summers MRN: [id number] Date of Birth: 10/21/1971

## 2019-06-07 ENCOUNTER — Encounter: Payer: Self-pay | Admitting: Family Medicine

## 2019-06-07 ENCOUNTER — Ambulatory Visit (INDEPENDENT_AMBULATORY_CARE_PROVIDER_SITE_OTHER): Payer: PRIVATE HEALTH INSURANCE | Admitting: Family Medicine

## 2019-06-07 ENCOUNTER — Other Ambulatory Visit: Payer: Self-pay

## 2019-06-07 VITALS — BP 124/74 | HR 70 | Temp 98.5°F | Resp 16 | Ht 73.0 in | Wt 166.0 lb

## 2019-06-07 DIAGNOSIS — Z720 Tobacco use: Secondary | ICD-10-CM

## 2019-06-07 DIAGNOSIS — I639 Cerebral infarction, unspecified: Secondary | ICD-10-CM | POA: Diagnosis not present

## 2019-06-07 DIAGNOSIS — E785 Hyperlipidemia, unspecified: Secondary | ICD-10-CM | POA: Diagnosis not present

## 2019-06-07 DIAGNOSIS — R942 Abnormal results of pulmonary function studies: Secondary | ICD-10-CM | POA: Diagnosis not present

## 2019-06-07 MED ORDER — ATORVASTATIN CALCIUM 40 MG PO TABS: 40.0000 mg | ORAL_TABLET | Freq: Every day | ORAL | 1 refills | Status: DC

## 2019-06-07 MED ORDER — ASPIRIN EC 81 MG PO TBEC: 81.0000 mg | DELAYED_RELEASE_TABLET | Freq: Every day | ORAL | 3 refills | Status: AC

## 2019-06-07 NOTE — Patient Instructions (Signed)
Health Maintenance, Male Adopting a healthy lifestyle and getting preventive care are important in promoting health and wellness. Ask your health care provider about:  The right schedule for you to have regular tests and exams.  Things you can do on your own to prevent diseases and keep yourself healthy. What should I know about diet, weight, and exercise? Eat a healthy diet   Eat a diet that includes plenty of vegetables, fruits, low-fat dairy products, and lean protein.  Do not eat a lot of foods that are high in solid fats, added sugars, or sodium. Maintain a healthy weight Body mass index (BMI) is a measurement that can be used to identify possible weight problems. It estimates body fat based on height and weight. Your health care provider can help determine your BMI and help you achieve or maintain a healthy weight. Get regular exercise Get regular exercise. This is one of the most important things you can do for your health. Most adults should:  Exercise for at least 150 minutes each week. The exercise should increase your heart rate and make you sweat (moderate-intensity exercise).  Do strengthening exercises at least twice a week. This is in addition to the moderate-intensity exercise.  Spend less time sitting. Even light physical activity can be beneficial. Watch cholesterol and blood lipids Have your blood tested for lipids and cholesterol at 47 years of age, then have this test every 5 years. You may need to have your cholesterol levels checked more often if:  Your lipid or cholesterol levels are high.  You are older than 47 years of age.  You are at high risk for heart disease. What should I know about cancer screening? Many types of cancers can be detected early and may often be prevented. Depending on your health history and family history, you may need to have cancer screening at various ages. This may include screening for:  Colorectal cancer.  Prostate cancer.   Skin cancer.  Lung cancer. What should I know about heart disease, diabetes, and high blood pressure? Blood pressure and heart disease  High blood pressure causes heart disease and increases the risk of stroke. This is more likely to develop in people who have high blood pressure readings, are of African descent, or are overweight.  Talk with your health care provider about your target blood pressure readings.  Have your blood pressure checked: ? Every 3-5 years if you are 79-12 years of age. ? Every year if you are 26 years old or older.  If you are between the ages of 57 and 39 and are a current or former smoker, ask your health care provider if you should have a one-time screening for abdominal aortic aneurysm (AAA). Diabetes Have regular diabetes screenings. This checks your fasting blood sugar level. Have the screening done:  Once every three years after age 15 if you are at a normal weight and have a low risk for diabetes.  More often and at a younger age if you are overweight or have a high risk for diabetes. What should I know about preventing infection? Hepatitis B If you have a higher risk for hepatitis B, you should be screened for this virus. Talk with your health care provider to find out if you are at risk for hepatitis B infection. Hepatitis C Blood testing is recommended for:  Everyone born from [birth year] through 1964.  Anyone with known risk factors for hepatitis C. Sexually transmitted infections (STIs)  You should be screened each year  for STIs, including gonorrhea and chlamydia, if: ? You are sexually active and are younger than 47 years of age. ? You are older than 47 years of age and your health care provider tells you that you are at risk for this type of infection. ? Your sexual activity has changed since you were last screened, and you are at increased risk for chlamydia or gonorrhea. Ask your health care provider if you are at risk.  Ask your health care  provider about whether you are at high risk for HIV. Your health care provider may recommend a prescription medicine to help prevent HIV infection. If you choose to take medicine to prevent HIV, you should first get tested for HIV. You should then be tested every 3 months for as long as you are taking the medicine. Follow these instructions at home: Lifestyle  Do not use any products that contain nicotine or tobacco, such as cigarettes, e-cigarettes, and chewing tobacco. If you need help quitting, ask your health care provider.  Do not use street drugs.  Do not share needles.  Ask your health care provider for help if you need support or information about quitting drugs. Alcohol use  Do not drink alcohol if your health care provider tells you not to drink.  If you drink alcohol: ? Limit how much you have to 0-2 drinks a day. ? Be aware of how much alcohol is in your drink. In the [place] one drink equals one 12 oz bottle of beer (355 mL), one 5 oz glass of wine (148 mL), or one 1 oz glass of hard liquor (44 mL). General instructions  Schedule regular health, dental, and eye exams.  Stay current with your vaccines.  Tell your health care provider if: ? You often feel depressed. ? You have ever been abused or do not feel safe at home. Summary  Adopting a healthy lifestyle and getting preventive care are important in promoting health and wellness.  Follow your health care provider's instructions about healthy diet, exercising, and getting tested or screened for diseases.  Follow your health care provider's instructions on monitoring your cholesterol and blood pressure. This information is not intended to replace advice given to you by your health care provider. Make sure you discuss any questions you have with your health care provider. Document Released: 05/12/2008 Document Revised: 11/07/2018 Document Reviewed: 11/07/2018 Elsevier Patient Education  2020 [employer] Eating Plan  After Stroke A stroke causes damage to the brain cells, which can affect your ability to walk, talk, and even eat. The impact of a stroke is different for everyone, and so is recovery. A good nutrition plan is important for your recovery. It can also lower your risk of another stroke. If you have difficulty chewing and swallowing your food, a dietitian or your stroke care team can help so that you can enjoy eating healthy foods. What are tips for following this plan?  Reading food labels  Choose foods that have less than 300 milligrams (mg) of sodium per serving. Limit your sodium intake to less than 1,500 mg per day.  Avoid foods that have saturated fat and trans fat.  Choose foods that are low in cholesterol. Limit the amount of cholesterol you eat each day to less than 200 mg.  Choose foods that are high in fiber. Eat 20-30 grams (g) of fiber each day.  Avoid foods with added sugar. Check the food label for ingredients such as sugar, corn syrup, honey, fructose, molasses, and cane  juice. Shopping  At the grocery store, buy most of your food from areas near the walls of the store. This includes: ? Fresh fruits and vegetables. ? Dry grains, beans, nuts, and seeds. ? Fresh seafood, poultry, lean meats, and eggs. ? Low-fat dairy products.  Buy whole ingredients instead of prepackaged foods.  Buy fresh, in-season fruits and vegetables from local farmers markets.  Buy frozen fruits and vegetables in resealable bags. Cooking  Prepare foods with very little salt. Use herbs or salt-free spices instead.  Cook with heart-healthy oils, such as olive, avocado, canola, soybean, or sunflower oil.  Avoid frying foods. Bake, grill, or broil foods instead.  Remove visible fat and skin from meat and poultry before eating.  Modify food textures as told by your health care provider. Meal planning  Eat a wide variety of colorful fruits and vegetables. Make sure one-half of your plate is filled  with fruits and vegetables at each meal.  Eat fruits and vegetables that are high in potassium, such as: ? Apples, bananas, oranges, and melon. ? Sweet potatoes, spinach, zucchini, and tomatoes.  Eat fish that contain heart-healthy fats (omega-3 fats) at least twice a week. These include salmon, tuna, mackerel, and sardines.  Eat plant foods that are high in omega-3 fats, such as flaxseeds and walnuts. Add these to cereals, yogurt, or pasta dishes.  Eat several servings of high-fiber foods each day, such as fruits, vegetables, whole grains, and beans.  Do not put salt at the table for meals.  When eating out at restaurants: ? Ask the server about low-salt or salt-free food options. ? Avoid fried foods. Look for menu items that are grilled, steamed, broiled, or roasted. ? Ask if your food can be prepared without butter. ? Ask for condiments, such as salad dressings, gravy, or sauces to be served on the side.  If you have difficulty swallowing: ? Choose foods that are softer and easier to chew and swallow. ? Cut foods into small pieces and chew well before swallowing. ? Thicken liquids as told by your health care provider or dietitian. ? Let your health care provider know if your condition does not improve over time. You may need to work with a speech therapist to re-train the muscles that are used for eating. General recommendations  Involve your family and friends in your recovery, if possible. It may be helpful to have a slower meal time and to plan meals that include foods everyone in the family can eat.  Brush your teeth with fluoride toothpaste twice a day, and floss once a day. Keeping a clean mouth can help you swallow and can also help your appetite.  Drink enough water each day to keep your urine pale yellow. If needed, set reminders or ask your family to help you remember to drink water.  Limit alcohol intake to no more than 1 drink a day for nonpregnant women and 2 drinks a  day for men. One drink equals 12 oz of beer, 5 oz of wine, or 1 oz of hard liquor. Summary  Following this eating plan can help in your stroke recovery and can decrease your risk for another stroke.  Let your health care provider know if you have problems with swallowing. You may need to work with a speech therapist. This information is not intended to replace advice given to you by your health care provider. Make sure you discuss any questions you have with your health care provider. Document Released: 01/22/2018 Document Revised: 03/07/2019  Document Reviewed: 01/22/2018 Elsevier Patient Education  [employer].

## 2019-06-07 NOTE — Progress Notes (Signed)
[facility]  New Patient Encounter Provider: Mike Gipndre Kymani Laursen, FNP    ZOX:[id number]  [id number]  DOB - 09/15/1971  SUBJECTIVE:   Brandon Summers, is a 48 y.o. male who presents to establish care with this clinic.   Current problems/concerns:  Patient presents with ex-wife. Patient hospitalized from 6/23-6/25/2020 due to CVA. Since then, reports compliance with his medications. He was instructed to take plavix x 21 days along with ASA. He reports not having an allergy to ASA.  Patient reports continuing to smoke at least 5 cigs per day.  He has a heart monitor and will follow up with cardiology in August. He was noted to have emphysema while hosptialized and instructed to folllw up for PFTs outpatient. He also needs a referral to follow up with neurology.   2 d ECHO 1. The left ventricle has normal systolic function, with an ejection fraction of 55-60%. The cavity size was normal. Left ventricular diastolic Doppler parameters are consistent with impaired relaxation. No evidence of left ventricular regional wall  motion abnormalities. 2. The right ventricle has normal systolic function. The cavity was normal. There is no increase in right ventricular wall thickness. 3. No evidence of mitral valve stenosis. No mitral regurgitation. 4. The aortic valve is tricuspid. No stenosis of the aortic valve. 5. The aortic root is normal in size and structure.  6. The IVC was normal in size. No complete TR doppler jet so unable to estimate PA systolic pressure.   MRI Brain without contrast 05/21/2019.  IMPRESSION: 1. 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 2. Underlying mild chronic microvascular ischemic disease for age.  No Active Allergies Past Medical History:  Diagnosis Date  . Stroke (cerebrum) (HCC)   . Tobacco abuse    Current Outpatient Medications on File Prior to Visit   Medication Sig Dispense Refill  . clopidogrel (PLAVIX) 75 MG tablet Take 1 tablet (75 mg total) by mouth daily. 21 tablet 0   No current facility-administered medications on file prior to visit.    Family History  Problem Relation Age of Onset  . Hypertension Mother   . Stroke Mother    Social History   Socioeconomic History  . Marital status: Married    Spouse name: Not on file  . Number of children: Not on file  . Years of education: Not on file  . Highest education level: Not on file  Occupational History  . Not on file  Social Needs  . Financial resource strain: Not on file  . Food insecurity    Worry: Not on file    Inability: Not on file  . Transportation needs    Medical: Not on file    Non-medical: Not on file  Tobacco Use  . Smoking status: Current Every Day Smoker    Packs/day: 0.50    Types: Cigarettes  . Smokeless tobacco: Never Used  Substance and Sexual Activity  . Alcohol use: Yes    Comment: occasional  . Drug use: No  . Sexual activity: Not on file  Lifestyle  . Physical activity    Days per week: Not on file    Minutes per session: Not on file  . Stress: Not on file  Relationships  . Social Musicianconnections    Talks on phone: Not on file    Gets together: Not on file    Attends religious service: Not on file    Active  member of club or organization: Not on file    Attends meetings of clubs or organizations: Not on file    Relationship status: Not on file  . Intimate partner violence    Fear of current or ex partner: Not on file    Emotionally abused: Not on file    Physically abused: Not on file    Forced sexual activity: Not on file  Other Topics Concern  . Not on file  Social History Narrative   Marital status: separated; moved from [place] to [place] 1998   Children: 2 children; no grandchildren.   Employment: chef   Tobacco: 1/2ppd   Alcohol: none   Drugs: none   Exercise: none.    Review of Systems  Constitutional: Negative.   HENT:  Negative.   Eyes: Negative.   Respiratory: Negative.   Cardiovascular: Negative.   Gastrointestinal: Negative.   Genitourinary: Negative.   Musculoskeletal: Negative.   Skin: Negative.   Neurological: Negative.   Psychiatric/Behavioral: Negative.      OBJECTIVE:    BP 124/74 (BP Location: Left Arm, Patient Position: Sitting, Cuff Size: Normal)   Pulse 70   Temp 98.5 F (36.9 C) (Oral)   Resp 16   Ht 6\' 1"  (1.854 m)   Wt 166 lb (75.3 kg)   SpO2 100%   BMI 21.90 kg/m   Physical Exam  Constitutional: He is oriented to person, place, and time and well-developed, well-nourished, and in no distress. No distress.  HENT:  Head: Normocephalic and atraumatic.  Eyes: Pupils are equal, round, and reactive to light. Conjunctivae and EOM are normal.  Neck: Normal range of motion. Neck supple.  Cardiovascular: Normal rate, regular rhythm and intact distal pulses. Exam reveals no gallop and no friction rub.  No murmur heard. Pulmonary/Chest: Effort normal and breath sounds normal. No respiratory distress. He has no wheezes.  Abdominal: Soft. Bowel sounds are normal. There is no abdominal tenderness.  Musculoskeletal: Normal range of motion.        General: No tenderness or edema.  Lymphadenopathy:    He has no cervical adenopathy.  Neurological: He is alert and oriented to person, place, and time. Gait normal.  Skin: Skin is warm and dry.  Psychiatric: Mood, memory, affect and judgment normal.  Nursing note and vitals reviewed.    ASSESSMENT/PLAN: 1. Cerebrovascular accident (CVA), unspecified mechanism ([place]) Patient instructed to complete 21 days of plavix and then to continue with ASA alone. Referred to neurology for further evaluation.  - Ambulatory referral to Neurology  2. Hyperlipidemia, unspecified hyperlipidemia type - atorvastatin (LIPITOR) 40 MG tablet; Take 1 tablet (40 mg total) by mouth daily at 6 PM.  Dispense: 90 tablet; Refill: 1  3. Nicotine abuse Smoking  cessation instruction/counseling given:  counseled patient on the dangers of tobacco use, advised patient to stop smoking, and reviewed strategies to maximize success - Ambulatory referral to Pulmonology - Referred to smoking cessation program.  4. Abnormal pulmonary function Referral to pulmonology for PFTs - Ambulatory referral to Pulmonology   Return in about 3 months (around 09/07/2019) for Stroke.  The patient was given clear instructions to go to ER or return to medical center if symptoms don't improve, worsen or new problems develop. The patient verbalized understanding. The patient was told to call to get lab results if they haven't heard anything in the next week.     This note has been created with Surveyor, quantity. Any transcriptional errors are unintentional.  Ms. Doug Sou. Nathaneil Canary, FNP-BC [facility] [address] [place], [place] [address] [phone]

## 2019-06-07 NOTE — Patient Outreach (Signed)
[facility]) Care Management  06/07/2019  Makoto Sellitto 1971/06/22 [id number]  EMMI: stroke red alert Referral date: 06/07/19 Referral reason: scheduled follow up appointment Insurance:  Non listed Day # 13  Telephone call to patient regarding EMMI stroke red alert. HIPAA verified with patient.  RNCM introduced herself and explained reason for call.  Patient states he has not seen a doctor since discharge from the hospital.  Patient reports he has only gone to rehab.  RNCM reviewed patients after visit summary with him. Advised patient he is scheduled to see a primary MD today at 1:00pm.  Patient states he was not aware.  RNCM provided patient name, address and contact phone number for doctors office.  Discussed importance of patient seeing doctor today for follow up. Advised patient to take his medications with him to appointment.  Patient states he has had some issues over the last couple of days with his eyes swelling, one then the other. Patient reports today his right eye is a little swollen. Patient denies any eye pain or drainage.  RNCM advised patient to report eye symptoms to the doctor.  Patient verbalized understanding. Patient denied any further needs or concerns at this time.  RNCM advised patient to notify MD of any changes in condition prior to scheduled appointment. RNCM verified patient aware of 911 services for urgent/ emergent needs.   PLAN: RNCM will close case due to patient being assessed and having no further needs.   Quinn Plowman RN,BSN,CCM [facility] Telephonic  [phone]

## 2019-06-11 ENCOUNTER — Ambulatory Visit: Payer: PRIVATE HEALTH INSURANCE | Admitting: Speech Pathology

## 2019-06-11 ENCOUNTER — Other Ambulatory Visit: Payer: Self-pay

## 2019-06-11 DIAGNOSIS — R4701 Aphasia: Secondary | ICD-10-CM

## 2019-06-11 DIAGNOSIS — I69318 Other symptoms and signs involving cognitive functions following cerebral infarction: Secondary | ICD-10-CM | POA: Diagnosis not present

## 2019-06-11 DIAGNOSIS — R41841 Cognitive communication deficit: Secondary | ICD-10-CM

## 2019-06-11 NOTE — Patient Instructions (Signed)
Try using your phone calendar app to set appointment dates and reminders for yourself. Please add your appointments for August.

## 2019-06-11 NOTE — Therapy (Signed)
[facility] [address] [place], [place], [address] Phone: [phone]   Fax:  [phone]  Speech Language Pathology Treatment  Patient Details  Name: Brandon Summers MRN: [id number] Date of Birth: 03/05/1971 Referring Provider (SLP): Mike GipAndre Douglas, FNP (PCP appt to establish care 06/07/19)   Encounter Date: 06/11/2019  End of Session - 06/11/19 1421    Visit Number  3    Number of Visits  17    Date for SLP Re-Evaluation  07/29/19    Authorization Type  self-pay; has applied for Medicaid    SLP Start Time  1102    SLP Stop Time   1148    SLP Time Calculation (min)  46 min    Activity Tolerance  Patient tolerated treatment well       Past Medical History:  Diagnosis Date  . Stroke (cerebrum) (HCC)   . Tobacco abuse     Past Surgical History:  Procedure Laterality Date  . BUBBLE STUDY  05/23/2019   Procedure: BUBBLE STUDY;  Surgeon: Chrystie NoseHilty, Kenneth C, MD;  [facility];  Service: Cardiovascular;;  . TEE WITHOUT CARDIOVERSION N/A 05/23/2019   Procedure: TRANSESOPHAGEAL ECHOCARDIOGRAM (TEE);  Surgeon: Chrystie NoseHilty, Kenneth C, MD;  [facility];  Service: Cardiovascular;  Laterality: N/A;    There were no vitals filed for this visit.  Subjective Assessment - 06/11/19 1407    Subjective  "When do I go back?"    Currently in Pain?  No/denies            ADULT SLP TREATMENT - 06/11/19 1407      General Information   Behavior/Cognition  Alert;Pleasant mood;Cooperative      Pain Assessment   Pain Assessment  No/denies pain      Cognitive-Linquistic Treatment   Treatment focused on  Aphasia;Cognition    Skilled Treatment  Pt asked SLP (see "s") re: 3 month follow-up with MD. SLP told pt appointment time/date. SLP asked pt how he would remember this and he shrugged, pointing to his head. SLP educated re: memory compensation strategies, and pt pulled handout re: this given last time from his pocket.  Pt has a wall calendar at home. He agreed that he would have difficulty recalling appointment dates/times now, since his stroke. As pt seemed reluctant to use/carry a planner, SLP demo'd using his smartphone as memory compensation aid. Initially, usual mod A required to enter appointment type, time, date, and reminder alert appropriately in calendar. With repetition in adding remaining ST appointments for July, pt required fewer cues, (occasional min-mod A). Pt maintained adequate selective attention (door open with min noise) during this task for 12 minutes. Pt to add appointments for August at home. 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 Pt noted to use gesture spontaneously as well as word substitution ("crunch" vs crack eggs) during this task. SLP educated re: compensations additional compensations for anomia as well as tasks to work on writing/expressive language at home. SLP provided copy of handout re: pt's diet given by MD, home task to generate a shopping list and ideas/recipes for how he might prepare healthy foods.       Assessment / Recommendations / Plan   Plan  Continue with current plan of care      Progression Toward Goals   Progression toward goals  Progressing toward goals  SLP Short Term Goals - 06/11/19 1422      SLP SHORT TERM GOAL #1   Title  Pt will demo selective attention to therapy tasks for 10 minutes x2 sessions.    Baseline  06/11/19    Time  3    Period  Weeks    Status  On-going      SLP SHORT TERM GOAL #2   Title  Pt will establish a memory compensation system and bring to 2 therapy sessions.    Time  3    Period  Weeks    Status  On-going      SLP SHORT TERM GOAL #3   Title  Pt will verbally sequence steps in a functional task (eg recipes) with compensations for aphasia x3 sessions.    Time  3    Period  Weeks     Status  On-going      SLP SHORT TERM GOAL #4   Title  Pt will demo auditory comprehension of mod complex multistep commands >85% accuracy x 3 visits.    Time  3    Period  Weeks    Status  On-going      SLP SHORT TERM GOAL #5   Title  Pt will use compensations for aphasia and dysarthria in 8 minutes simple conversation, x 2 visits with rare min A.    Time  3    Period  Weeks    Status  On-going       SLP Long Term Goals - 06/11/19 1422      SLP LONG TERM GOAL #1   Title  Pt will demo alternating attention in a functional task for 15 minutes x2 sessions.    Time  7   or 17 sessions, for all LTGs   Period  Weeks    Status  On-going      SLP LONG TERM GOAL #2   Title  Pt will use memory compensation system to recall personal, schedule, or appointment information in 3 therapy sessions.    Time  7    Period  Weeks    Status  On-going      SLP LONG TERM GOAL #3   Title  Pt will engage functionally in 10 minutes mod complex conversation with compensations for aphasia, x2 visits.    Time  7    Period  Weeks    Status  On-going      SLP LONG TERM GOAL #4   Title  Pt will maintain adequate volume and vocal quality in 10 minutes mod complex conversation x2 visits.    Time  7    Period  Weeks    Status  On-going      SLP LONG TERM GOAL #5   Title  Pt will demo auditory comprehension of 10 minutes mod complex conversation by answering questions or commenting appropriately x 2 visits.    Time  7    Period  Weeks    Status  On-going         Patient will benefit from skilled therapeutic intervention in order to improve the following deficits and impairments:   1. Cognitive communication deficit   2. Aphasia       Problem List Patient Active Problem List   Diagnosis Date Noted  . HLD (hyperlipidemia) 05/23/2019  . Nicotine abuse 05/23/2019  . CVA (cerebral vascular accident) [facility]) 05/21/2019   Deneise Lever, [place], [place] Speech-Language Pathologist  Aliene Altes 06/11/2019, 2:23  PM  [facility] [address] [place], [place], [address] Phone: [phone]   Fax:  [phone]   Name: Brandon Summers MRN: [id number] Date of Birth: 10/15/1971

## 2019-06-18 ENCOUNTER — Encounter: Payer: Self-pay | Admitting: Speech Pathology

## 2019-06-18 ENCOUNTER — Other Ambulatory Visit: Payer: Self-pay

## 2019-06-18 ENCOUNTER — Ambulatory Visit: Payer: PRIVATE HEALTH INSURANCE | Admitting: Speech Pathology

## 2019-06-18 DIAGNOSIS — R41841 Cognitive communication deficit: Secondary | ICD-10-CM

## 2019-06-18 DIAGNOSIS — I69318 Other symptoms and signs involving cognitive functions following cerebral infarction: Secondary | ICD-10-CM | POA: Diagnosis not present

## 2019-06-18 DIAGNOSIS — R4701 Aphasia: Secondary | ICD-10-CM

## 2019-06-18 NOTE — Therapy (Signed)
[facility] [address] [place], [place], [address] Phone: [phone]   Fax:  [phone]  Speech Language Pathology Treatment  Patient Details  Name: Brandon Summers MRN: [id number] Date of Birth: 22-Apr-1971 Referring Provider (SLP): Lanae Boast, FNP (PCP appt to establish care 06/07/19)   Encounter Date: 06/18/2019  End of Session - 06/18/19 1511    Number of Visits  17    Date for SLP Re-Evaluation  07/29/19    Authorization Type  self-pay; has applied for Medicaid    SLP Start Time  1302    SLP Stop Time   1345    SLP Time Calculation (min)  43 min    Activity Tolerance  Patient tolerated treatment well       Past Medical History:  Diagnosis Date  . Stroke (cerebrum) ([place])   . Tobacco abuse     Past Surgical History:  Procedure Laterality Date  . BUBBLE STUDY  05/23/2019   Procedure: BUBBLE STUDY;  Surgeon: Pixie Casino, MD;  Location: [place];  Service: Cardiovascular;;  . TEE WITHOUT CARDIOVERSION N/A 05/23/2019   Procedure: TRANSESOPHAGEAL ECHOCARDIOGRAM (TEE);  Surgeon: Pixie Casino, MD;  Location: [facility] ENDOSCOPY;  Service: Cardiovascular;  Laterality: N/A;    There were no vitals filed for this visit.  Subjective Assessment - 06/18/19 1357    Subjective  "It is my daughter"    Patient is accompained by:  Family member   daugher (47 yo)           ADULT SLP TREATMENT - 06/18/19 1317      General Information   Behavior/Cognition  Alert;Pleasant mood;Cooperative      Cognitive-Linquistic Treatment   Treatment focused on  Cognition    Skilled Treatment  Pt had not put his August appointments in his phone. We reviewed how to do this, pt required extended time and mod A from daughter to add 1st week of August in his phone. Had the office print him a new calendar with instructions to add the rest of the August appointments at home. Pt had questions re: meds - asked pt how he could get in  touch with his doctor to ask the questions. Had pt set up a contact in his phone with his physicians/FNP's number in his phone. Pt also requested the number to our office, which he added a contact under "Therapists" with our number with min A and extneded time for processing. Pt and daughter were asking about disability. I had pt pull up infomration on his phone re: how to apply for disability. We printed the information and pt added his zip code to find closest [facility] office with rare min A. Pt required usual cues for error awareness when he entered wrong number or spelled names wrong making contacts. He reqiured copy cues to enter his FNP's name, and verbal cues to correct his errors doing this. Pt verbalized "my talking is bad, memory and "no focus" when asked what had changed since his stroke. He also added overall physical weakness and vision.       Assessment / Recommendations / Plan   Plan  Continue with current plan of care      Progression Toward Goals   Progression toward goals  Progressing toward goals       SLP Education - 06/18/19 1505    Education Details  put august appts in your phone; call doc with questions re: meds    Person(s) Educated  Patient;Child(ren)  Methods  Explanation;Verbal cues;Handout    Comprehension  Verbalized understanding;Need further instruction;Verbal cues required       SLP Short Term Goals - 06/18/19 1507      SLP SHORT TERM GOAL #1   Title  Pt will demo selective attention to therapy tasks for 10 minutes x2 sessions.    Baseline  06/11/19; 06/18/19    Time  2    Period  Weeks    Status  Achieved      SLP SHORT TERM GOAL #2   Title  Pt will establish a memory compensation system and bring to 2 therapy sessions.    Time  32    Period  Weeks    Status  On-going      SLP SHORT TERM GOAL #3   Title  Pt will verbally sequence steps in a functional task (eg recipes) with compensations for aphasia x3 sessions.    Time  2    Period  Weeks    Status   On-going      SLP SHORT TERM GOAL #4   Title  Pt will demo auditory comprehension of mod complex multistep commands >85% accuracy x 3 visits.    Time  3    Period  Weeks    Status  On-going      SLP SHORT TERM GOAL #5   Title  Pt will use compensations for aphasia and dysarthria in 8 minutes simple conversation, x 2 visits with rare min A.    Time  2    Period  Weeks    Status  On-going       SLP Long Term Goals - 06/18/19 1508      SLP LONG TERM GOAL #1   Title  Pt will demo alternating attention in a functional task for 15 minutes x2 sessions.    Time  6   or 17 sessions, for all LTGs   Period  Weeks    Status  On-going      SLP LONG TERM GOAL #2   Title  Pt will use memory compensation system to recall personal, schedule, or appointment information in 3 therapy sessions.    Time  6    Period  Weeks    Status  On-going      SLP LONG TERM GOAL #3   Title  Pt will engage functionally in 10 minutes mod complex conversation with compensations for aphasia, x2 visits.    Time  6    Period  Weeks    Status  On-going      SLP LONG TERM GOAL #4   Title  Pt will maintain adequate volume and vocal quality in 10 minutes mod complex conversation x2 visits.    Time  6    Period  Weeks    Status  On-going      SLP LONG TERM GOAL #5   Title  Pt will demo auditory comprehension of 10 minutes mod complex conversation by answering questions or commenting appropriately x 2 visits.    Time  6    Period  Weeks    Status  On-going       Plan - 06/18/19 1506    Clinical Impression Statement  [name] presents with moderate cognitive deficits, mild-moderate aphasia (expressive> receptive deficits), and mild dysarthria. Pt is bilingual Engineer, petroleum(French/English) originally from [place]; he has been in the [place] since 1998. Complex auditory comprehension is impaired which does impact comprehension of instructions for some tasks; SLP  feels processing contributes significantly. Sustained and higher  level attention impaired, as are recall, working memory, and executive function. Vocal intensity is decreased, intermittently hoarse.Prior to his stroke, pt was a cook at [employer], and hopes to return. SLP agrees with pt that cognitive deficits, and to a lesser extent language deficits, would inhibit pt from performing his job safely and effectively. I recommend skilled ST to maximize cognition, language, and speech for safety, effective communication, and increased independence for return to work.    Speech Therapy Frequency  2x / week    Duration  --   8 weeks or 17 visits   Treatment/Interventions  Functional tasks;Patient/family education;Environmental controls;Cognitive reorganization;Multimodal communcation approach;Language facilitation;Compensatory techniques;Internal/external aids;SLP instruction and feedback    Potential to Achieve Goals  Good    Potential Considerations  Financial resources       Patient will benefit from skilled therapeutic intervention in order to improve the following deficits and impairments:   1. Aphasia   2. Cognitive communication deficit       Problem List Patient Active Problem List   Diagnosis Date Noted  . HLD (hyperlipidemia) 05/23/2019  . Nicotine abuse 05/23/2019  . CVA (cerebral vascular accident) (HCC) 05/21/2019    Lovvorn, Radene JourneyLaura Ann MS, CCC-SLP 06/18/2019, 3:12 PM  [facility] [address] [place], [place], [address] Phone: [phone]   Fax:  [phone]   Name: Brandon Summers MRN: [id number] Date of Birth: 11/03/1971

## 2019-06-18 NOTE — Patient Instructions (Addendum)
   Call [facility] [phone] and ask if you should get a refill of Atorvastatin  Ask if you should take aspirin and plavix  Use the  pill organizer   In order to get your medical records from the hospital you will need to call [place] and request copies of your doctor's notes, therapists' notes and all of the tests and x-rays you had  [phone]

## 2019-06-25 ENCOUNTER — Encounter: Payer: Self-pay | Admitting: Rehabilitative and Restorative Service Providers"

## 2019-06-25 ENCOUNTER — Ambulatory Visit: Payer: PRIVATE HEALTH INSURANCE | Admitting: Rehabilitative and Restorative Service Providers"

## 2019-06-25 ENCOUNTER — Other Ambulatory Visit: Payer: Self-pay

## 2019-06-25 ENCOUNTER — Ambulatory Visit: Payer: PRIVATE HEALTH INSURANCE | Admitting: Speech Pathology

## 2019-06-25 DIAGNOSIS — R41841 Cognitive communication deficit: Secondary | ICD-10-CM

## 2019-06-25 DIAGNOSIS — R2689 Other abnormalities of gait and mobility: Secondary | ICD-10-CM

## 2019-06-25 DIAGNOSIS — R471 Dysarthria and anarthria: Secondary | ICD-10-CM

## 2019-06-25 DIAGNOSIS — I69318 Other symptoms and signs involving cognitive functions following cerebral infarction: Secondary | ICD-10-CM | POA: Diagnosis not present

## 2019-06-25 DIAGNOSIS — R4701 Aphasia: Secondary | ICD-10-CM

## 2019-06-25 DIAGNOSIS — R29818 Other symptoms and signs involving the nervous system: Secondary | ICD-10-CM

## 2019-06-25 NOTE — Therapy (Signed)
[facility] [address] [place], [place], [address] Phone: [phone]   Fax:  [phone]  Speech Language Pathology Treatment  Patient Details  Name: Brandon Summers MRN: [id number] Date of Birth: 02/27/1971 Referring Provider (SLP): Mike GipAndre Douglas, FNP (PCP appt to establish care 06/07/19)   Encounter Date: 06/25/2019  End of Session - 06/25/19 1611    Visit Number  5    Number of Visits  17    Date for SLP Re-Evaluation  07/29/19    Authorization Type  self-pay; has applied for Medicaid    SLP Start Time  1502    SLP Stop Time   1547    SLP Time Calculation (min)  45 min       Past Medical History:  Diagnosis Date  . Stroke (cerebrum) (HCC)   . Tobacco abuse     Past Surgical History:  Procedure Laterality Date  . BUBBLE STUDY  05/23/2019   Procedure: BUBBLE STUDY;  Surgeon: Chrystie NoseHilty, Kenneth C, MD;  [facility];  Service: Cardiovascular;;  . TEE WITHOUT CARDIOVERSION N/A 05/23/2019   Procedure: TRANSESOPHAGEAL ECHOCARDIOGRAM (TEE);  Surgeon: Chrystie NoseHilty, Kenneth C, MD;  [facility];  Service: Cardiovascular;  Laterality: N/A;    There were no vitals filed for this visit.  Subjective Assessment - 06/25/19 1511    Subjective  "Oh, the medication. The big one."    Currently in Pain?  No/denies            ADULT SLP TREATMENT - 06/25/19 1602      General Information   Behavior/Cognition  Alert;Pleasant mood;Cooperative      Cognitive-Linquistic Treatment   Treatment focused on  Cognition    Skilled Treatment  Pt reports he contacted his PCP and clarified instructions re: medications. Daughter set up with med box and pt reports taking as scheduled, no missed doses. SLP inquired about progress with disability paperwork; patient scratched his head and appeared overwhelmed. We discussed creating a system to help him organize his financial obligations and schedule. Pt agreed a binder would be  helpful. Pt required occasional min-mod A when listing financial obligations; he began filling in a chart for August and is to complete with due dates/amount due at home. Throughout session, low vocal intensity negatively impacted pt's intelligibility, with frequent cues for increased intensity from SLP required.       Assessment / Recommendations / Plan   Plan  Continue with current plan of care      Progression Toward Goals   Progression toward goals  Progressing toward goals         SLP Short Term Goals - 06/25/19 1607      SLP SHORT TERM GOAL #1   Title  Pt will demo selective attention to therapy tasks for 10 minutes x2 sessions.    Baseline  06/11/19; 06/18/19    Time  1    Period  Weeks    Status  Achieved      SLP SHORT TERM GOAL #2   Title  Pt will establish a memory compensation system and bring to 2 therapy sessions.    Time  1    Period  Weeks    Status  On-going      SLP SHORT TERM GOAL #3   Title  Pt will verbally sequence steps in a functional task (eg recipes) with compensations for aphasia x3 sessions.    Time  1    Period  Weeks  Status  Deferred      SLP SHORT TERM GOAL #4   Title  Pt will demo auditory comprehension of mod complex multistep commands >85% accuracy x 3 visits.    Time  1    Period  Weeks    Status  On-going      SLP SHORT TERM GOAL #5   Title  Pt will use compensations for aphasia and dysarthria in 8 minutes simple conversation, x 2 visits with rare min A.    Time  1    Period  Weeks    Status  On-going       SLP Long Term Goals - 06/25/19 1609      SLP LONG TERM GOAL #1   Title  Pt will demo alternating attention in a functional task for 15 minutes x2 sessions.    Time  5   or 17 sessions, for all LTGs   Period  Weeks    Status  On-going      SLP LONG TERM GOAL #2   Title  Pt will use memory compensation system to recall personal, schedule, or appointment information in 3 therapy sessions.    Time  5    Period  Weeks     Status  On-going      SLP LONG TERM GOAL #3   Title  Pt will engage functionally in 10 minutes mod complex conversation with compensations for aphasia, x2 visits.    Time  5    Period  Weeks    Status  On-going      SLP LONG TERM GOAL #4   Title  Pt will maintain adequate volume and vocal quality in 10 minutes mod complex conversation x2 visits.    Time  5    Period  Weeks    Status  On-going      SLP LONG TERM GOAL #5   Title  Pt will demo auditory comprehension of 10 minutes mod complex conversation by answering questions or commenting appropriately x 2 visits.    Time  5    Period  Weeks    Status  On-going       Plan - 06/25/19 1612    Clinical Impression Statement  Brandon Summers presents with moderate cognitive deficits, mild-moderate aphasia (expressive> receptive deficits), and mild dysarthria. Pt is bilingual Education officer, environmental) originally from [place]; he has been in the [place] since 1998. Impaired recall and organization, as well as slow processing noted in functional cognitive tasks today. As pt has had difficulty programming events in his phone, SLP suggested pt obtain a binder or memory notebook to assist with organization. Prior to his stroke, pt was a cook at [employer], and hopes to return. SLP agrees with pt that cognitive deficits, and to a lesser extent language deficits, would inhibit pt from performing his job safely and effectively. I recommend skilled ST to maximize cognition, language, and speech for safety, effective communication, and increased independence for return to work.    Speech Therapy Frequency  2x / week    Duration  --   8 weeks or 17 visits   Treatment/Interventions  Functional tasks;Patient/family education;Environmental controls;Cognitive reorganization;Multimodal communcation approach;Language facilitation;Compensatory techniques;Internal/external aids;SLP instruction and feedback    Potential to Achieve Goals  Good    Potential Considerations   Financial resources       Patient will benefit from skilled therapeutic intervention in order to improve the following deficits and impairments:   1. Cognitive communication deficit  2. Aphasia   3. Dysarthria and anarthria       Problem List Patient Active Problem List   Diagnosis Date Noted  . HLD (hyperlipidemia) 05/23/2019  . Nicotine abuse 05/23/2019  . CVA (cerebral vascular accident) [facility](HCC) 05/21/2019   Rondel BatonMary Beth Arne Schlender, MS, CCC-SLP Speech-Language Pathologist  Arlana LindauMary E Jamyria Ozanich 06/25/2019, 4:16 PM  [place] [facility] [address] [place], [place], [address] Phone: [phone]   Fax:  [phone]   Name: Brandon Summers MRN: [id number] Date of Birth: 08/31/1971

## 2019-06-25 NOTE — Therapy (Signed)
[facility] [address] [place], [place], [address] Phone: [phone]   Fax:  [phone]  Physical Therapy Treatment  Patient Details  Name: Brandon Summers MRN: [id number] Date of Birth: 01/18/1971 Referring Provider (PT): Mike GipAndre Douglas, NP   *Referred by hospitalist Calvert CantorSaima Rizwan, MD, however will f/u with Mike GipAndre Douglas.  CLINIC OPERATION CHANGES: Outpatient Neuro Rehab is open at lower capacity following universal masking, social distancing, and patient screening.  The patient's COVID risk of complications score is 1.  Encounter Date: 06/25/2019  PT End of Session - 06/25/19 1138    Visit Number  1    Number of Visits  4    Date for PT Re-Evaluation  08/09/19    Authorization Type  has applied for medicaid/ self pay at this time.    PT Start Time  0805    PT Stop Time  0845    PT Time Calculation (min)  40 min       Past Medical History:  Diagnosis Date  . Stroke (cerebrum) (HCC)   . Tobacco abuse     Past Surgical History:  Procedure Laterality Date  . BUBBLE STUDY  05/23/2019   Procedure: BUBBLE STUDY;  Surgeon: Chrystie NoseHilty, Kenneth C, MD;  [facility];  Service: Cardiovascular;;  . TEE WITHOUT CARDIOVERSION N/A 05/23/2019   Procedure: TRANSESOPHAGEAL ECHOCARDIOGRAM (TEE);  Surgeon: Chrystie NoseHilty, Kenneth C, MD;  [facility];  Service: Cardiovascular;  Laterality: N/A;    There were no vitals filed for this visit.  Subjective Assessment - 06/25/19 0808    Subjective  The patient is s/p CVA 05/21/2019 and was hospitalized until 05/23/2019.  He reports his cc:  difficulty swallowing, forgetful, blurriness with vision at times,  He notes weakness when he walks.    Pertinent History  hyperlipidemia, nicotine abuse.    Patient Stated Goals  His main goal involves talking better.  He also notes getting stronger.    Currently in Pain?  No/denies         [facility] - 06/25/19 [id number]       Assessment   Medical Diagnosis  CVA    Referring Provider (PT)  Mike GipAndre Douglas, NP   *Referred by hospitalist Calvert CantorSaima Rizwan, MD, however will f/u with Mike GipAndre Douglas.    Onset Date/Surgical Date  05/21/19    Hand Dominance  Right    Prior Therapy  acute care evaluations      Precautions   Precautions  Fall      Restrictions   Weight Bearing Restrictions  No      Balance Screen   Has the patient fallen in the past 6 months  No    Has the patient had a decrease in activity level because of a fear of falling?   Yes   notes low energy   Is the patient reluctant to leave their home because of a fear of falling?   No      Home Environment   Living Environment  Private residence    Living Arrangements  Children   daughter, whom is 47 yo   Type of Home  Apartment    Home Access  Stairs to enter    Home Layout  Two level   he notes condominium   Home Equipment  None      Prior Function   Level of Independence  Independent    Vocation  Part time employment   held 2 jobs (worked 16 hours/day)  Vocation Requirements  On your feet       Sensation   Light Touch  Appears Intact      ROM / Strength   AROM / PROM / Strength  AROM;Strength      AROM   Overall AROM   Within functional limits for tasks performed      Strength   Overall Strength  Within functional limits for tasks performed    Overall Strength Comments  Patient has 5/5 throughout R LE.  L LE is 4/5 for hip flexion, 5/5 for knee flexion/extension and 5/5 for ankle DF.      Ambulation/Gait   Ambulation/Gait  Yes    Ambulation/Gait Assistance  7: Independent    Ambulation Distance (Feet)  1000 Feet    Assistive device  None    Gait Pattern  --   slowed speed, occasional limping when fatigued   Ambulation Surface  Level;Indoor    Gait velocity  2.43 ft/sec    Stairs  Yes    Stairs Assistance  6: Modified independent (Device/Increase time)    Stair Management Technique  One rail Right;Alternating pattern   slowed  speed   Number of Stairs  4      6 minute walk test results    Aerobic Endurance Distance Walked  1005    Endurance additional comments  Patient reports fatigue.  He is able to tolerate 6 minutes nonstop without resting.      Standardized Balance Assessment   Standardized Balance Assessment  Berg Balance Test      Berg Balance Test   Sit to Stand  Able to stand without using hands and stabilize independently    Standing Unsupported  Able to stand safely 2 minutes    Sitting with Back Unsupported but Feet Supported on Floor or Stool  Able to sit safely and securely 2 minutes    Stand to Sit  Sits safely with minimal use of hands    Transfers  Able to transfer safely, minor use of hands    Standing Unsupported with Eyes Closed  Able to stand 10 seconds safely    Standing Unsupported with Feet Together  Able to place feet together independently and stand 1 minute safely    From Standing, Reach Forward with Outstretched Arm  Can reach confidently >25 cm (10")    From Standing Position, Pick up Object from Floor  Able to pick up shoe safely and easily    From Standing Position, Turn to Look Behind Over each Shoulder  Looks behind from both sides and weight shifts well    Turn 360 Degrees  Able to turn 360 degrees safely one side only in 4 seconds or less    Standing Unsupported, Alternately Place Feet on Step/Stool  Able to stand independently and safely and complete 8 steps in 20 seconds    Standing Unsupported, One Foot in Front  Able to place foot tandem independently and hold 30 seconds    Standing on One Leg  Able to lift leg independently and hold > 10 seconds    Total Score  55    Berg comment:  55/56      High Level Balance   High Level Balance Activites  Backward walking;Turns    High Level Balance Comments  Patient independet at slower pace.                           PT Education - 06/25/19  1143    Education Details  home walking program    Person(s)  Educated  Patient    Methods  Explanation    Comprehension  Verbalized understanding          PT Long Term Goals - 06/25/19 0850      PT LONG TERM GOAL #1   Title  The patient will be indep with HEP for LE strengthening, walking program for endurance.    Baseline  No current HEP    Time  4    Period  Weeks    Target Date  08/09/19      PT LONG TERM GOAL #2   Title  Increase 6 minute walk test by 120 feet in 6 minutes to demonstrate improving community mobility.    Baseline  1005 ft in 6 minutes    Time  4    Period  Weeks    Target Date  08/09/19            Plan - 06/25/19 1139    Clinical Impression Statement  The patient is a 48 yo male presenting to outpatient physical therapy s/p CVA on 05/21/2019 with hospitalization until 05/23/2019.  He presents with impairments in muscle strength (L hip flexor weakness), gait abnormalities with fatigue with gait, slowed speed of movement and decreased endurance.    Personal Factors and Comorbidities  Profession   Works on his feet all day   Examination-Activity Limitations  Locomotion Level    Examination-Participation Restrictions  Cleaning;Community Activity    Stability/Clinical Decision Making  Stable/Uncomplicated    Clinical Decision Making  Low    Rehab Potential  Good    PT Frequency  1x / week    PT Duration  4 weeks    PT Treatment/Interventions  ADLs/Self Care Home Management;Neuromuscular re-education;Patient/family education;Gait training;Stair training;Functional mobility training;Therapeutic exercise;Therapeutic activities;Balance training    PT Next Visit Plan  Check home walking program, add high level balance and progress home mobility (scheduled one more visit in 3 weeks-- anticipate d/c at that time).    Consulted and Agree with Plan of Care  Patient       Patient will benefit from skilled therapeutic intervention in order to improve the following deficits and impairments:  Abnormal gait, Decreased  strength  Visit Diagnosis: 1. Other symptoms and signs involving the nervous system   2. Other abnormalities of gait and mobility        Problem List Patient Active Problem List   Diagnosis Date Noted  . HLD (hyperlipidemia) 05/23/2019  . Nicotine abuse 05/23/2019  . CVA (cerebral vascular accident) (HCC) 05/21/2019    Deona Novitski, PT 06/25/2019, 11:44 AM  [facility] [address] [place], [place], [address] Phone: [phone]   Fax:  [phone]  Name: Brandon Miumadou Schellhase MRN: [id number] Date of Birth: 03/19/1971

## 2019-06-25 NOTE — Patient Instructions (Addendum)
Get a 3 ring binder with dividers to organize your schedule, finances, and therapy materials.  Make sections for a calendar, finances, PT, OT and Speech Therapy. Find the paper Mickel Baas gave you last time and put it in your binder.   Bring your binder when you come to therapy.

## 2019-06-25 NOTE — Patient Instructions (Signed)
WALKING  Walking is a great form of exercise to increase your strength, endurance and overall fitness.  A walking program can help you start slowly and gradually build endurance as you go.  Everyone's ability is different, so each person's starting point will be different.  You do not have to follow them exactly.  The are just samples. You should simply find out what's right for you and stick to that program.   In the beginning, you'll start off walking 2-3 times a day for short distances.  As you get stronger, you'll be walking further at just 1-2 times per day.   You Can Walk For A Certain Length Of Time Each Day    Walk 6-8 minutes 2 times per day (depending on the weather-- choose early in the morning or later in the evening to avoid the heat of the day).  Increase 3-4 minutes every week.  Work up to 25-30 minutes (1-2 times per day).   Example:   Day 1-2 6-8 minutes 2 times/day   Day 7-8 10-12 minutes 2 times/day   Day 13-14 14-16 minutes 1-2 times per day

## 2019-06-26 ENCOUNTER — Ambulatory Visit: Payer: PRIVATE HEALTH INSURANCE | Admitting: Occupational Therapy

## 2019-06-26 ENCOUNTER — Encounter: Payer: Self-pay | Admitting: Occupational Therapy

## 2019-06-26 DIAGNOSIS — R41840 Attention and concentration deficit: Secondary | ICD-10-CM

## 2019-06-26 DIAGNOSIS — I69318 Other symptoms and signs involving cognitive functions following cerebral infarction: Secondary | ICD-10-CM | POA: Diagnosis not present

## 2019-06-26 DIAGNOSIS — R29818 Other symptoms and signs involving the nervous system: Secondary | ICD-10-CM

## 2019-06-26 NOTE — Therapy (Signed)
[facility] [address] [place], [place], [address] Phone: [phone]   Fax:  [phone]  Occupational Therapy Treatment  Patient Details  Name: Brandon Summers MRN: [id number] Date of Birth: 1971-03-06 Referring Provider (OT): Lanae Boast, FNP  (PCP appt to establish care 06/07/19)   Encounter Date: 06/26/2019  OT End of Session - 06/26/19 1730    Visit Number  2    Number of Visits  9    Date for OT Re-Evaluation  07/29/19    Authorization Type  sel pay    OT Start Time  1703    OT Stop Time  1745    OT Time Calculation (min)  42 min    Activity Tolerance  Patient tolerated treatment well    Behavior During Therapy  [facility] for tasks assessed/performed       Past Medical History:  Diagnosis Date  . Stroke (cerebrum) ([place])   . Tobacco abuse     Past Surgical History:  Procedure Laterality Date  . BUBBLE STUDY  05/23/2019   Procedure: BUBBLE STUDY;  Surgeon: Pixie Casino, MD;  Location: [place];  Service: Cardiovascular;;  . TEE WITHOUT CARDIOVERSION N/A 05/23/2019   Procedure: TRANSESOPHAGEAL ECHOCARDIOGRAM (TEE);  Surgeon: Pixie Casino, MD;  [facility]: [facility] END[facility]COPY;  Service: Cardiovascular;  Laterality: N/A;    There were no vitals filed for this visit.  Subjective Assessment - 06/26/19 1704    Currently in Pain?  Yes    Pain Score  3     Pain Location  Leg    Pain Orientation  Left    Pain Descriptors / Indicators  Aching    Pain Type  Acute pain    Pain Onset  Yesterday    Pain Frequency  Intermittent    Aggravating Factors   climbing stairs    Pain Relieving Factors  rest              CLINIC OPERATION CHANGES: Outpatient Neuro Rehab is open at lower capacity following universal masking, social distancing, and patient screening.  The patient's COVID risk of complications score is 3 Environmental scanning 100% for basic. Planning a mnu for 4 people and generating a grocery list,  min-mod v.c for completeness/ details. Scheduling task to add items to a weekly schedule, min-mod v.c/ difficulty, therpist issued lat items for homework.              OT Education - 06/26/19 1732    Education Details  Recommendation that pt does not drive to therapy due to visual changes and cogntive changes    Person(s) Educated  Patient    Methods  Explanation    Comprehension  Verbalized understanding       OT Short Term Goals - 05/30/19 1210      OT SHORT TERM GOAL #1   Title  Pt/caregiver will verbalize understanding of cognitive compensation strategies for incr safety/independence with ADLs/IADLs.--check STGs 06/29/19    Baseline  dependent    Time  4    Period  Weeks    Status  New      OT SHORT TERM GOAL #2   Title  Pt will perform simple cooking task mod I.    Baseline  dependent/has not attempted    Time  4    Period  Weeks    Status  New      OT SHORT TERM GOAL #3   Title  Pt will perform simple home maintenance task  mod I.    Baseline  dependent/has not attempted    Time  4    Period  Weeks    Status  New      OT SHORT TERM GOAL #4   Title  Pt will perform simple functional organization/problem solving tasks with at least 90% accuracy.    Time  4    Period  Weeks    Status  New        OT Long Term Goals - 05/30/19 1213      OT LONG TERM GOAL #1   Title  Pt will perform simple alternating-divided attention activity (at least 1 cognitive and 1 physical task) with at least 90% accuracy for incr safety/in prep for return to work.    Baseline  pt reports difficulty with attending to simple physical task    Time  8    Period  Weeks    Status  New      OT LONG TERM GOAL #2   Title  Pt will perform mod complex cooking task mod I.    Baseline  dependent/has not attempted    Time  8    Period  Weeks    Status  New      OT LONG TERM GOAL #3   Title  Pt will perform mod complex home maintenance task mod I.    Baseline  dependent/has not  attempted    Time  8    Period  Weeks    Status  New      OT LONG TERM GOAL #4   Title  Pt will perform previous financial management tasks with supervision.    Baseline  needs assist    Time  8    Period  Weeks    Status  New            Plan - 06/26/19 1731    Clinical Impression Statement  Pt is progressing towards goals. HE demosntrates improved performance on environmental scanning today.    OT Occupational Profile and History  Problem Focused Assessment - Including review of records relating to presenting problem    Occupational performance deficits (Please refer to evaluation for details):  ADL's;IADL's;Work;Leisure;Social Participation    Body Structure / Function / Physical Skills  ADL;Decreased knowledge of use of DME;Decreased knowledge of precautions    Cognitive Skills  Attention;Memory;Problem Solve;Safety Awareness;Thought;Understand    Rehab Potential  Good    Clinical Decision Making  Several treatment options, min-mod task modification necessary    Comorbidities Affecting Occupational Performance:  May have comorbidities impacting occupational performance    Modification or Assistance to Complete Evaluation   Min-Moderate modification of tasks or assist with assess necessary to complete eval    OT Frequency  2x / week   recommended, but 1x week at this time due to self pay/financial concerns at pt/family request   OT Duration  8 weeks    OT Treatment/Interventions  Self-care/ADL training;DME and/or AE instruction;Therapeutic activities;Cognitive remediation/compensation;Visual/perceptual remediation/compensation;Patient/family education    Plan  cognition/ visual perceptual skills.    Consulted and Agree with Plan of Care  Patient;Family member/caregiver    Family Member Consulted  ex-wife/caregiver       Patient will benefit from skilled therapeutic intervention in order to improve the following deficits and impairments:   Body Structure / Function / Physical  Skills: ADL, Decreased knowledge of use of DME, Decreased knowledge of precautions Cognitive Skills: Attention, Memory, Problem Solve, Safety Awareness, Thought, Understand  Visit Diagnosis: 1. Other symptoms and signs involving the nervous system   2. Other symptoms and signs involving cognitive functions following cerebral infarction   3. Attention and concentration deficit       Problem List Patient Active Problem List   Diagnosis Date Noted  . HLD (hyperlipidemia) 05/23/2019  . Nicotine abuse 05/23/2019  . CVA (cerebral vascular accident) (HCC) 05/21/2019    Aveion Nguyen 06/26/2019, 5:33 PM Keene BreathKathryn Avyukt Cimo, OTR/L [phone] [facility] [address] [place], [place], [address] Phone: [phone]   Fax:  [phone]  Name: Otilio Miumadou Bagdasarian MRN: [id number] Date of Birth: 05/25/1971

## 2019-06-27 ENCOUNTER — Other Ambulatory Visit: Payer: Self-pay

## 2019-06-27 ENCOUNTER — Ambulatory Visit: Payer: PRIVATE HEALTH INSURANCE | Admitting: Speech Pathology

## 2019-06-27 DIAGNOSIS — I69318 Other symptoms and signs involving cognitive functions following cerebral infarction: Secondary | ICD-10-CM | POA: Diagnosis not present

## 2019-06-27 DIAGNOSIS — R471 Dysarthria and anarthria: Secondary | ICD-10-CM

## 2019-06-27 DIAGNOSIS — R4701 Aphasia: Secondary | ICD-10-CM

## 2019-06-27 DIAGNOSIS — R41841 Cognitive communication deficit: Secondary | ICD-10-CM

## 2019-06-27 NOTE — Therapy (Signed)
[facility] [address] [place], [place], [address] Phone: [phone]   Fax:  [phone]  Speech Language Pathology Treatment  Patient Details  Name: Brandon Summers MRN: [id number] Date of Birth: 1971-06-27 Referring Provider (SLP): Lanae Boast, FNP (PCP appt to establish care 06/07/19)   Encounter Date: 06/27/2019  End of Session - 06/27/19 1356    Visit Number  6    Number of Visits  17    Date for SLP Re-Evaluation  07/29/19    Authorization Type  self-pay; has applied for Medicaid    SLP Start Time  1004    SLP Stop Time   1053    SLP Time Calculation (min)  49 min    Activity Tolerance  Patient tolerated treatment well       Past Medical History:  Diagnosis Date  . Stroke (cerebrum) ([place])   . Tobacco abuse     Past Surgical History:  Procedure Laterality Date  . BUBBLE STUDY  05/23/2019   Procedure: BUBBLE STUDY;  Surgeon: Pixie Casino, MD;  Location: [facility];  Service: Cardiovascular;;  . TEE WITHOUT CARDIOVERSION N/A 05/23/2019   Procedure: TRANSESOPHAGEAL ECHOCARDIOGRAM (TEE);  Surgeon: Pixie Casino, MD;  Location: [facility] ENDOSCOPY;  Service: Cardiovascular;  Laterality: N/A;    There were no vitals filed for this visit.  Subjective Assessment - 06/27/19 1004    Subjective  Pt arrives with daughter    Patient is accompained by:  Family member   daughter Katitiatou   Currently in Pain?  No/denies            ADULT SLP TREATMENT - 06/27/19 1006      General Information   Behavior/Cognition  Alert;Cooperative;Pleasant mood      Treatment Provided   Treatment provided  Cognitive-Linquistic      Pain Assessment   Pain Assessment  No/denies pain      Cognitive-Linquistic Treatment   Treatment focused on  Cognition    Skilled Treatment  Pt arrived with binder and several papers (including PT handout, information re: disability given previous session). Pt told SLP he had driven  himself to last 2 appointments (daughter/ex-wife had been driving him previously). Daughter drove him today and pt reports he is no longer driving. SLP reinforced this due to visual/cognitive deficits. Pt required repetition, rephrasing for multistep commands in simple task (sorting papers in binder). Pt used calendar worksheets to record upcoming appointments (pt recalled 1/3 independently). Extended time required when filling in date/day information, however pt independently referenced previous days to check for accuracy. Pt homework to record events/activities during the day, on each day until next session. Pt/daughter verbalized understanding      Assessment / Recommendations / Plan   Plan  Continue with current plan of care      Progression Toward Goals   Progression toward goals  Progressing toward goals         SLP Short Term Goals - 06/27/19 1345      SLP SHORT TERM GOAL #1   Title  Pt will demo selective attention to therapy tasks for 10 minutes x2 sessions.    Baseline  06/11/19; 06/18/19    Time  1    Period  Weeks    Status  Achieved      SLP SHORT TERM GOAL #2   Title  Pt will establish a memory compensation system and bring to 2 therapy sessions.    Baseline  06/27/19    Time  1    Period  Weeks    Status  On-going      SLP SHORT TERM GOAL #3   Title  Pt will verbally sequence steps in a functional task (eg recipes) with compensations for aphasia x3 sessions.    Time  1    Period  Weeks    Status  Deferred      SLP SHORT TERM GOAL #4   Title  Pt will demo auditory comprehension of mod complex multistep commands >85% accuracy x 3 visits.    Time  1    Period  Weeks    Status  On-going      SLP SHORT TERM GOAL #5   Title  Pt will use compensations for aphasia and dysarthria in 8 minutes simple conversation, x 2 visits with rare min A.    Time  1    Period  Weeks    Status  Deferred   for focus on cognition      SLP Long Term Goals - 06/27/19 1357      SLP  LONG TERM GOAL #1   Title  Pt will demo alternating attention in a functional task for 15 minutes x2 sessions.    Time  5   or 17 sessions, for all LTGs   Period  Weeks    Status  On-going      SLP LONG TERM GOAL #2   Title  Pt will use memory compensation system to recall personal, schedule, or appointment information in 3 therapy sessions.    Time  5    Period  Weeks    Status  On-going      SLP LONG TERM GOAL #3   Title  Pt will engage functionally in 10 minutes mod complex conversation with compensations for aphasia, x2 visits.    Time  5    Period  Weeks    Status  On-going      SLP LONG TERM GOAL #4   Title  Pt will maintain adequate volume and vocal quality in 10 minutes mod complex conversation x2 visits.    Time  5    Period  Weeks    Status  On-going      SLP LONG TERM GOAL #5   Title  Pt will demo auditory comprehension of 10 minutes mod complex conversation by answering questions or commenting appropriately x 2 visits.    Time  5    Period  Weeks    Status  On-going         Patient will benefit from skilled therapeutic intervention in order to improve the following deficits and impairments:   1. Cognitive communication deficit   2. Aphasia   3. Dysarthria and anarthria       Problem List Patient Active Problem List   Diagnosis Date Noted  . HLD (hyperlipidemia) 05/23/2019  . Nicotine abuse 05/23/2019  . CVA (cerebral vascular accident) [facility](HCC) 05/21/2019   Rondel BatonMary Beth Deana Krock, MS, CCC-SLP Speech-Language Pathologist  Arlana LindauMary E Lillien Petronio 06/27/2019, 1:58 PM  [facility] [address] [place], [place], [address] Phone: [phone]   Fax:  [phone]   Name: Brandon Summers MRN: [id number] Date of Birth: 06/01/1971

## 2019-07-02 ENCOUNTER — Ambulatory Visit: Payer: 59 | Attending: Internal Medicine | Admitting: Occupational Therapy

## 2019-07-02 ENCOUNTER — Other Ambulatory Visit: Payer: Self-pay

## 2019-07-02 ENCOUNTER — Ambulatory Visit: Payer: 59 | Admitting: Speech Pathology

## 2019-07-02 DIAGNOSIS — R4701 Aphasia: Secondary | ICD-10-CM | POA: Diagnosis present

## 2019-07-02 DIAGNOSIS — R2689 Other abnormalities of gait and mobility: Secondary | ICD-10-CM | POA: Diagnosis present

## 2019-07-02 DIAGNOSIS — R41841 Cognitive communication deficit: Secondary | ICD-10-CM

## 2019-07-02 DIAGNOSIS — R29818 Other symptoms and signs involving the nervous system: Secondary | ICD-10-CM | POA: Diagnosis present

## 2019-07-02 DIAGNOSIS — R471 Dysarthria and anarthria: Secondary | ICD-10-CM | POA: Insufficient documentation

## 2019-07-02 DIAGNOSIS — I69318 Other symptoms and signs involving cognitive functions following cerebral infarction: Secondary | ICD-10-CM | POA: Insufficient documentation

## 2019-07-02 DIAGNOSIS — R41840 Attention and concentration deficit: Secondary | ICD-10-CM | POA: Insufficient documentation

## 2019-07-02 NOTE — Therapy (Signed)
[facility] [address] [place], [place], [address] Phone: [phone]   Fax:  [phone]  Speech Language Pathology Treatment  Patient Details  Name: Brandon Summers MRN: [id number] Date of Birth: 08/30/1971 Referring Provider (SLP): Mike GipAndre Douglas, FNP (PCP appt to establish care 06/07/19)   Encounter Date: 07/02/2019  End of Session - 07/02/19 1419    Visit Number  7    Number of Visits  17    Date for SLP Re-Evaluation  07/29/19    Authorization Type  self-pay; has applied for Medicaid    SLP Start Time  1317    SLP Stop Time   1405    SLP Time Calculation (min)  48 min    Activity Tolerance  Patient tolerated treatment well       Past Medical History:  Diagnosis Date  . Stroke (cerebrum) (HCC)   . Tobacco abuse     Past Surgical History:  Procedure Laterality Date  . BUBBLE STUDY  05/23/2019   Procedure: BUBBLE STUDY;  Surgeon: Chrystie NoseHilty, Kenneth C, MD;  [facility];  Service: Cardiovascular;;  . TEE WITHOUT CARDIOVERSION N/A 05/23/2019   Procedure: TRANSESOPHAGEAL ECHOCARDIOGRAM (TEE);  Surgeon: Chrystie NoseHilty, Kenneth C, MD;  [facility];  Service: Cardiovascular;  Laterality: N/A;    There were no vitals filed for this visit.  Subjective Assessment - 07/02/19 1413    Subjective  "I didn't even open."    Currently in Pain?  Yes    Pain Score  3     Pain Location  Back            ADULT SLP TREATMENT - 07/02/19 1413      General Information   Behavior/Cognition  Alert;Cooperative;Pleasant mood      Treatment Provided   Treatment provided  Cognitive-Linquistic      Cognitive-Linquistic Treatment   Treatment focused on  Cognition;Aphasia    Skilled Treatment  SLP targeted sustained attention and auditory comprehension in a functional task, reviewing pt's disability paperwork. SLP read instructions aloud for pt. For mod complex information and multistep commands, pt required frequent  rephrasing, gestural and written cues for comprehension. Pt maintained sustained attention for periods of 15 minutes, cues necessary for alternating attention (transferring information).       Assessment / Recommendations / Plan   Plan  Continue with current plan of care      Progression Toward Goals   Progression toward goals  Progressing toward goals         SLP Short Term Goals - 07/02/19 1414      SLP SHORT TERM GOAL #1   Title  Pt will demo selective attention to therapy tasks for 10 minutes x2 sessions.    Baseline  06/11/19; 06/18/19    Time  1    Period  Weeks    Status  Achieved      SLP SHORT TERM GOAL #2   Title  Pt will establish a memory compensation system and bring to 2 therapy sessions.    Baseline  06/27/19, 07/02/19    Time  1    Period  Weeks    Status  Achieved      SLP SHORT TERM GOAL #3   Title  Pt will verbally sequence steps in a functional task (eg recipes) with compensations for aphasia x3 sessions.    Time  1    Period  Weeks    Status  Deferred      SLP  SHORT TERM GOAL #4   Title  Pt will demo auditory comprehension of mod complex multistep commands >85% accuracy x 3 visits.    Time  1    Period  Weeks    Status  On-going      SLP SHORT TERM GOAL #5   Title  Pt will use compensations for aphasia and dysarthria in 8 minutes simple conversation, x 2 visits with rare min A.    Time  1    Period  Weeks    Status  Deferred   for focus on cognition      SLP Long Term Goals - 06/27/19 1357      SLP LONG TERM GOAL #1   Title  Pt will demo alternating attention in a functional task for 15 minutes x2 sessions.    Time  5   or 17 sessions, for all LTGs   Period  Weeks    Status  On-going      SLP LONG TERM GOAL #2   Title  Pt will use memory compensation system to recall personal, schedule, or appointment information in 3 therapy sessions.    Time  5    Period  Weeks    Status  On-going      SLP LONG TERM GOAL #3   Title  Pt will engage  functionally in 10 minutes mod complex conversation with compensations for aphasia, x2 visits.    Time  5    Period  Weeks    Status  On-going      SLP LONG TERM GOAL #4   Title  Pt will maintain adequate volume and vocal quality in 10 minutes mod complex conversation x2 visits.    Time  5    Period  Weeks    Status  On-going      SLP LONG TERM GOAL #5   Title  Pt will demo auditory comprehension of 10 minutes mod complex conversation by answering questions or commenting appropriately x 2 visits.    Time  5    Period  Weeks    Status  On-going       Plan - 07/02/19 1419    Clinical Impression Statement  Brandon Summers presents with moderate cognitive deficits, mild-moderate aphasia (expressive> receptive deficits), and mild dysarthria. Impaired auditory comprehension (mod complex level), recall and organization, as well as slow processing noted in functional cognitive-linguistic tasks today. SLP agrees with pt that cognitive deficits, and to a lesser extent language deficits, would inhibit pt from performing his job safely and effectively. I recommend skilled ST to maximize cognition, language, and speech for safety, effective communication, and increased independence for return to work.    Speech Therapy Frequency  2x / week    Duration  --   8 weeks or 17 visits   Treatment/Interventions  Functional tasks;Patient/family education;Environmental controls;Cognitive reorganization;Multimodal communcation approach;Language facilitation;Compensatory techniques;Internal/external aids;SLP instruction and feedback    Potential to Achieve Goals  Good    Potential Considerations  Financial resources       Patient will benefit from skilled therapeutic intervention in order to improve the following deficits and impairments:   1. Cognitive communication deficit   2. Aphasia       Problem List Patient Active Problem List   Diagnosis Date Noted  . HLD (hyperlipidemia) 05/23/2019  .  Nicotine abuse 05/23/2019  . [facility] 05/21/2019   Deneise Lever, [place], [place] E Nayra Coury 07/02/2019, 2:21  PM  [facility] [address] [place], [place], [address] Phone: [phone]   Fax:  [phone]   Name: Brandon Summers MRN: [id number] Date of Birth: 02/18/1971

## 2019-07-02 NOTE — Therapy (Signed)
[facility] [address] [place], [place], [address] Phone: [phone]   Fax:  [phone]  Occupational Therapy Treatment  Patient Details  Name: Brandon Summers MRN: [id number] Date of Birth: 08/26/1971 Referring Provider (OT): Mike GipAndre Douglas, FNP  (PCP appt to establish care 06/07/19)   Encounter Date: 07/02/2019  OT End of Session - 07/02/19 1412    Visit Number  3    Number of Visits  9    Date for OT Re-Evaluation  07/29/19    Authorization Type  sel pay    OT Start Time  1407    OT Stop Time  1445    OT Time Calculation (min)  38 min    Activity Tolerance  Patient tolerated treatment well    Behavior During Therapy  [facility] for tasks assessed/performed       Past Medical History:  Diagnosis Date  . Stroke (cerebrum) (HCC)   . Tobacco abuse     Past Surgical History:  Procedure Laterality Date  . BUBBLE STUDY  05/23/2019   Procedure: BUBBLE STUDY;  Surgeon: Chrystie NoseHilty, Kenneth C, MD;  [facility];  Service: Cardiovascular;;  . TEE WITHOUT CARDIOVERSION N/A 05/23/2019   Procedure: TRANSESOPHAGEAL ECHOCARDIOGRAM (TEE);  Surgeon: Chrystie NoseHilty, Kenneth C, MD;  [facility];  Service: Cardiovascular;  Laterality: N/A;    There were no vitals filed for this visit.  Subjective Assessment - 07/02/19 1411    Currently in Pain?  Yes    Pain Score  3     Pain Location  Back    Pain Descriptors / Indicators  Aching    Pain Type  Acute pain    Pain Onset  Yesterday    Pain Frequency  Intermittent    Aggravating Factors   standing    Pain Relieving Factors  rest            Treatment: Simple cooking task to fry an egg, min v.c/prompts for pt to locate items. Pt attempted to flip egg without use of a spatula, he nearly spilled hot oil on himself. Therapist prompted pt to use a spatula instead. Pt was able to safely flip egg and he recalled to turn off stove without cueing. Therapist prompted pt to allow pan  to cool prior to placing it in dishwasher. Therapist reviewed safety with patient and importance of use of spatula and cooking with supervision. Completing 12 piece puzzle, mod v.c for problem solving, visual perceptual skills. Spot it game, for matching items, visual perceptual skills, mod difficulty and increased time.                  OT Short Term Goals - 05/30/19 1210      OT SHORT TERM GOAL #1   Title  Pt/caregiver will verbalize understanding of cognitive compensation strategies for incr safety/independence with ADLs/IADLs.--check STGs 06/29/19    Baseline  dependent    Time  4    Period  Weeks    Status  New      OT SHORT TERM GOAL #2   Title  Pt will perform simple cooking task mod I.    Baseline  dependent/has not attempted    Time  4    Period  Weeks    Status  New      OT SHORT TERM GOAL #3   Title  Pt will perform simple home maintenance task mod I.    Baseline  dependent/has not attempted    Time  4    Period  Weeks    Status  New      OT SHORT TERM GOAL #4   Title  Pt will perform simple functional organization/problem solving tasks with at least 90% accuracy.    Time  4    Period  Weeks    Status  New        OT Long Term Goals - 05/30/19 1213      OT LONG TERM GOAL #1   Title  Pt will perform simple alternating-divided attention activity (at least 1 cognitive and 1 physical task) with at least 90% accuracy for incr safety/in prep for return to work.    Baseline  pt reports difficulty with attending to simple physical task    Time  8    Period  Weeks    Status  New      OT LONG TERM GOAL #2   Title  Pt will perform mod complex cooking task mod I.    Baseline  dependent/has not attempted    Time  8    Period  Weeks    Status  New      OT LONG TERM GOAL #3   Title  Pt will perform mod complex home maintenance task mod I.    Baseline  dependent/has not attempted    Time  8    Period  Weeks    Status  New      OT LONG TERM GOAL #4    Title  Pt will perform previous financial management tasks with supervision.    Baseline  needs assist    Time  8    Period  Weeks    Status  New              Patient will benefit from skilled therapeutic intervention in order to improve the following deficits and impairments:           Visit Diagnosis: No diagnosis found.    Problem List Patient Active Problem List   Diagnosis Date Noted  . HLD (hyperlipidemia) 05/23/2019  . Nicotine abuse 05/23/2019  . CVA (cerebral vascular accident) ([place]) 05/21/2019    , 07/02/2019, 3:55 PM  [facility] [address] [place], [place], [address] Phone: [phone]   Fax:  [phone]  Name: Brandon Summers MRN: [id number] Date of Birth: Jan 22, 1971

## 2019-07-04 ENCOUNTER — Ambulatory Visit: Payer: 59 | Admitting: Rehabilitative and Restorative Service Providers"

## 2019-07-04 ENCOUNTER — Encounter: Payer: Self-pay | Admitting: Rehabilitative and Restorative Service Providers"

## 2019-07-04 ENCOUNTER — Other Ambulatory Visit: Payer: Self-pay

## 2019-07-04 VITALS — BP 121/71 | HR 73

## 2019-07-04 DIAGNOSIS — R41840 Attention and concentration deficit: Secondary | ICD-10-CM | POA: Diagnosis not present

## 2019-07-04 DIAGNOSIS — R29818 Other symptoms and signs involving the nervous system: Secondary | ICD-10-CM

## 2019-07-04 DIAGNOSIS — R2689 Other abnormalities of gait and mobility: Secondary | ICD-10-CM

## 2019-07-04 NOTE — Patient Instructions (Addendum)
[facility]  Schedule 8/26 @ 11:10  [name], DO   Address: [address] [name], [place] [address] Phone: [phone]   Access Code: [email or web address]  URL: [email or web address]  Date: 07/04/2019  Prepared by: Rudell Cobb   Program Notes  Walking is a great form of exercise to increase your strength, endurance and overall fitness. A walking program can help you start slowly and gradually build endurance as you go. Everyone's ability is different, so each person's starting point will be different. You do not have to follow them exactly. The are just samples. You should simply find out what's right for you and stick to that program.  In the beginning, you'll start off walking 2-3 times a day for short distances. As you get stronger, you'll be walking further at just 1-2 times per day.  You Can Walk For A Certain Length Of Time Each Day  Walk 6-8 minutes 2 times per day (depending on the weather-- choose early in the morning or later in the evening to avoid the heat of the day). Increase 3-4 minutes every week. Work up to 25-30 minutes (1-2 times per day).  Example: Day 1-2 6-8 minutes 2 times/day Day 7-8 10-12 minutes 2 times/day Day 13-14 14-16 minutes 1-2 times per day    Exercises Standing March - 20 reps - 1 sets - 2x daily - 7x weekly Sideways Walking - 10 reps - 1 sets - 1x daily - 7x weekly

## 2019-07-04 NOTE — Therapy (Signed)
[facility] [address] [place], [place], [address] Phone: [phone]   Fax:  [phone]  Physical Therapy Treatment and Discharge Summary  Patient Details  Name: Brandon Summers MRN: [id number] Date of Birth: 10/24/1971 Referring Provider (PT): Lanae Boast, NP   *Referred by hospitalist Debbe Odea, MD, however will f/u with Lanae Boast.  CLINIC OPERATION CHANGES: Outpatient Neuro Rehab is open at lower capacity following universal masking, social distancing, and patient screening.  The patient's COVID risk of complications score is 1.  Encounter Date: 07/04/2019  PT End of Session - 07/04/19 1525    Visit Number  2    Number of Visits  4    Date for PT Re-Evaluation  08/09/19    Authorization Type  has applied for medicaid/ self pay at this time.    PT Start Time  1455    PT Stop Time  1528    PT Time Calculation (min)  33 min       Past Medical History:  Diagnosis Date  . Stroke (cerebrum) ([facility])   . Tobacco abuse     Past Surgical History:  Procedure Laterality Date  . BUBBLE STUDY  05/23/2019   Procedure: BUBBLE STUDY;  Surgeon: Pixie Casino, MD;  Location: [facility];  Service: Cardiovascular;;  . TEE WITHOUT CARDIOVERSION N/A 05/23/2019   Procedure: TRANSESOPHAGEAL ECHOCARDIOGRAM (TEE);  Surgeon: Pixie Casino, MD;  Location: [facility] END[facility]COPY;  Service: Cardiovascular;  Laterality: N/A;    Vitals:   07/04/19 1458  BP: 121/71  Pulse: 73    Subjective Assessment - 07/04/19 1458    Subjective  The patient reports he continues with L sided headache and heaviness near his left eye that comes and goes.  He notes intermittent symptoms where sometimes he can talk well and other times he cannot.    Patient Stated Goals  His main goal involves talking better.  He also notes getting stronger.    Currently in Pain?  Yes    Pain Score  --   "not very strong"        [facility] PT Assessment - 07/04/19 1516       Ambulation/Gait   Ambulation/Gait  Yes    Ambulation/Gait Assistance  7: Independent    Ambulation Distance (Feet)  1150 Feet    Assistive device  None    Ambulation Surface  Level;Indoor      6 minute walk test results    Aerobic Endurance Distance Walked  1150                   [facility] Adult PT Treatment/Exercise - 07/04/19 1516      Self-Care   Self-Care  Other Self-Care Comments    Other Self-Care Comments   discussed continued progression of HEP and home walking      Neuro Re-ed    Neuro Re-ed Details   Marching for balance and hip strengthening, sidestepping without UE support.             PT Education - 07/04/19 1525    Education Details  Updated HEP and added marching, and sidestepping    Person(s) Educated  Patient    Methods  Explanation;Demonstration;Handout    Comprehension  Verbalized understanding;Returned demonstration          PT Long Term Goals - 07/04/19 1517      PT LONG TERM GOAL #1   Title  The patient will be indep with HEP for LE strengthening,  walking program for endurance.    Baseline  No current HEP    Time  4    Period  Weeks    Status  Achieved      PT LONG TERM GOAL #2   Title  Increase 6 minute walk test by 120 feet in 6 minutes to demonstrate improving community mobility.    Baseline  1005 ft in 6 minutes at eval/ 1150 at discharge.    Time  4    Period  Weeks    Status  Achieved            Plan - 07/04/19 2229    Clinical Impression Statement  The patient met 2 LTGs.  He continues with fatigue and has a home program to progress post d/c.  The patient is physically independent with moiblity and does not score in a high fall risk range.  PT recommended he continue to progress his home program.    PT Treatment/Interventions  ADLs/Self Care Home Management;Neuromuscular re-education;Patient/family education;Gait training;Stair training;Functional mobility training;Therapeutic exercise;Therapeutic  activities;Balance training    PT Next Visit Plan  Discharge    Consulted and Agree with Plan of Care  Patient       Patient will benefit from skilled therapeutic intervention in order to improve the following deficits and impairments:  Abnormal gait, Decreased strength  Visit Diagnosis: 1. Other abnormalities of gait and mobility   2. Other symptoms and signs involving the nervous system       PHYSICAL THERAPY DISCHARGE SUMMARY  Visits from Start of Care: 2  Current functional level related to goals / functional outcomes: See goals   Remaining deficits: fatigue   Education / Equipment: Has home program, home walking program.  Plan: Patient agrees to discharge.  Patient goals were met. Patient is being discharged due to meeting the stated rehab goals.  ?????          Thank you for the referral of this patient. Rudell Cobb, MPT  [address], [place] 07/04/2019, 10:44 PM  [facility] [address] [place], [place], [address] Phone: [phone]   Fax:  [phone]  Name: Brandon Summers MRN: [id number] Date of Birth: 10-17-1971

## 2019-07-05 ENCOUNTER — Other Ambulatory Visit: Payer: Self-pay | Admitting: Nurse Practitioner

## 2019-07-05 DIAGNOSIS — I639 Cerebral infarction, unspecified: Secondary | ICD-10-CM

## 2019-07-05 DIAGNOSIS — I4891 Unspecified atrial fibrillation: Secondary | ICD-10-CM

## 2019-07-09 ENCOUNTER — Ambulatory Visit: Payer: 59 | Admitting: Occupational Therapy

## 2019-07-09 ENCOUNTER — Other Ambulatory Visit: Payer: Self-pay

## 2019-07-09 ENCOUNTER — Ambulatory Visit: Payer: 59

## 2019-07-09 ENCOUNTER — Encounter: Payer: Self-pay | Admitting: Occupational Therapy

## 2019-07-09 ENCOUNTER — Ambulatory Visit: Payer: 59 | Admitting: Speech Pathology

## 2019-07-09 DIAGNOSIS — R4701 Aphasia: Secondary | ICD-10-CM

## 2019-07-09 DIAGNOSIS — R41840 Attention and concentration deficit: Secondary | ICD-10-CM | POA: Diagnosis not present

## 2019-07-09 DIAGNOSIS — I69318 Other symptoms and signs involving cognitive functions following cerebral infarction: Secondary | ICD-10-CM

## 2019-07-09 DIAGNOSIS — R41841 Cognitive communication deficit: Secondary | ICD-10-CM

## 2019-07-09 NOTE — Therapy (Signed)
[facility] [address] [place], [place], [address] Phone: [phone]   Fax:  [phone]  Occupational Therapy Treatment  Patient Details  Name: Brandon Summers MRN: [id number] Date of Birth: 13-May-1971 Referring Provider (OT): Lanae Boast, FNP  (PCP appt to establish care 06/07/19)   Encounter Date: 07/09/2019  OT End of Session - 07/09/19 1242    Visit Number  4    Number of Visits  9    Date for OT Re-Evaluation  07/29/19    Authorization Type  self pay    OT Start Time  1235    OT Stop Time  1315    OT Time Calculation (min)  40 min    Activity Tolerance  Patient tolerated treatment well    Behavior During Therapy  [facility] for tasks assessed/performed       Past Medical History:  Diagnosis Date  . Stroke (cerebrum) ([place])   . Tobacco abuse     Past Surgical History:  Procedure Laterality Date  . BUBBLE STUDY  05/23/2019   Procedure: BUBBLE STUDY;  Surgeon: Pixie Casino, MD;  Location: [place];  Service: Cardiovascular;;  . TEE WITHOUT CARDIOVERSION N/A 05/23/2019   Procedure: TRANSESOPHAGEAL ECHOCARDIOGRAM (TEE);  Surgeon: Pixie Casino, MD;  Location: [facility] ENDOSCOPY;  Service: Cardiovascular;  Laterality: N/A;    There were no vitals filed for this visit.  Subjective Assessment - 07/09/19 1240    Subjective   pt reports no new medical changes, same meds    Pertinent History  CVA 05/21/19; PMH:  HLD, nicotine abuse    Limitations  cognitive deficits, language deficits    Patient Stated Goals  return to work, independence    Currently in Pain?  No/denies    Pain Onset  Yesterday         Pt able to copy small peg design with incr time, min cueing initially for directions/clarification.  Then good accuracy.  Matching clock faces with digital times with incr time, min cueing for directions, then good accuracy.  Simple change making sheet (out of $2).  Pt with max difficulty, therefore, used play  money to assist with mod-max cues.  Only completed first 3 items due to difficulty and time constraints.  Pt to complete remaining for homework.    Pt reports that his dtr does not allow him to do any cooking/cleaning at home.  Encouraged pt to participate in these activities with supervision/assist.       OT Short Term Goals - 05/30/19 1210      OT SHORT TERM GOAL #1   Title  Pt/caregiver will verbalize understanding of cognitive compensation strategies for incr safety/independence with ADLs/IADLs.--check STGs 06/29/19    Baseline  dependent    Time  4    Period  Weeks    Status  New      OT SHORT TERM GOAL #2   Title  Pt will perform simple cooking task mod I.    Baseline  dependent/has not attempted    Time  4    Period  Weeks    Status  New      OT SHORT TERM GOAL #3   Title  Pt will perform simple home maintenance task mod I.    Baseline  dependent/has not attempted    Time  4    Period  Weeks    Status  New      OT SHORT TERM GOAL #4   Title  Pt  will perform simple functional organization/problem solving tasks with at least 90% accuracy.    Time  4    Period  Weeks    Status  New        OT Long Term Goals - 05/30/19 1213      OT LONG TERM GOAL #1   Title  Pt will perform simple alternating-divided attention activity (at least 1 cognitive and 1 physical task) with at least 90% accuracy for incr safety/in prep for return to work.    Baseline  pt reports difficulty with attending to simple physical task    Time  8    Period  Weeks    Status  New      OT LONG TERM GOAL #2   Title  Pt will perform mod complex cooking task mod I.    Baseline  dependent/has not attempted    Time  8    Period  Weeks    Status  New      OT LONG TERM GOAL #3   Title  Pt will perform mod complex home maintenance task mod I.    Baseline  dependent/has not attempted    Time  8    Period  Weeks    Status  New      OT LONG TERM GOAL #4   Title  Pt will perform previous financial  management tasks with supervision.    Baseline  needs assist    Time  8    Period  Weeks    Status  New            Plan - 07/09/19 1243    Clinical Impression Statement  Pt is progressing slowly towards goals with improved visual scanning and attention.  However, pt demo difficulty with functional problem solving for simple money management.    OT Occupational Profile and History  Problem Focused Assessment - Including review of records relating to presenting problem    Occupational performance deficits (Please refer to evaluation for details):  ADL's;IADL's;Work;Leisure;Social Participation    Body Structure / Function / Physical Skills  ADL;Decreased knowledge of use of DME;Decreased knowledge of precautions    Cognitive Skills  Attention;Memory;Problem Solve;Safety Awareness;Thought;Understand    Rehab Potential  Good    Clinical Decision Making  Several treatment options, min-mod task modification necessary    Comorbidities Affecting Occupational Performance:  May have comorbidities impacting occupational performance    Modification or Assistance to Complete Evaluation   Min-Moderate modification of tasks or assist with assess necessary to complete eval    OT Frequency  2x / week   recommended, but 1x week at this time due to self pay/financial concerns at pt/family request   OT Duration  8 weeks    OT Treatment/Interventions  Self-care/ADL training;DME and/or AE instruction;Therapeutic activities;Cognitive remediation/compensation;Visual/perceptual remediation/compensation;Patient/family education    Plan  continue with functional cognition/ visual perceptual skills.  Check homework (simple change making)    Consulted and Agree with Plan of Care  Patient;Family member/caregiver    Family Member Consulted  ex-wife/caregiver       Patient will benefit from skilled therapeutic intervention in order to improve the following deficits and impairments:   Body Structure / Function /  Physical Skills: ADL, Decreased knowledge of use of DME, Decreased knowledge of precautions Cognitive Skills: Attention, Memory, Problem Solve, Safety Awareness, Thought, Understand     Visit Diagnosis: 1. Other symptoms and signs involving cognitive functions following cerebral infarction   2. Attention and concentration deficit  Problem List Patient Active Problem List   Diagnosis Date Noted  . HLD (hyperlipidemia) 05/23/2019  . Nicotine abuse 05/23/2019  . CVA (cerebral vascular accident) [facility](HCC) 05/21/2019    [facility]construct[facility] SurgeryFREEMAN,Brandon Summers 07/09/2019, 2:58 PM  [facility] [address] [place], [place], [address] Phone: [phone]   Fax:  [phone]  Name: Otilio Miumadou Dorantes MRN: [id number] Date of Birth: 03/31/1971   Willa FraterAngela Glynnis Gavel, OTR/L [facility] [address] [place], [place]  [address] [phone] phone [phone] 07/09/19 2:58 PM

## 2019-07-09 NOTE — Patient Instructions (Signed)
Organize your therapy papers in your binder. Put papers in the right section.  If you have to make a phone call, write yourself a script. Include even the obvious things, like your name, phone number and birthday. Even if you know it, write it down.  Homework:  Write down some questions that you want to ask your stroke doctor. Write down a script to call the social security office to find out if you filled out all of your disability paperwork or if more items are needed.

## 2019-07-09 NOTE — Therapy (Signed)
[facility] [address] [place], [place], [address] Phone: [phone]   Fax:  [phone]  Speech Language Pathology Treatment  Patient Details  Name: Brandon Summers MRN: [id number] Date of Birth: 07/05/1971 Referring Provider (SLP): Lanae Boast, FNP (PCP appt to establish care 06/07/19)   Encounter Date: 07/09/2019  End of Session - 07/09/19 1305    Visit Number  8    Number of Visits  17    Date for SLP Re-Evaluation  07/29/19    Authorization Type  self-pay; has applied for Medicaid    SLP Start Time  1146    SLP Stop Time   1232    SLP Time Calculation (min)  46 min    Activity Tolerance  Patient tolerated treatment well       Past Medical History:  Diagnosis Date  . Stroke (cerebrum) ([place])   . Tobacco abuse     Past Surgical History:  Procedure Laterality Date  . BUBBLE STUDY  05/23/2019   Procedure: BUBBLE STUDY;  Surgeon: Pixie Casino, MD;  Location: [facility];  Service: Cardiovascular;;  . TEE WITHOUT CARDIOVERSION N/A 05/23/2019   Procedure: TRANSESOPHAGEAL ECHOCARDIOGRAM (TEE);  Surgeon: Pixie Casino, MD;  Location[facility]OSCOPY;  Service: Cardiovascular;  Laterality: N/A;    There were no vitals filed for this visit.  Subjective Assessment - 07/09/19 1250    Subjective  "On the phone, I don't know what to do."    Currently in Pain?  No/denies            ADULT SLP TREATMENT - 07/09/19 1250      General Information   Behavior/Cognition  Alert;Cooperative;Pleasant mood;Distractible      Treatment Provided   Treatment provided  Cognitive-Linquistic      Cognitive-Linquistic Treatment   Treatment focused on  Aphasia;Cognition    Skilled Treatment  Pt arrived with binder; papers disorganized and scattered throughout. He reports daughter assisted with completing disability/medicaid applications but appeared unsure of whether he had submitted all necessary paperwork. Pt played SLP a  message left by Medicaid determination representative; pt did not comprehend content of this message; usual mod written and verbal cues required from SLP for pt to comprehend actions requested in the message. Pt replayed message x5 in order to record a 10 digit phone number and 4 digit extension; pt required mod cue from SLP for awareness of incorrect digit. Pt told SLP he has avoided taking and making phone calls due to his difficulties communicating. Educated re: using notes or script, pre-planning for phone calls. Pt generated an appropriate script for Medicaid call with ususal mod cues. SLP role-played this script with pt prior to making phone call; with usual cues necessary for adequate vocal intensity, overarticulation. When making phone call, pt navigated menu successfully and was able to leave his message using script.       Assessment / Recommendations / Plan   Plan  Continue with current plan of care;Goals updated      Progression Toward Goals   Progression toward goals  Goals downgraded         SLP Short Term Goals - 07/09/19 1251      SLP SHORT TERM GOAL #1   Title  Pt will demo selective attention to therapy tasks for 10 minutes x2 sessions.    Baseline  06/11/19; 06/18/19    Time  1    Period  Weeks    Status  Achieved  SLP SHORT TERM GOAL #2   Title  Pt will establish a memory compensation system and bring to 2 therapy sessions.    Baseline  06/27/19, 07/02/19    Time  1    Period  Weeks    Status  Achieved      SLP SHORT TERM GOAL #3   Title  Pt will verbally sequence steps in a functional task (eg recipes) with compensations for aphasia x3 sessions.    Time  1    Period  Weeks    Status  Deferred      SLP SHORT TERM GOAL #4   Title  Pt will demo auditory comprehension of mod complex multistep commands >85% accuracy x 3 visits.    Time  1    Period  Weeks    Status  Not Met      SLP SHORT TERM GOAL #5   Title  Pt will use compensations for aphasia and  dysarthria in 8 minutes simple conversation, x 2 visits with rare min A.    Time  1    Period  Weeks    Status  Deferred   for focus on cognition      SLP Long Term Goals - 07/09/19 1251      SLP LONG TERM GOAL #1   Title  Pt will demo alternating attention in a functional task for 15 minutes x2 sessions.    Time  4   or 17 sessions, for all LTGs   Period  Weeks    Status  On-going      SLP LONG TERM GOAL #2   Title  Pt will use memory compensation system to recall personal, schedule, or appointment information in 3 therapy sessions.    Baseline  07/09/19    Time  4    Period  Weeks    Status  On-going      SLP LONG TERM GOAL #3   Title  Pt will engage functionally in 10 minutes mod complex conversation with compensations for aphasia, x2 visits.    Time  4    Period  Weeks    Status  On-going      SLP LONG TERM GOAL #4   Title  Pt will maintain adequate volume and vocal quality in 5 minutes mod complex conversation x2 visits.    Time  4    Period  Weeks    Status  Revised      SLP LONG TERM GOAL #5   Title  Pt will demo auditory comprehension of 10 minutes simple-mod complex conversation by answering questions or commenting appropriately x 2 visits.    Time  4    Period  Weeks    Status  Revised      Additional Long Term Goals   Additional Long Term Goals  Yes      SLP LONG TERM GOAL #6   Title  Pt will demo or eport success with phone calls (medical, disability services, personal) with use of compensations over 3 therapy sessions.    Time  4    Period  Weeks    Status  New       Plan - 07/09/19 1257    Clinical Impression Statement  [name] presents with moderate cognitive deficits, mild-moderate aphasia (expressive> receptive deficits), and mild dysarthria. Impaired auditory comprehension (mod complex level), recall and organization, as well as slow processing noted in functional cognitive-linguistic tasks today. SLP agrees with pt that cognitive  deficits, and to a lesser extent language deficits, would inhibit pt from performing his job safely and effectively. I recommend skilled ST to maximize cognition, language, and speech for safety, effective communication, and increased independence for return to work.    Speech Therapy Frequency  2x / week    Duration  --   8 weeks or 17 visits   Treatment/Interventions  Functional tasks;Patient/family education;Environmental controls;Cognitive reorganization;Multimodal communcation approach;Language facilitation;Compensatory techniques;Internal/external aids;SLP instruction and feedback    Potential to Achieve Goals  Good    Potential Considerations  Financial resources       Patient will benefit from skilled therapeutic intervention in order to improve the following deficits and impairments:   1. Aphasia   2. Cognitive communication deficit       Problem List Patient Active Problem List   Diagnosis Date Noted  . HLD (hyperlipidemia) 05/23/2019  . Nicotine abuse 05/23/2019  . CVA (cerebral vascular accident) [facility]) 05/21/2019   Deneise Lever, [place], [employer] 07/09/2019, 1:06 PM  [facility] [address] [place], [place], [address] Phone: [phone]   Fax:  [phone]   Name: Brandon Summers MRN: [id number] Date of Birth: 19-Jul-1971

## 2019-07-10 ENCOUNTER — Ambulatory Visit: Payer: 59 | Admitting: Rehabilitative and Restorative Service Providers"

## 2019-07-17 ENCOUNTER — Ambulatory Visit: Payer: PRIVATE HEALTH INSURANCE | Admitting: Rehabilitative and Restorative Service Providers"

## 2019-07-17 ENCOUNTER — Other Ambulatory Visit: Payer: Self-pay

## 2019-07-17 ENCOUNTER — Encounter: Payer: Self-pay | Admitting: Speech Pathology

## 2019-07-17 ENCOUNTER — Ambulatory Visit: Payer: 59 | Admitting: Occupational Therapy

## 2019-07-17 ENCOUNTER — Ambulatory Visit: Payer: 59 | Admitting: Speech Pathology

## 2019-07-17 DIAGNOSIS — R471 Dysarthria and anarthria: Secondary | ICD-10-CM

## 2019-07-17 DIAGNOSIS — R4701 Aphasia: Secondary | ICD-10-CM

## 2019-07-17 DIAGNOSIS — R41841 Cognitive communication deficit: Secondary | ICD-10-CM

## 2019-07-17 DIAGNOSIS — R41840 Attention and concentration deficit: Secondary | ICD-10-CM | POA: Diagnosis not present

## 2019-07-17 DIAGNOSIS — I69318 Other symptoms and signs involving cognitive functions following cerebral infarction: Secondary | ICD-10-CM

## 2019-07-17 DIAGNOSIS — R29818 Other symptoms and signs involving the nervous system: Secondary | ICD-10-CM

## 2019-07-17 NOTE — Patient Instructions (Signed)
    [place] something simple and familiar with help (supervision).  2.  Try to do simple cleaning tasks (washing dishes, sweeping, making bed).

## 2019-07-17 NOTE — Therapy (Signed)
[facility] [address] [place], [place], [address] Phone: [phone]   Fax:  [phone]  Occupational Therapy Treatment  Patient Details  Name: Brandon Summers MRN: [id number] Date of Birth: 1971/03/31 Referring Provider (OT): Lanae Boast, FNP  (PCP appt to establish care 06/07/19)   Encounter Date: 07/17/2019  OT End of Session - 07/17/19 1358    Visit Number  5    Number of Visits  9    Date for OT Re-Evaluation  07/29/19    Authorization Type  self pay    OT Start Time  1320    OT Stop Time  1400    OT Time Calculation (min)  40 min    Activity Tolerance  Patient tolerated treatment well    Behavior During Therapy  [facility] for tasks assessed/performed       Past Medical History:  Diagnosis Date  . Stroke (cerebrum) ([place])   . Tobacco abuse     Past Surgical History:  Procedure Laterality Date  . BUBBLE STUDY  05/23/2019   Procedure: BUBBLE STUDY;  Surgeon: Pixie Casino, MD;  Location: [facility];  Service: Cardiovascular;;  . TEE WITHOUT CARDIOVERSION N/A 05/23/2019   Procedure: TRANSESOPHAGEAL ECHOCARDIOGRAM (TEE);  Surgeon: Pixie Casino, MD;  Location: [facility] ENDOSCOPY;  Service: Cardiovascular;  Laterality: N/A;    There were no vitals filed for this visit.  Subjective Assessment - 07/17/19 1330    Subjective   "the masks are too much... makes it hard to remember people"    Pertinent History  CVA 05/21/19; PMH:  HLD, nicotine abuse    Limitations  cognitive deficits, language deficits    Patient Stated Goals  return to work, independence    Currently in Pain?  No/denies    Pain Score  6     Pain Location  Head    Pain Descriptors / Indicators  Aching    Pain Type  Acute pain    Pain Onset  In the past 7 days    Pain Frequency  Constant    Aggravating Factors   too much noise    Pain Relieving Factors  sleep, pain meds       Copying PVC design:  Pt needed mod cueing for initiation for first  design and min cueing for problem solving and to id/correct errors.  Then on 2nd design, improved problem solving but still needed min cueing for 2 errors (incorrect sized pieces).  Completing 12 piece puzzle for problem solving and visual scanning with incr time, but no cueing and good accuracy.  1 short break due to difficulty with attention.    Checked simple change making homework.  Pt completed with good accuracy.     OT Education - 07/17/19 1444    Education Details  Pt encouraged to incr functional/ADL participation at home with supervision/assist for improved cognitive skills and independence.    Person(s) Educated  Patient    Methods  Handout;Explanation    Comprehension  Verbalized understanding       OT Short Term Goals - 05/30/19 1210      OT SHORT TERM GOAL #1   Title  Pt/caregiver will verbalize understanding of cognitive compensation strategies for incr safety/independence with ADLs/IADLs.--check STGs 06/29/19    Baseline  dependent    Time  4    Period  Weeks    Status  New      OT SHORT TERM GOAL #2   Title  Pt will perform  simple cooking task mod I.    Baseline  dependent/has not attempted    Time  4    Period  Weeks    Status  New      OT SHORT TERM GOAL #3   Title  Pt will perform simple home maintenance task mod I.    Baseline  dependent/has not attempted    Time  4    Period  Weeks    Status  New      OT SHORT TERM GOAL #4   Title  Pt will perform simple functional organization/problem solving tasks with at least 90% accuracy.    Time  4    Period  Weeks    Status  New        OT Long Term Goals - 05/30/19 1213      OT LONG TERM GOAL #1   Title  Pt will perform simple alternating-divided attention activity (at least 1 cognitive and 1 physical task) with at least 90% accuracy for incr safety/in prep for return to work.    Baseline  pt reports difficulty with attending to simple physical task    Time  8    Period  Weeks    Status  New      OT  LONG TERM GOAL #2   Title  Pt will perform mod complex cooking task mod I.    Baseline  dependent/has not attempted    Time  8    Period  Weeks    Status  New      OT LONG TERM GOAL #3   Title  Pt will perform mod complex home maintenance task mod I.    Baseline  dependent/has not attempted    Time  8    Period  Weeks    Status  New      OT LONG TERM GOAL #4   Title  Pt will perform previous financial management tasks with supervision.    Baseline  needs assist    Time  8    Period  Weeks    Status  New            Plan - 07/17/19 1358    Clinical Impression Statement  Pt continues to have difficutly/require incr time for functional problem solving and visual scanning, but improved with repetition.    OT Occupational Profile and History  Problem Focused Assessment - Including review of records relating to presenting problem    Occupational performance deficits (Please refer to evaluation for details):  ADL's;IADL's;Work;Leisure;Social Participation    Body Structure / Function / Physical Skills  ADL;Decreased knowledge of use of DME;Decreased knowledge of precautions    Cognitive Skills  Attention;Memory;Problem Solve;Safety Awareness;Thought;Understand    Rehab Potential  Good    Clinical Decision Making  Several treatment options, min-mod task modification necessary    Comorbidities Affecting Occupational Performance:  May have comorbidities impacting occupational performance    Modification or Assistance to Complete Evaluation   Min-Moderate modification of tasks or assist with assess necessary to complete eval    OT Frequency  2x / week   recommended, but 1x week at this time due to self pay/financial concerns at pt/family request   OT Duration  8 weeks    OT Treatment/Interventions  Self-care/ADL training;DME and/or AE instruction;Therapeutic activities;Cognitive remediation/compensation;Visual/perceptual remediation/compensation;Patient/family education    Plan   continue with functional cognition/ visual perceptual skills.    Consulted and Agree with Plan of Care  Patient;Family member/caregiver  Family Member Consulted  ex-wife/caregiver       Patient will benefit from skilled therapeutic intervention in order to improve the following deficits and impairments:   Body Structure / Function / Physical Skills: ADL, Decreased knowledge of use of DME, Decreased knowledge of precautions Cognitive Skills: Attention, Memory, Problem Solve, Safety Awareness, Thought, Understand     Visit Diagnosis: 1. Other symptoms and signs involving cognitive functions following cerebral infarction   2. Attention and concentration deficit   3. Other symptoms and signs involving the nervous system       Problem List Patient Active Problem List   Diagnosis Date Noted  . HLD (hyperlipidemia) 05/23/2019  . Nicotine abuse 05/23/2019  . CVA (cerebral vascular accident) [facility](HCC) 05/21/2019    [facility],ANGELA 07/17/2019, 2:45 PM  [facility] [address] [place], [place], [address] Phone: [phone]   Fax:  [phone]  Name: Brandon Summers MRN: [id number] Date of Birth: 07/16/1971   Willa FraterAngela Freeman, OTR/L [facility] [address] [place], [place]  [address] [phone] 07/17/19 2:45 PM

## 2019-07-17 NOTE — Patient Instructions (Signed)
   Take this to the doctor to help you  I'm getting headaches  I get a headache when I talk loud  Left side of head and eye feel tight has fluid  Visual field cut - I have to turn my head to see different thigs  Need to see an eye doctor  When I lay down I get too much spit and I keep a cup to spit in  Ask for a handout about what you talked about with the doctor or take some notes  Right hand weakness and some pain

## 2019-07-18 NOTE — Therapy (Signed)
[facility] [address] [place], [place], [address] Phone: [phone]   Fax:  [phone]  Speech Language Pathology Treatment  Patient Details  Name: Brandon Summers MRN: [id number] Date of Birth: 1971/09/17 Referring Provider (SLP): Lanae Boast, FNP (PCP appt to establish care 06/07/19)   Encounter Date: 07/17/2019  End of Session - 07/18/19 1713    Visit Number  9    Number of Visits  17    Date for SLP Re-Evaluation  07/29/19    SLP Start Time  7680    SLP Stop Time   8811    SLP Time Calculation (min)  42 min    Activity Tolerance  Patient tolerated treatment well       Past Medical History:  Diagnosis Date  . Stroke (cerebrum) ([facility])   . Tobacco abuse     Past Surgical History:  Procedure Laterality Date  . BUBBLE STUDY  05/23/2019   Procedure: BUBBLE STUDY;  Surgeon: Pixie Casino, MD;  Location: [facility];  Service: Cardiovascular;;  . TEE WITHOUT CARDIOVERSION N/A 05/23/2019   Procedure: TRANSESOPHAGEAL ECHOCARDIOGRAM (TEE);  Surgeon: Pixie Casino, MD;  Location: [facility] ENDOSCOPY;  Service: Cardiovascular;  Laterality: N/A;    There were no vitals filed for this visit.  Subjective Assessment - 07/18/19 1659    Subjective  "I had a friend call Social Security for me - they will send paperwork on 26th"    Currently in Pain?  No/denies            ADULT SLP TREATMENT - 07/17/19 1251      General Information   Behavior/Cognition  Alert;Cooperative;Pleasant mood;Distractible      Treatment Provided   Treatment provided  Cognitive-Linquistic      Cognitive-Linquistic Treatment   Treatment focused on  Aphasia;Cognition    Skilled Treatment  Pt arrives without binder. He explained he had some help calling Social Security. Pt does report writing down information and questions he has to say on business phone calls and states this does help him communicate and assists with word finding duirng  calls. When I asked pt when his next MD appointment his, he spontaneously pulled out his phone. He required exteneded time, but located his [facility] chart and his therapy and MD appointments. He states he sees the doctor alone, per his preference. We discussed the questions and concerns he has for his doctor visit on Friday. Generated list of questions to take to his appointment with usual questioning cues. Pt afrims his volume is low, however, attempts to speak louder result in a headache. Compensations for dysarthria, dysfluencies and word finding utilizing slow rate with rare min A. Pt verbalizes "I have to speak slowly or it doesn't come out right" Intelligibility continues to be affected by dysarthria and aphasia      Assessment / Recommendations / Plan   Plan  Continue with current plan of care;Goals updated      Progression Toward Goals   Progression toward goals  Progressing toward goals       SLP Education - 07/18/19 1708    Education Details  continue taking notes before phone calls; bring question list to your MD appointment    Person(s) Educated  Patient    Methods  Explanation;Verbal cues;Handout    Comprehension  Verbalized understanding;Need further instruction;Returned demonstration       SLP Short Term Goals - 07/18/19 1711      SLP SHORT TERM GOAL #1   Title  Pt will demo selective attention to therapy tasks for 10 minutes x2 sessions.    Baseline  06/11/19; 06/18/19    Time  1    Period  Weeks    Status  Achieved      SLP SHORT TERM GOAL #2   Title  Pt will establish a memory compensation system and bring to 2 therapy sessions.    Baseline  06/27/19, 07/02/19    Time  1    Period  Weeks    Status  Achieved      SLP SHORT TERM GOAL #3   Title  Pt will verbally sequence steps in a functional task (eg recipes) with compensations for aphasia x3 sessions.    Time  1    Period  Weeks    Status  Deferred      SLP SHORT TERM GOAL #4   Title  Pt will demo auditory  comprehension of mod complex multistep commands >85% accuracy x 3 visits.    Time  1    Period  Weeks    Status  Not Met      SLP SHORT TERM GOAL #5   Title  Pt will use compensations for aphasia and dysarthria in 8 minutes simple conversation, x 2 visits with rare min A.    Time  1    Period  Weeks    Status  Deferred   for focus on cognition      SLP Long Term Goals - 07/18/19 1711      SLP LONG TERM GOAL #1   Title  Pt will demo alternating attention in a functional task for 15 minutes x2 sessions.    Time  3   or 17 sessions, for all LTGs   Period  Weeks    Status  On-going      SLP LONG TERM GOAL #2   Title  Pt will use memory compensation system to recall personal, schedule, or appointment information in 3 therapy sessions.    Baseline  07/09/19; 07/17/19;    Time  3    Period  Weeks    Status  On-going      SLP LONG TERM GOAL #3   Title  Pt will engage functionally in 10 minutes mod complex conversation with compensations for aphasia, x2 visits.    Time  3    Period  Weeks    Status  On-going      SLP LONG TERM GOAL #4   Title  Pt will maintain adequate volume and vocal quality in 5 minutes mod complex conversation x2 visits.    Time  3    Period  Weeks    Status  Revised      SLP LONG TERM GOAL #5   Title  Pt will demo auditory comprehension of 10 minutes simple-mod complex conversation by answering questions or commenting appropriately x 2 visits.    Time  3    Period  Weeks    Status  Revised      SLP LONG TERM GOAL #6   Title  Pt will demo or eport success with phone calls (medical, disability services, personal) with use of compensations over 3 therapy sessions.    Baseline  07/17/19    Time  4    Period  Weeks    Status  New       Plan - 07/18/19 1709    Clinical Impression Statement  [name] presents with moderate cognitive deficits, mild-moderate  aphasia (expressive> receptive deficits), and mild dysarthria. Impaired auditory  comprehension (mod complex level), recall and organization, as well as slow processing noted in functional cognitive-linguistic tasks today. SLP agrees with pt that cognitive deficits, and to a lesser extent language deficits, would inhibit pt from performing his job safely and effectively. I recommend skilled ST to maximize cognition, language, and speech for safety, effective communication, and increased independence for return to work.    Speech Therapy Frequency  2x / week    Duration  --   8 weeks or 17 visits   Treatment/Interventions  Functional tasks;Patient/family education;Environmental controls;Cognitive reorganization;Multimodal communcation approach;Language facilitation;Compensatory techniques;Internal/external aids;SLP instruction and feedback    Potential to Achieve Goals  Good       Patient will benefit from skilled therapeutic intervention in order to improve the following deficits and impairments:   Aphasia  Cognitive communication deficit  Dysarthria and anarthria    Problem List Patient Active Problem List   Diagnosis Date Noted  . HLD (hyperlipidemia) 05/23/2019  . Nicotine abuse 05/23/2019  . CVA (cerebral vascular accident) ([facility]) 05/21/2019    Belen Pesch, Annye Rusk MS, [place] 07/18/2019, 5:13 PM  [facility] [address] [place], [place], [address] Phone: [phone]   Fax:  [phone]   Name: Brandon Summers MRN: [id number] Date of Birth: [birth year]

## 2019-07-18 NOTE — Progress Notes (Signed)
Electrophysiology Office Note Date: 07/18/2019  ID:  Brandon Summers, DOB 11/24/1971, MRN [id number]  PCP: Patient, No Pcp Per Electrophysiologist: Allred  CC: event monitor follow up  Brandon Summers is a 48 y.o. male seen today for Dr Johney FrameAllred.  He presents today for routine electrophysiology followup.  Since last being seen in our clinic, the patient reports doing relatively well.  He is recovering from his stroke.  He denies chest pain, palpitations, dyspnea, PND, orthopnea, nausea, vomiting, dizziness, syncope, edema, weight gain, or early satiety.  Past Medical History:  Diagnosis Date  . Stroke (cerebrum) ([facility])   . Tobacco abuse    Past Surgical History:  Procedure Laterality Date  . BUBBLE STUDY  05/23/2019   Procedure: BUBBLE STUDY;  Surgeon: Chrystie NoseHilty, Kenneth C, MD;  [facility];  Service: Cardiovascular;;  . TEE WITHOUT CARDIOVERSION N/A 05/23/2019   Procedure: TRANSESOPHAGEAL ECHOCARDIOGRAM (TEE);  Surgeon: Chrystie NoseHilty, Kenneth C, MD;  [facility];  Service: Cardiovascular;  Laterality: N/A;    Current Outpatient Medications  Medication Sig Dispense Refill  . aspirin EC 81 MG tablet Take 1 tablet (81 mg total) by mouth daily. 90 tablet 3  . atorvastatin (LIPITOR) 40 MG tablet Take 1 tablet (40 mg total) by mouth daily at 6 PM. 90 tablet 1  . clopidogrel (PLAVIX) 75 MG tablet Take 1 tablet (75 mg total) by mouth daily. (Patient not taking: Reported on 06/25/2019) 21 tablet 0   No current facility-administered medications for this visit.     Allergies:   Patient has no known allergies.   Social History: Social History   Socioeconomic History  . Marital status: Married    Spouse name: Not on file  . Number of children: Not on file  . Years of education: Not on file  . Highest education level: Not on file  Occupational History  . Not on file  Social Needs  . Financial resource strain: Not on file  . Food insecurity    Worry: Not on file    Inability:  Not on file  . Transportation needs    Medical: Not on file    Non-medical: Not on file  Tobacco Use  . Smoking status: Current Every Day Smoker    Packs/day: 0.50    Types: Cigarettes  . Smokeless tobacco: Never Used  Substance and Sexual Activity  . Alcohol use: Yes    Comment: occasional  . Drug use: No  . Sexual activity: Not on file  Lifestyle  . Physical activity    Days per week: Not on file    Minutes per session: Not on file  . Stress: Not on file  Relationships  . Social Musicianconnections    Talks on phone: Not on file    Gets together: Not on file    Attends religious service: Not on file    Active member of club or organization: Not on file    Attends meetings of clubs or organizations: Not on file    Relationship status: Not on file  . Intimate partner violence    Fear of current or ex partner: Not on file    Emotionally abused: Not on file    Physically abused: Not on file    Forced sexual activity: Not on file  Other Topics Concern  . Not on file  Social History Narrative   Marital status: separated; moved from [place] 1998   Children: 2 children; no grandchildren.   Employment: Investment banker, operationalchef  Tobacco: 1/2ppd   Alcohol: none   Drugs: none   Exercise: none.    Family History: Family History  Problem Relation Age of Onset  . Hypertension Mother   . Stroke Mother     Review of Systems: All other systems reviewed and are otherwise negative except as noted above.   Physical Exam: VS:  There were no vitals taken for this visit. , BMI There is no height or weight on file to calculate BMI. Wt Readings from Last 3 Encounters:  06/07/19 166 lb (75.3 kg)  05/22/19 161 lb 2.5 oz (73.1 kg)  02/26/17 129 lb 12.8 oz (58.9 kg)    GEN- The patient is well appearing, alert and oriented x 3 today.   HEENT: normocephalic, atraumatic; sclera clear, conjunctiva pink; hearing intact; oropharynx clear; neck supple  Lungs- Clear to ausculation bilaterally, normal work  of breathing.  No wheezes, rales, rhonchi Heart- Regular rate and rhythm  GI- soft, non-tender, non-distended, bowel sounds present  Extremities- no clubbing, cyanosis, or edema  MS- no significant deformity or atrophy Skin- warm and dry, no rash or lesion  Psych- euthymic mood, full affect Neuro- strength and sensation are intact   EKG:  EKG is not ordered today.  Recent Labs: 05/21/2019: ALT 44; BUN 11; Creatinine, Ser 1.10; Potassium 4.3; Sodium 139 05/23/2019: Hemoglobin 17.3; Platelets 256    Other studies Reviewed: Additional studies/ records that were reviewed today include:hospital records  Assessment and Plan:  1.  Cryptogenic stroke Event monitor did not demonstrate atrial fibrillation At this time, would recommend ILR implant for long term monitoring.  Risks, benefits, remote monitoring reviewed with patient who wishes to proceed. Will schedule with Dr Rayann Heman in the office at the next available time.  I have asked him to bring his daughter with him to ILR implant procedure so we can review with her as well prior to implant.    Current medicines are reviewed at length with the patient today.   The patient does not have concerns regarding his medicines.  The following changes were made today:  none  Labs/ tests ordered today include: none No orders of the defined types were placed in this encounter.    Disposition:   Follow up with wound check after ILR implant      Signed, Chanetta Marshall, NP 07/18/2019 6:02 PM   [facility] [place] [address] [phone] (office) [phone] (fax)

## 2019-07-19 ENCOUNTER — Ambulatory Visit (INDEPENDENT_AMBULATORY_CARE_PROVIDER_SITE_OTHER): Payer: PRIVATE HEALTH INSURANCE | Admitting: Nurse Practitioner

## 2019-07-19 ENCOUNTER — Encounter: Payer: Self-pay | Admitting: Nurse Practitioner

## 2019-07-19 ENCOUNTER — Other Ambulatory Visit: Payer: Self-pay

## 2019-07-19 VITALS — BP 120/74 | HR 85 | Ht 73.0 in | Wt 162.0 lb

## 2019-07-19 DIAGNOSIS — E785 Hyperlipidemia, unspecified: Secondary | ICD-10-CM

## 2019-07-19 DIAGNOSIS — I639 Cerebral infarction, unspecified: Secondary | ICD-10-CM | POA: Diagnosis not present

## 2019-07-19 MED ORDER — ATORVASTATIN CALCIUM 40 MG PO TABS: 40.0000 mg | ORAL_TABLET | Freq: Every day | ORAL | 3 refills | Status: DC

## 2019-07-19 NOTE — Patient Instructions (Addendum)
Medication Instructions:  none If you need a refill on your cardiac medications before your next appointment, please call your pharmacy.   Lab work: none If you have labs (blood work) drawn today and your tests are completely normal, you will receive your results only by: Marland Kitchen MyChart Message (if you have MyChart) OR . A paper copy in the mail If you have any lab test that is abnormal or we need to change your treatment, we will call you to review the results.  Testing/Procedures: Loop Recorder (monitor) Insert by Dr Rayann Heman Friday 07/26/19 @ 4 pm..  Be here at 3:30 pm..Please Bring Daughter with you.   Follow-Up: At [facility], you and your health needs are our priority.  As part of our continuing mission to provide you with exceptional heart care, we have created designated Provider Care Teams.  These Care Teams include your primary Cardiologist (physician) and Advanced Practice Providers (APPs -  Physician Assistants and Nurse Practitioners) who all work together to provide you with the care you need, when you need it. .   Any Other Special Instructions Will Be Listed Below (If Applicable).

## 2019-07-22 ENCOUNTER — Encounter: Payer: Self-pay | Admitting: *Deleted

## 2019-07-22 NOTE — Progress Notes (Signed)
Patient ID: Brandon Summers, male   DOB: 05/30/1971, 48 y.o.   MRN: [id number] [employer] monitor returned to office.  Sent to [employer] via [place] 07/23/2019 using prepaid UPS packaging.

## 2019-07-23 ENCOUNTER — Other Ambulatory Visit: Payer: Self-pay

## 2019-07-23 ENCOUNTER — Ambulatory Visit: Payer: 59 | Admitting: Occupational Therapy

## 2019-07-23 ENCOUNTER — Ambulatory Visit: Payer: 59 | Admitting: Speech Pathology

## 2019-07-23 DIAGNOSIS — I69318 Other symptoms and signs involving cognitive functions following cerebral infarction: Secondary | ICD-10-CM

## 2019-07-23 DIAGNOSIS — R4701 Aphasia: Secondary | ICD-10-CM

## 2019-07-23 DIAGNOSIS — R41840 Attention and concentration deficit: Secondary | ICD-10-CM | POA: Diagnosis not present

## 2019-07-23 DIAGNOSIS — R41841 Cognitive communication deficit: Secondary | ICD-10-CM

## 2019-07-23 DIAGNOSIS — R29818 Other symptoms and signs involving the nervous system: Secondary | ICD-10-CM

## 2019-07-23 DIAGNOSIS — R471 Dysarthria and anarthria: Secondary | ICD-10-CM

## 2019-07-23 NOTE — Progress Notes (Signed)
NEUROLOGY CONSULTATION NOTE  Brandon Summers MRN: [id number] DOB: [birth year]  Referring provider: Mike Gip, FNP Primary care provider: Mike Gip, FNP  Reason for consult:  CVA  HISTORY OF PRESENT ILLNESS: Brandon Summers is a 48 year old right-handed black man, smoker, who presents for CVA.  History supplemented by hospital notes.  CT and MRI personally reviewed.  He was admitted to [facility] on 05/21/19 after presenting with blurred vision, headache and right sided numbness.  CT of head revealed no acute stroke.  MRI of brain, however, showed an acute large left thalamic infarct as well as significant chronic small vessel ischemic changes.  CTA of head and neck revealed no aneurysm or emergent large vessel stenosis or occlusion. 2D echo demonstrated EF 55-60% with no cardiac source of embolus.  TEE demonstrated no PFO or other cardiac source of embolus.  LDL was 114 and Hgb A1c was 5.7.  He was discharged on ASA 81mg  and Plavix 75mg  daily.  He was started on Lipitor 40mg  daily.  He was advised to stop smoking.  He still reports some blurred vision.  Right hand hurts but all extremities feel "tired".  He reports that he is drooling from both sides of his face.  He says face feels "different" but he denies numbness and tingling sensation.  He says when he talks, he tends to talk slowly because if he talks, he gets a left sided headache.  He has cut down on cigarettes, now smoking 1 pack over 4 days.  Seen by cardiology to have loop recorder insertion  PAST MEDICAL HISTORY: Past Medical History:  Diagnosis Date  . Stroke (cerebrum) ([facility])   . Tobacco abuse     PAST SURGICAL HISTORY: Past Surgical History:  Procedure Laterality Date  . BUBBLE STUDY  05/23/2019   Procedure: BUBBLE STUDY;  Surgeon: Chrystie Nose, MD;  Location: [facility] ENDOSCOPY;  Service: Cardiovascular;;  . TEE WITHOUT CARDIOVERSION N/A 05/23/2019   Procedure: TRANSESOPHAGEAL ECHOCARDIOGRAM (TEE);  Surgeon: Chrystie Nose, MD;  Location: [facility] ENDOSCOPY;  Service: Cardiovascular;  Laterality: N/A;    MEDICATIONS: Current Outpatient Medications on File Prior to Visit  Medication Sig Dispense Refill  . aspirin EC 81 MG tablet Take 1 tablet (81 mg total) by mouth daily. 90 tablet 3  . atorvastatin (LIPITOR) 40 MG tablet Take 1 tablet (40 mg total) by mouth daily at 6 PM. 90 tablet 3  . clopidogrel (PLAVIX) 75 MG tablet Take 1 tablet (75 mg total) by mouth daily. 21 tablet 0   No current facility-administered medications on file prior to visit.     ALLERGIES: No Known Allergies  FAMILY HISTORY: Family History  Problem Relation Age of Onset  . Hypertension Mother   . Stroke Mother    SOCIAL HISTORY: Social History   Socioeconomic History  . Marital status: Married    Spouse name: Not on file  . Number of children: Not on file  . Years of education: Not on file  . Highest education level: Not on file  Occupational History  . Not on file  Social Needs  . Financial resource strain: Not on file  . Food insecurity    Worry: Not on file    Inability: Not on file  . Transportation needs    Medical: Not on file    Non-medical: Not on file  Tobacco Use  . Smoking status: Current Every Day Smoker    Packs/day: 0.50    Types: Cigarettes  . Smokeless  tobacco: Never Used  Substance and Sexual Activity  . Alcohol use: Yes    Comment: occasional  . Drug use: No  . Sexual activity: Not on file  Lifestyle  . Physical activity    Days per week: Not on file    Minutes per session: Not on file  . Stress: Not on file  Relationships  . Social Musicianconnections    Talks on phone: Not on file    Gets together: Not on file    Attends religious service: Not on file    Active member of club or organization: Not on file    Attends meetings of clubs or organizations: Not on file    Relationship status: Not on file  . Intimate partner violence    Fear of current or ex partner: Not on file     Emotionally abused: Not on file    Physically abused: Not on file    Forced sexual activity: Not on file  Other Topics Concern  . Not on file  Social History Narrative   Marital status: separated; moved from [place] to [place] 1998   Children: 2 children; no grandchildren.   Employment: chef   Tobacco: 1/2ppd   Alcohol: none   Drugs: none   Exercise: none.    REVIEW OF SYSTEMS: Constitutional: No fevers, chills, or sweats, no generalized fatigue, change in appetite Eyes: No visual changes, double vision, eye pain Ear, nose and throat: No hearing loss, ear pain, nasal congestion, sore throat Cardiovascular: No chest pain, palpitations Respiratory:  No shortness of breath at rest or with exertion, wheezes GastrointestinaI: No nausea, vomiting, diarrhea, abdominal pain, fecal incontinence Genitourinary:  No dysuria, urinary retention or frequency Musculoskeletal:  No neck pain, back pain Integumentary: No rash, pruritus, skin lesions Neurological: as above Psychiatric: No depression, insomnia, anxiety Endocrine: No palpitations, fatigue, diaphoresis, mood swings, change in appetite, change in weight, increased thirst Hematologic/Lymphatic:  No purpura, petechiae. Allergic/Immunologic: no itchy/runny eyes, nasal congestion, recent allergic reactions, rashes  PHYSICAL EXAM: Blood pressure 120/82, pulse 72, temperature 98 F (36.7 C), height 6' (1.829 m), weight 162 lb (73.5 kg), SpO2 98 %. General: No acute distress.  Patient appears well-groomed.   Head:  Normocephalic/atraumatic Eyes:  fundi examined but not visualized Neck: supple, no paraspinal tenderness, full range of motion Back: No paraspinal tenderness Heart: regular rate and rhythm Lungs: Clear to auscultation bilaterally. Vascular: No carotid bruits. Neurological Exam: Mental status: alert and oriented to person, place, and time, recent and remote memory intact, fund of knowledge intact, attention and concentration intact,  speech fluent and not dysarthric, mild difficulty with expressing. Cranial nerves: CN I: not tested CN II: pupils equal, round and reactive to light, visual fields intact CN III, IV, VI:  full range of motion, no nystagmus, no ptosis CN V: Decreased right V2 CN VII: questionable trace right lower facial weakness. CN VIII: hearing intact CN IX, X: gag intact, uvula midline CN XI: sternocleidomastoid and trapezius muscles intact CN XII: tongue midline Bulk & Tone: normal, no fasciculations. Motor:  5/5 throughout Sensation:  Decreased left lower pinprick and right upper vibratory sensation Deep Tendon Reflexes:  2+ throughout, toes downgoing.   Finger to nose testing:  Without dysmetria.   Heel to shin:  Without dysmetria.   Gait:  Normal station and stride.  Able to turn and tandem walk (mildly unsteady). Romberg negative.  IMPRESSION: 1.  Left thalamic infarct, secondary to small vessel disease or embolic source. 2.  Hyperlipidemia 3.  Tobacco use disorder (1/2 ppd)   PLAN: 1.  May discontinue Plavix and continue ASA 81mg  daily alone for secondary stroke prevention 2.  Lipitor 40mg  daily (LDL goal less than 70) 3.  Tobacco cessation counseling (CPT 99406):  Tobacco withhistory of CAD, stroke, or cancer  - Currently smoking 1 pack every 4 days   - Patient was informed of the dangers of tobacco abuse including stroke, cancer, and MI, as well as benefits of tobacco cessation. - Patient is willing to quit at this time. - Approximately 4 mins were spent counseling patient cessation techniques. We discussed various methods to help quit smoking, including deciding on a date to quit, joining a support group, pharmacological agents- nicotine gum/patch/lozenges, chantix.  - I will reassess his progress at the next follow-up visit 4.  Continue PT/OT/speech therapy 5.  Plan for loop recorder. 6.  Follow up in 4 months  Thank you for allowing me to take part in the care of this patient.   [name], DO  CC: Lanae Boast, FNP  Decreased right V2, decreased right upper vib, decreased left lower pinprick

## 2019-07-23 NOTE — Therapy (Signed)
[place] [address] [place], [place], [address] Phone: [phone]   Fax:  [phone]  Occupational Therapy Treatment  Patient Details  Name: Brandon Summers MRN: [id number] Date of Birth: 08-Aug-1971 Referring Provider (OT): Lanae Boast, FNP  (PCP appt to establish care 06/07/19)   Encounter Date: 07/23/2019  OT End of Session - 07/23/19 1306    Visit Number  6    Number of Visits  9    Date for OT Re-Evaluation  07/29/19    Authorization Type  self pay    OT Start Time  1237    OT Stop Time  1315    OT Time Calculation (min)  38 min    Activity Tolerance  Patient tolerated treatment well    Behavior During Therapy  [facility] for tasks assessed/performed       Past Medical History:  Diagnosis Date  . Stroke (cerebrum) ([place])   . Tobacco abuse     Past Surgical History:  Procedure Laterality Date  . BUBBLE STUDY  05/23/2019   Procedure: BUBBLE STUDY;  Surgeon: Pixie Casino, MD;  Location: [place];  Service: Cardiovascular;;  . TEE WITHOUT CARDIOVERSION N/A 05/23/2019   Procedure: TRANSESOPHAGEAL ECHOCARDIOGRAM (TEE);  Surgeon: Pixie Casino, MD;  Location: [facility] ENDOSCOPY;  Service: Cardiovascular;  Laterality: N/A;    There were no vitals filed for this visit.  Subjective Assessment - 07/23/19 1303    Subjective   denies pain    Pertinent History  CVA 05/21/19; PMH:  HLD, nicotine abuse    Limitations  cognitive deficits, language deficits    Patient Stated Goals  return to work, independence    Currently in Pain?  No/denies    Pain Onset  In the past 7 days              Treatment: simple cooking task to make rice a  roni box mix.Pt required use of a magnifier to read fine print. Mod v.c to sequence task. Pt turned off stove without prompting. He was instructed not to cook at home without supervision Pipe tree design, min v.c to organize pieces then pt completed design correctly with only 1-2  v.c                OT Short Term Goals - 07/23/19 1305      OT SHORT TERM GOAL #1   Title  Pt/caregiver will verbalize understanding of cognitive compensation strategies for incr safety/independence with ADLs/IADLs.--check STGs 06/29/19    Baseline  dependent    Time  4    Period  Weeks    Status  On-going      OT SHORT TERM GOAL #2   Title  Pt will perform simple cooking task mod I.    Baseline  dependent/has not attempted    Time  4    Period  Weeks    Status  On-going   supervision, min-mod v.c     OT SHORT TERM GOAL #3   Title  Pt will perform simple home maintenance task mod I.    Baseline  dependent/has not attempted    Time  4    Period  Weeks    Status  On-going      OT SHORT TERM GOAL #4   Title  Pt will perform simple functional organization/problem solving tasks with at least 90% accuracy.    Time  4    Period  Weeks    Status  On-going  OT Long Term Goals - 05/30/19 1213      OT LONG TERM GOAL #1   Title  Pt will perform simple alternating-divided attention activity (at least 1 cognitive and 1 physical task) with at least 90% accuracy for incr safety/in prep for return to work.    Baseline  pt reports difficulty with attending to simple physical task    Time  8    Period  Weeks    Status  New      OT LONG TERM GOAL #2   Title  Pt will perform mod complex cooking task mod I.    Baseline  dependent/has not attempted    Time  8    Period  Weeks    Status  New      OT LONG TERM GOAL #3   Title  Pt will perform mod complex home maintenance task mod I.    Baseline  dependent/has not attempted    Time  8    Period  Weeks    Status  New      OT LONG TERM GOAL #4   Title  Pt will perform previous financial management tasks with supervision.    Baseline  needs assist    Time  8    Period  Weeks    Status  New            Plan - 07/23/19 1304    Clinical Impression Statement  Pt is progressing towards goals.He found use of a  magnifier helpful for reading instructions on rice a roni box.    OT Occupational Profile and History  Problem Focused Assessment - Including review of records relating to presenting problem    Occupational performance deficits (Please refer to evaluation for details):  ADL's;IADL's;Work;Leisure;Social Participation    Body Structure / Function / Physical Skills  ADL;Decreased knowledge of use of DME;Decreased knowledge of precautions    Cognitive Skills  Attention;Memory;Problem Solve;Safety Awareness;Thought;Understand    Rehab Potential  Good    Clinical Decision Making  Several treatment options, min-mod task modification necessary    Comorbidities Affecting Occupational Performance:  May have comorbidities impacting occupational performance    Modification or Assistance to Complete Evaluation   Min-Moderate modification of tasks or assist with assess necessary to complete eval    OT Frequency  2x / week   recommended, but 1x week at this time due to self pay/financial concerns at pt/family request   OT Duration  8 weeks    OT Treatment/Interventions  Self-care/ADL training;DME and/or AE instruction;Therapeutic activities;Cognitive remediation/compensation;Visual/perceptual remediation/compensation;Patient/family education    Plan  continue with functional cognition/ visual perceptual skills.    Consulted and Agree with Plan of Care  Patient;Family member/caregiver    Family Member Consulted  ex-wife/caregiver       Patient will benefit from skilled therapeutic intervention in order to improve the following deficits and impairments:   Body Structure / Function / Physical Skills: ADL, Decreased knowledge of use of DME, Decreased knowledge of precautions Cognitive Skills: Attention, Memory, Problem Solve, Safety Awareness, Thought, Understand     Visit Diagnosis: Other symptoms and signs involving cognitive functions following cerebral infarction  Attention and concentration  deficit  Other symptoms and signs involving the nervous system    Problem List Patient Active Problem List   Diagnosis Date Noted  . HLD (hyperlipidemia) 05/23/2019  . Nicotine abuse 05/23/2019  . CVA (cerebral vascular accident) (HCC) 05/21/2019    , 07/23/2019, 1:06 PM  [facility] [address] [place], [place], [address] Phone: [phone]   Fax:  [phone]  Name: Garn Connerly MRN: [id number] Date of Birth: Sep 28, 1971

## 2019-07-23 NOTE — Patient Instructions (Signed)
Practice SLOW, LOUD, BIG MOVEMENTS  Did you go to school today? Did I take my medicine? Will you help me fill my pill box? Can you buy some water? I need you to come to my doctor's appointment. What are you doing? Can you heat up the food for me? I'll be home in 10 minutes.  Brandon Summers Grad [facility] 24-Dec-1970

## 2019-07-23 NOTE — Therapy (Signed)
[place] [address] [place], [place], [address] Phone: [phone]   Fax:  [phone]  Speech Language Pathology Treatment  Patient Details  Name: Brandon Summers MRN: [id number] Date of Birth: [birth year] Referring Provider (SLP): Lanae Boast, FNP (PCP appt to establish care 06/07/19)   Encounter Date: 07/23/2019  End of Session - 07/23/19 1407    Visit Number  10    Number of Visits  17    Date for SLP Re-Evaluation  07/29/19    Authorization Type  self-pay; has applied for Medicaid    SLP Start Time  1316    SLP Stop Time   1400    SLP Time Calculation (min)  44 min    Activity Tolerance  Patient tolerated treatment well       Past Medical History:  Diagnosis Date  . Stroke (cerebrum) ([place])   . Tobacco abuse     Past Surgical History:  Procedure Laterality Date  . BUBBLE STUDY  05/23/2019   Procedure: BUBBLE STUDY;  Surgeon: Pixie Casino, MD;  Location: [place];  Service: Cardiovascular;;  . TEE WITHOUT CARDIOVERSION N/A 05/23/2019   Procedure: TRANSESOPHAGEAL ECHOCARDIOGRAM (TEE);  Surgeon: Pixie Casino, MD;  Location: [facility] ENDOSCOPY;  Service: Cardiovascular;  Laterality: N/A;    There were no vitals filed for this visit.  Subjective Assessment - 07/23/19 1405    Subjective  Pt without notebook today    Currently in Pain?  No/denies            ADULT SLP TREATMENT - 07/23/19 1405      General Information   Behavior/Cognition  Alert;Cooperative;Pleasant mood;Distractible      Treatment Provided   Treatment provided  Cognitive-Linquistic      Pain Assessment   Pain Assessment  No/denies pain      Cognitive-Linquistic Treatment   Treatment focused on  Aphasia;Cognition    Skilled Treatment  Pt without notebook today. Pt brought paperwork from MD appointment re: loop recorder insertion. Pt with questions re: his appointment; reviewed paperwork with occasional mod A from SLP and  demonstrated understanding of written instructions with extended time. Pt accepted call from Medicaid re: case determination. Pt required usual cues from SLP to increase volume when responding to simple questions. For more complex questions over the phone, pt required repetition, written and verbal cues from SLP for comprehension. SLP worked with pt to develop personally relevant words, sentences for pt to practice at home with increased volume. Pt read these phrases/sentences with occasional mod A for increased effort/intensity.      Assessment / Recommendations / Plan   Plan  Continue with current plan of care;Goals updated      Progression Toward Goals   Progression toward goals  Progressing toward goals       SLP Education - 07/23/19 1406    Education Details  compensations for decreased intelligibility/dysarthria, continue to use/bring notebook for memory/organization    Person(s) Educated  Patient    Methods  Explanation;Handout    Comprehension  Verbalized understanding;Returned demonstration;Need further instruction       SLP Short Term Goals - 07/23/19 1327      SLP SHORT TERM GOAL #1   Title  Pt will demo selective attention to therapy tasks for 10 minutes x2 sessions.    Baseline  06/11/19; 06/18/19    Time  1    Period  Weeks    Status  Achieved      SLP SHORT  TERM GOAL #2   Title  Pt will establish a memory compensation system and bring to 2 therapy sessions.    Baseline  06/27/19, 07/02/19    Time  1    Period  Weeks    Status  Achieved      SLP SHORT TERM GOAL #3   Title  Pt will verbally sequence steps in a functional task (eg recipes) with compensations for aphasia x3 sessions.    Time  1    Period  Weeks    Status  Deferred      SLP SHORT TERM GOAL #4   Title  Pt will demo auditory comprehension of mod complex multistep commands >85% accuracy x 3 visits.    Time  1    Period  Weeks    Status  Not Met      SLP SHORT TERM GOAL #5   Title  Pt will use  compensations for aphasia and dysarthria in 8 minutes simple conversation, x 2 visits with rare min A.    Time  1    Period  Weeks    Status  Deferred   for focus on cognition      SLP Long Term Goals - 07/23/19 1327      SLP LONG TERM GOAL #1   Title  Pt will demo alternating attention in a functional task for 15 minutes x2 sessions.    Time  2   or 17 sessions, for all LTGs   Period  Weeks    Status  On-going      SLP LONG TERM GOAL #2   Title  Pt will use memory compensation system to recall personal, schedule, or appointment information in 3 therapy sessions.    Baseline  07/09/19; 07/17/19;    Time  2    Period  Weeks    Status  On-going      SLP LONG TERM GOAL #3   Title  Pt will engage functionally in 5 minutes simple-mod complex conversation with compensations for aphasia, x2 visits.    Time  2    Period  Weeks    Status  Revised      SLP LONG TERM GOAL #4   Title  Pt will maintain adequate volume and vocal quality in 5 minutes simple conversation x2 visits.    Time  2    Period  Weeks    Status  Revised      SLP LONG TERM GOAL #5   Title  Pt will demo auditory comprehension of 10 minutes simple-mod complex conversation by answering questions or commenting appropriately x 2 visits.    Time  2    Period  Weeks    Status  On-going      SLP LONG TERM GOAL #6   Title  Pt will demo or eport success with phone calls (medical, disability services, personal) with use of compensations over 3 therapy sessions.    Baseline  07/17/19    Time  3    Period  Weeks    Status  On-going         Patient will benefit from skilled therapeutic intervention in order to improve the following deficits and impairments:   Aphasia  Dysarthria and anarthria  Cognitive communication deficit    Problem List Patient Active Problem List   Diagnosis Date Noted  . HLD (hyperlipidemia) 05/23/2019  . Nicotine abuse 05/23/2019  . CVA (cerebral vascular accident) ([place]) 05/21/2019    Olean Ree  Camie Patience, [place], [place] Speech-Language Pathologist   Aliene Altes 07/23/2019, 2:09 PM  [place] [address] [place], [place], [address] Phone: [phone]   Fax:  [phone]   Name: Brandon Summers MRN: [id number] Date of Birth: [birth year]

## 2019-07-24 ENCOUNTER — Encounter: Payer: Self-pay | Admitting: Neurology

## 2019-07-24 ENCOUNTER — Ambulatory Visit (INDEPENDENT_AMBULATORY_CARE_PROVIDER_SITE_OTHER): Payer: PRIVATE HEALTH INSURANCE | Admitting: Neurology

## 2019-07-24 ENCOUNTER — Other Ambulatory Visit: Payer: Self-pay | Admitting: *Deleted

## 2019-07-24 VITALS — BP 120/82 | HR 72 | Temp 98.0°F | Ht 72.0 in | Wt 162.0 lb

## 2019-07-24 DIAGNOSIS — I69320 Aphasia following cerebral infarction: Secondary | ICD-10-CM | POA: Diagnosis not present

## 2019-07-24 DIAGNOSIS — I639 Cerebral infarction, unspecified: Secondary | ICD-10-CM

## 2019-07-24 DIAGNOSIS — F17200 Nicotine dependence, unspecified, uncomplicated: Secondary | ICD-10-CM

## 2019-07-24 NOTE — Patient Instructions (Signed)
1.  Continue aspirin EC 81mg  daily 2.  Stop clopidogrel. 3.  Continue atorvastatin 40mg  daily.  Repeat lipid panel in 4 months (about a week prior to follow up) 4.  Continue physical therapy, occupational therapy and speech therapy 5.  For headache, take over the counter acetaminophen-caffeine (Excedrin Tension).  Limit use of pain relievers to no more than 2 days out of week to prevent risk of rebound or medication-overuse headache. 6.  Quit smoking 7.  Follow up in 4 months.

## 2019-07-26 ENCOUNTER — Other Ambulatory Visit: Payer: Self-pay

## 2019-07-26 ENCOUNTER — Ambulatory Visit: Payer: PRIVATE HEALTH INSURANCE | Admitting: Internal Medicine

## 2019-07-26 ENCOUNTER — Ambulatory Visit (INDEPENDENT_AMBULATORY_CARE_PROVIDER_SITE_OTHER): Payer: PRIVATE HEALTH INSURANCE | Admitting: Internal Medicine

## 2019-07-26 ENCOUNTER — Encounter: Payer: Self-pay | Admitting: Internal Medicine

## 2019-07-26 VITALS — BP 118/74 | HR 79 | Ht 72.0 in | Wt 163.0 lb

## 2019-07-26 DIAGNOSIS — I639 Cerebral infarction, unspecified: Secondary | ICD-10-CM | POA: Diagnosis not present

## 2019-07-26 HISTORY — PX: OTHER SURGICAL HISTORY: SHX169

## 2019-07-26 NOTE — Progress Notes (Signed)
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  Since last being seen in our clinic, Brandon Summers reports doing reasonably well. He continues to have R sided weakness and aphasia. Today, he denies symptoms of palpitations, chest pain, shortness of breath,  lower extremity edema, dizziness, presyncope, or syncope.  Brandon Summers is otherwise without complaint today.   Past Medical History:  Diagnosis Date  . Stroke (cerebrum) ([place])   . Tobacco abuse    Past Surgical History:  Procedure Laterality Date  . BUBBLE STUDY  05/23/2019   Procedure: BUBBLE STUDY;  Surgeon: Pixie Casino, MD;  Location: [place];  Service: Cardiovascular;;  . TEE WITHOUT CARDIOVERSION N/A 05/23/2019   Procedure: TRANSESOPHAGEAL ECHOCARDIOGRAM (TEE);  Surgeon: Pixie Casino, MD;  Location: [facility] ENDOSCOPY;  Service: Cardiovascular;  Laterality: N/A;    ROS- all systems are reviewed and negatives except as per HPI above  Current Outpatient Medications  Medication Sig Dispense Refill  . aspirin EC 81 MG tablet Take 1 tablet (81 mg total) by mouth daily. 90 tablet 3  . atorvastatin (LIPITOR) 40 MG tablet Take 1 tablet (40 mg total) by mouth daily at 6 PM. 90 tablet 3  . clopidogrel (PLAVIX) 75 MG tablet Take 1 tablet (75 mg total) by mouth daily. 21 tablet 0   No current facility-administered medications for this visit.     Physical Exam: Vitals:   07/26/19 1612  BP: 118/74  Pulse: 79  SpO2: 98%  Weight: 163 lb (73.9 kg)  Height: 6' (1.829 m)    GEN- Brandon Summers is well appearing, alert and oriented x 3 today.   Head- normocephalic, atraumatic Eyes-  Sclera clear, conjunctiva pink Ears- hearing intact Oropharynx- clear Lungs-   normal work of breathing GI- soft  Extremities- no clubbing, cyanosis, or edema  Wt Readings from Last 3 Encounters:  07/26/19 163 lb (73.9 kg)  07/24/19 162 lb (73.5 kg)  07/19/19  162 lb (73.5 kg)    Assessment and Plan:  Assessment and Plan:  1. Cryptogenic stroke Brandon Summers has a h/o recent cryptogenic stroke.   He has been monitored and did not have afib detected.  I spoke at length with Brandon Summers and daughter about monitoring for afib with an implantable loop recorder.  Risks, benefits, and alteratives to implantable loop recorder were discussed with Brandon Summers today.   At this time, Brandon Summers is very clear in their decision to proceed with implantable loop recorder.   Please call with questions.    Thompson Grayer MD, [facility] 07/26/2019 4:44 PM      DESCRIPTION OF PROCEDURE:  Informed written consent was obtained.  Brandon Summers required no sedation for Brandon procedure today.  Brandon patients left chest was prepped and draped. Mapping over Brandon Summers's chest was performed to identify Brandon appropriate ILR site.  This area was found to be Brandon left parasternal region over Brandon 3rd-4th intercostal space.  Brandon skin overlying this region was infiltrated with lidocaine for local analgesia.  A 0.5-cm incision was made at Brandon implant site.  A subcutaneous ILR pocket was fashioned using a combination of sharp and blunt dissection.  A Medtronic Reveal Linq model [id number] [id number]) implantable loop recorder was then placed into Brandon pocket R waves were very prominent and measured > 0.2 mV. EBL<1 ml.  Steri- Strips and a sterile dressing were then applied.  There were no early  apparent complications.     CONCLUSIONS:   1. Successful implantation of a Medtronic Reveal LINQ implantable loop recorder for cryptogenic stroke  2. No early apparent complications.   Hillis RangeJames Odesser Tourangeau MD, [facility] 07/26/2019 4:45 PM

## 2019-07-31 ENCOUNTER — Ambulatory Visit: Payer: PRIVATE HEALTH INSURANCE | Attending: Internal Medicine | Admitting: Occupational Therapy

## 2019-07-31 ENCOUNTER — Other Ambulatory Visit: Payer: Self-pay

## 2019-07-31 DIAGNOSIS — R41841 Cognitive communication deficit: Secondary | ICD-10-CM | POA: Diagnosis present

## 2019-07-31 DIAGNOSIS — R4701 Aphasia: Secondary | ICD-10-CM | POA: Diagnosis present

## 2019-07-31 DIAGNOSIS — R471 Dysarthria and anarthria: Secondary | ICD-10-CM | POA: Diagnosis present

## 2019-07-31 DIAGNOSIS — I69318 Other symptoms and signs involving cognitive functions following cerebral infarction: Secondary | ICD-10-CM

## 2019-07-31 DIAGNOSIS — R29818 Other symptoms and signs involving the nervous system: Secondary | ICD-10-CM

## 2019-07-31 DIAGNOSIS — R41840 Attention and concentration deficit: Secondary | ICD-10-CM

## 2019-07-31 NOTE — Therapy (Signed)
[place] [address] [place], [place], [address] Phone: [phone]   Fax:  [phone]  Occupational Therapy Treatment  Patient Details  Name: Brandon Summers MRN: [id number] Date of Birth: 06/13/1971 Referring Provider (OT): Lanae Boast, FNP  (PCP appt to establish care 06/07/19)   Encounter Date: 07/31/2019  OT End of Session - 07/31/19 1310    Visit Number  7    Number of Visits  9    Date for OT Re-Evaluation  07/29/19    Authorization Type  self pay    OT Start Time  1236    OT Stop Time  1315    OT Time Calculation (min)  39 min    Activity Tolerance  Patient tolerated treatment well    Behavior During Therapy  [facility] for tasks assessed/performed       Past Medical History:  Diagnosis Date  . Stroke (cerebrum) ([place])   . Tobacco abuse     Past Surgical History:  Procedure Laterality Date  . BUBBLE STUDY  05/23/2019   Procedure: BUBBLE STUDY;  Surgeon: Pixie Casino, MD;  Location: [facility] ENDOSCOPY;  Service: Cardiovascular;;  . implantable loop recorder placement  07/26/2019   MDT Reveal [id number] implanted by Dr Rayann Heman for cryptogenic stroke  . TEE WITHOUT CARDIOVERSION N/A 05/23/2019   Procedure: TRANSESOPHAGEAL ECHOCARDIOGRAM (TEE);  Surgeon: Pixie Casino, MD;  Location: [facility] ENDOSCOPY;  Service: Cardiovascular;  Laterality: N/A;    There were no vitals filed for this visit.  Subjective Assessment - 07/31/19 1309    Subjective   denies pain    Pertinent History  CVA 05/21/19; PMH:  HLD, nicotine abuse    Limitations  cognitive deficits, language deficits    Patient Stated Goals  return to work, independence    Currently in Pain?  No/denies    Pain Onset  In the past 7 days                 Treatment: Discussion with pt. Regarding activities he is performing at home.Pt reports he is not helping with home management or cooking. Note sent with pt requesting pt's dtr attends therapy, and  requesting that she allows him to assist with simple tasks at home. Sequencing task on constant therapy mod difficulty v.c , increased time required. Copying small peg design with RUE for increased coordination and visual perceptual skills, problem solving, min v.c            OT Short Term Goals - 07/31/19 1249      OT SHORT TERM GOAL #1   Title  Pt/caregiver will verbalize understanding of cognitive compensation strategies for incr safety/independence with ADLs/IADLs.--check STGs 06/29/19    Baseline  dependent    Time  4    Period  Weeks    Status  On-going      OT SHORT TERM GOAL #2   Title  Pt will perform simple cooking task mod I.    Baseline  dependent/has not attempted    Time  4    Period  Weeks    Status  On-going   supervision, min-mod v.c     OT SHORT TERM GOAL #3   Title  Pt will perform simple home maintenance task mod I.    Baseline  dependent/has not attempted    Time  4    Period  Weeks    Status  On-going      OT SHORT TERM GOAL #4   Title  Pt will perform simple functional organization/problem solving tasks with at least 90% accuracy.    Time  4    Period  Weeks    Status  On-going   grossly 75-80% today       OT Long Term Goals - 05/30/19 1213      OT LONG TERM GOAL #1   Title  Pt will perform simple alternating-divided attention activity (at least 1 cognitive and 1 physical task) with at least 90% accuracy for incr safety/in prep for return to work.    Baseline  pt reports difficulty with attending to simple physical task    Time  8    Period  Weeks    Status  New      OT LONG TERM GOAL #2   Title  Pt will perform mod complex cooking task mod I.    Baseline  dependent/has not attempted    Time  8    Period  Weeks    Status  New      OT LONG TERM GOAL #3   Title  Pt will perform mod complex home maintenance task mod I.    Baseline  dependent/has not attempted    Time  8    Period  Weeks    Status  New      OT LONG TERM GOAL #4    Title  Pt will perform previous financial management tasks with supervision.    Baseline  needs assist    Time  8    Period  Weeks    Status  New            Plan - 07/31/19 1311    Clinical Impression Statement  Pt is progressing slowly towards goals. He remains limited by visual impairment and cogntion. Note sent with pt. requesting his dtr attends  therapy    OT Occupational Profile and History  Problem Focused Assessment - Including review of records relating to presenting problem    Occupational performance deficits (Please refer to evaluation for details):  ADL's;IADL's;Work;Leisure;Social Participation    Body Structure / Function / Physical Skills  ADL;Decreased knowledge of use of DME;Decreased knowledge of precautions    Cognitive Skills  Attention;Memory;Problem Solve;Safety Awareness;Thought;Understand    Rehab Potential  Good    Clinical Decision Making  Several treatment options, min-mod task modification necessary    Comorbidities Affecting Occupational Performance:  May have comorbidities impacting occupational performance    Modification or Assistance to Complete Evaluation   Min-Moderate modification of tasks or assist with assess necessary to complete eval    OT Frequency  2x / week   recommended, but 1x week at this time due to self pay/financial concerns at pt/family request   OT Duration  8 weeks    OT Treatment/Interventions  Self-care/ADL training;DME and/or AE instruction;Therapeutic activities;Cognitive remediation/compensation;Visual/perceptual remediation/compensation;Patient/family education    Plan  continue with functional cognition/ visual perceptual skills.    Consulted and Agree with Plan of Care  Patient;Family member/caregiver    Family Member Consulted  ex-wife/caregiver       Patient will benefit from skilled therapeutic intervention in order to improve the following deficits and impairments:   Body Structure / Function / Physical Skills: ADL,  Decreased knowledge of use of DME, Decreased knowledge of precautions Cognitive Skills: Attention, Memory, Problem Solve, Safety Awareness, Thought, Understand     Visit Diagnosis: Other symptoms and signs involving cognitive functions following cerebral infarction  Attention and concentration deficit  Other symptoms and signs  involving the nervous system    Problem List Patient Active Problem List   Diagnosis Date Noted  . HLD (hyperlipidemia) 05/23/2019  . Nicotine abuse 05/23/2019  . CVA (cerebral vascular accident) (HCC) 05/21/2019    Tatum Massman 07/31/2019, 1:13 PM  [facility] [address] [place], [place], [address] Phone: [phone]   Fax:  [phone]  Name: Otilio Miumadou Rahn MRN: [id number] Date of Birth: 12/27/1970

## 2019-07-31 NOTE — Patient Instructions (Addendum)
Brandon Summers,  We are working with your father in therapy. Please allow him to attempt simple tasks such as laundry, or washing dishes with your supervision. This will help him to become more independent.  We have done some simple cooking in the clinic, he could assist you with simple cooking as long as you provide close supervision(stand right next to him and prompt him if he is making a mistake). He needs to be careful with heat sources or sharp objects particularly with his right hand as his sensation(feeling) is impaired. We would like for you to come in for a therapy session if possible so you can see what he is doing and we can educate you in how to help him.  Sincerely,  [name], OTR/L

## 2019-08-06 ENCOUNTER — Ambulatory Visit: Payer: PRIVATE HEALTH INSURANCE | Admitting: Occupational Therapy

## 2019-08-06 ENCOUNTER — Other Ambulatory Visit: Payer: Self-pay

## 2019-08-06 ENCOUNTER — Encounter: Payer: Self-pay | Admitting: Occupational Therapy

## 2019-08-06 DIAGNOSIS — R41840 Attention and concentration deficit: Secondary | ICD-10-CM

## 2019-08-06 DIAGNOSIS — I69318 Other symptoms and signs involving cognitive functions following cerebral infarction: Secondary | ICD-10-CM | POA: Diagnosis not present

## 2019-08-06 DIAGNOSIS — R29818 Other symptoms and signs involving the nervous system: Secondary | ICD-10-CM

## 2019-08-06 DIAGNOSIS — R4701 Aphasia: Secondary | ICD-10-CM

## 2019-08-06 NOTE — Therapy (Signed)
[facility] [address] [place], [place], [address] Phone: [phone]   Fax:  [phone]  Occupational Therapy Treatment  Patient Details  Name: Brandon Summers MRN: [id number] Date of Birth: 1971-07-01 Referring Provider (OT): Lanae Boast, FNP  (PCP appt to establish care 06/07/19)   Encounter Date: 08/06/2019  OT End of Session - 08/06/19 1253    Visit Number  7    Number of Visits  9    Date for OT Re-Evaluation  07/29/19    Authorization Type  self pay    OT Start Time  1235    OT Stop Time  1315    OT Time Calculation (min)  40 min    Activity Tolerance  Patient tolerated treatment well    Behavior During Therapy  [facility] for tasks assessed/performed       Past Medical History:  Diagnosis Date  . Stroke (cerebrum) ([place])   . Tobacco abuse     Past Surgical History:  Procedure Laterality Date  . BUBBLE STUDY  05/23/2019   Procedure: BUBBLE STUDY;  Surgeon: Pixie Casino, MD;  Location: [facility] ENDOSCOPY;  Service: Cardiovascular;;  . implantable loop recorder placement  07/26/2019   MDT Reveal [id number] implanted by Dr Rayann Heman for cryptogenic stroke  . TEE WITHOUT CARDIOVERSION N/A 05/23/2019   Procedure: TRANSESOPHAGEAL ECHOCARDIOGRAM (TEE);  Surgeon: Pixie Casino, MD;  Location: [facility] ENDOSCOPY;  Service: Cardiovascular;  Laterality: N/A;    There were no vitals filed for this visit.  Subjective Assessment - 08/06/19 1236    Subjective   "I fixed myself food (rice)"  Pt reports difficult relationship with dtr that lives with him.    Pertinent History  CVA 05/21/19; PMH:  HLD, nicotine abuse    Limitations  cognitive deficits, language deficits    Patient Stated Goals  return to work, independence    Currently in Pain?  No/denies    Pain Onset  In the past 7 days        Attempted simple, familiar meal planning and grocery list of pt's choice--pt needed mod cueing and incr time.  Pt educated in lifestyle  changes recommended to decr risk of stroke and issued handout.  Pt verbalized understanding.  Discussed barriers to progress and incr IADL performance.           OT Education - 08/06/19 1458    Education Details  Recommended that pt ask other children to cook with him (and teach them how to cook the dishes that they like)    Person(s) Educated  Patient    Methods  Explanation    Comprehension  Verbalized understanding       OT Short Term Goals - 07/31/19 1249      OT SHORT TERM GOAL #1   Title  Pt/caregiver will verbalize understanding of cognitive compensation strategies for incr safety/independence with ADLs/IADLs.--check STGs 06/29/19    Baseline  dependent    Time  4    Period  Weeks    Status  On-going      OT SHORT TERM GOAL #2   Title  Pt will perform simple cooking task mod I.    Baseline  dependent/has not attempted    Time  4    Period  Weeks    Status  On-going   supervision, min-mod v.c     OT SHORT TERM GOAL #3   Title  Pt will perform simple home maintenance task mod I.    Baseline  dependent/has not attempted    Time  4    Period  Weeks    Status  On-going      OT SHORT TERM GOAL #4   Title  Pt will perform simple functional organization/problem solving tasks with at least 90% accuracy.    Time  4    Period  Weeks    Status  On-going   grossly 75-80% today       OT Long Term Goals - 05/30/19 1213      OT LONG TERM GOAL #1   Title  Pt will perform simple alternating-divided attention activity (at least 1 cognitive and 1 physical task) with at least 90% accuracy for incr safety/in prep for return to work.    Baseline  pt reports difficulty with attending to simple physical task    Time  8    Period  Weeks    Status  New      OT LONG TERM GOAL #2   Title  Pt will perform mod complex cooking task mod I.    Baseline  dependent/has not attempted    Time  8    Period  Weeks    Status  New      OT LONG TERM GOAL #3   Title  Pt will perform  mod complex home maintenance task mod I.    Baseline  dependent/has not attempted    Time  8    Period  Weeks    Status  New      OT LONG TERM GOAL #4   Title  Pt will perform previous financial management tasks with supervision.    Baseline  needs assist    Time  8    Period  Weeks    Status  New            Plan - 08/06/19 1500    Clinical Impression Statement  Dtr did not attend therapy today, and pt describes difficult relationship with dtr who lives with him.  Recommended pt cook with other children.  Cogntive/language deficits and decr carryover/support are barriers.    OT Occupational Profile and History  Problem Focused Assessment - Including review of records relating to presenting problem    Occupational performance deficits (Please refer to evaluation for details):  ADL's;IADL's;Work;Leisure;Social Participation    Body Structure / Function / Physical Skills  ADL;Decreased knowledge of use of DME;Decreased knowledge of precautions    Cognitive Skills  Attention;Memory;Problem Solve;Safety Awareness;Thought;Understand    Rehab Potential  Good    Clinical Decision Making  Several treatment options, min-mod task modification necessary    Comorbidities Affecting Occupational Performance:  May have comorbidities impacting occupational performance    Modification or Assistance to Complete Evaluation   Min-Moderate modification of tasks or assist with assess necessary to complete eval    OT Frequency  2x / week   recommended, but 1x week at this time due to self pay/financial concerns at pt/family request   OT Duration  8 weeks    OT Treatment/Interventions  Self-care/ADL training;DME and/or AE instruction;Therapeutic activities;Cognitive remediation/compensation;Visual/perceptual remediation/compensation;Patient/family education    Plan  continue with functional cognition/ visual perceptual skills    Consulted and Agree with Plan of Care  Patient;Family member/caregiver     Family Member Consulted  ex-wife/caregiver       Patient will benefit from skilled therapeutic intervention in order to improve the following deficits and impairments:   Body Structure / Function / Physical Skills: ADL, Decreased knowledge of  use of DME, Decreased knowledge of precautions Cognitive Skills: Attention, Memory, Problem Solve, Safety Awareness, Thought, Understand     Visit Diagnosis: Other symptoms and signs involving cognitive functions following cerebral infarction  Attention and concentration deficit  Other symptoms and signs involving the nervous system  Aphasia    Problem List Patient Active Problem List   Diagnosis Date Noted  . HLD (hyperlipidemia) 05/23/2019  . Nicotine abuse 05/23/2019  . CVA (cerebral vascular accident) [facility]) 05/21/2019    [facility] 08/06/2019, 3:03 PM  [facility] [address] [place], [place], [address] Phone: [phone]   Fax:  [phone]  Name: Brandon Summers MRN: [id number] Date of Birth: [birth year]   Willa Frater, OTR/L [facility] [address] [place], [place]  [address] [phone] phone [phone] 08/06/19 3:03 PM

## 2019-08-07 ENCOUNTER — Encounter (HOSPITAL_COMMUNITY): Payer: Self-pay | Admitting: *Deleted

## 2019-08-07 ENCOUNTER — Encounter (HOSPITAL_COMMUNITY): Payer: Self-pay

## 2019-08-08 ENCOUNTER — Other Ambulatory Visit: Payer: Self-pay

## 2019-08-08 ENCOUNTER — Ambulatory Visit (INDEPENDENT_AMBULATORY_CARE_PROVIDER_SITE_OTHER): Payer: PRIVATE HEALTH INSURANCE | Admitting: Student

## 2019-08-08 DIAGNOSIS — I639 Cerebral infarction, unspecified: Secondary | ICD-10-CM

## 2019-08-08 NOTE — Progress Notes (Signed)
ILR wound check in clinic. Steri strips removed. Wound well healed. App does not work from patients phone. Awaiting carelink monitor, shipped 08/06/2019. No episodes. Questions answered. R waves 0.33 mV

## 2019-08-12 ENCOUNTER — Ambulatory Visit (INDEPENDENT_AMBULATORY_CARE_PROVIDER_SITE_OTHER): Payer: PRIVATE HEALTH INSURANCE | Admitting: Internal Medicine

## 2019-08-12 ENCOUNTER — Encounter: Payer: Self-pay | Admitting: Internal Medicine

## 2019-08-12 ENCOUNTER — Other Ambulatory Visit: Payer: Self-pay

## 2019-08-12 DIAGNOSIS — R942 Abnormal results of pulmonary function studies: Secondary | ICD-10-CM | POA: Diagnosis not present

## 2019-08-12 NOTE — Patient Instructions (Addendum)
No need for pulmonary follow up - remaining off cigarettes is the best way to preserve your lung function   Pulmonary follow up is as needed for difficulty breathing or a cough that won't go away

## 2019-08-12 NOTE — Progress Notes (Signed)
Brandon Summers, male    DOB: 01/27/1971     MRN: [id number]   Brief patient profile:  48 yobm from [place] quit smoking in June 2020  p L Hem CVA/ R hemiparesis /paresthesias     History of Present Illness  08/12/2019  Pulmonary/ 1st office eval/Tywanna Seifer  Chief Complaint  Patient presents with   Consult    Referred by neurology for abnormal PFT and stroke back in June. Patient denied any breathing problems.   Dyspnea:  Not limited by breathing from desired activities   Cough: none  Sleep:able to lie flat ok  SABA use: none   No obvious day to day or daytime variability or assoc excess/ purulent sputum or mucus plugs or hemoptysis or cp or chest tightness, subjective wheeze or overt sinus or hb symptoms.   Sleeping  without nocturnal  or early am exacerbation  of respiratory  c/o's or need for noct saba. Also denies any obvious fluctuation of symptoms with weather or environmental changes or other aggravating or alleviating factors except as outlined above   No unusual exposure hx or h/o childhood pna/ asthma or knowledge of premature birth.  Current Allergies, Complete Past Medical History, Past Surgical History, Family History, and Social History were reviewed in [facility] electronic medical record.  ROS  The following are not active complaints unless bolded Hoarseness, sore throat, dysphagia, dental problems, itching, sneezing,  nasal congestion or discharge of excess mucus or purulent secretions, ear ache,   fever, chills, sweats, unintended wt loss or wt gain, classically pleuritic or exertional cp,  orthopnea pnd or arm/hand swelling  or leg swelling, presyncope, palpitations, abdominal pain, anorexia, nausea, vomiting, diarrhea  or change in bowel habits or change in bladder habits, change in stools or change in urine, dysuria, hematuria,  rash, arthralgias, visual complaints, headache, numbness, weakness or ataxia or problems with walking or coordination,  change in mood or   memory.             Past Medical History:  Diagnosis Date   Stroke (cerebrum) ([facility])    Tobacco abuse     Outpatient Medications Prior to Visit  Medication Sig Dispense Refill   aspirin EC 81 MG tablet Take 1 tablet (81 mg total) by mouth daily. 90 tablet 3   atorvastatin (LIPITOR) 40 MG tablet Take 1 tablet (40 mg total) by mouth daily at 6 PM. 90 tablet 3   clopidogrel (PLAVIX) 75 MG tablet Take 1 tablet (75 mg total) by mouth daily. 21 tablet 0      Objective:     BP 120/70    Pulse 71    Temp (!) 97.4 F (36.3 C) (Temporal)    Ht 6' (1.829 m)    Wt 160 lb 6.4 oz (72.8 kg)    SpO2 100%    BMI 21.75 kg/m   SpO2: 100 %   Pleasant amb bm, Speaks English ok but french is native language   HEENT : pt wearing mask not removed for exam due to covid - 19 concerns.   NECK :  without JVD/Nodes/TM/ nl carotid upstrokes bilaterally   LUNGS: no acc muscle use,  Nl contour chest which is clear to A and P bilaterally without cough on insp or exp maneuvers   CV:  RRR  no s3 or murmur or increase in P2, and no edema   ABD:  soft and nontender with nl inspiratory excursion in the supine position. No bruits or organomegaly appreciated, bowel sounds  nl  MS:  Nl gait/ ext warm without deformities, calf tenderness, cyanosis or clubbing No obvious joint restrictions   SKIN: warm and dry without lesions    NEURO:  alert, approp, nl sensorium with  no motor or cerebellar deficits apparent.      I personally reviewed images and agree with radiology impression as follows:  CXR:   05/22/2019 No active cardiopulmonary disease.     Assessment   Abnormal PFT Quit smoking 05/21/19  - 08/12/2019   Walked RA  2 laps @  approx 290ft each @ fast pace  stopped due to  End of study, no sob, sats 100%  So no indication for pfts    I reviewed the Fletcher curve with the patient that basically indicates  if you quit smoking when your best day FEV1 is still well preserved (as is clearly   the case here)  it is highly unlikely you will progress to severe disease and informed the patient there was  no medication on the market that has proven to alter the curve/ its downward trajectory  or the likelihood of progression of their disease(unlike other chronic medical conditions such as atheroclerosis where we do think we can change the natural hx with risk reducing meds)    Therefore stopping smoking and maintaining abstinence are  the most important aspects of care, not choice of inhalers or for that matter, doctors.   Treatment other than smoking cessation  is entirely directed by severity of symptoms and focused also on reducing exacerbations, not attempting to change the natural history of the disease.    Since not having limiting doe or tendency to aecopd, pulmonary f/u can be prn    Total time devoted to counseling  > 50 % of initial 78min office visit:  reviewed case with pt/  directly observed portions of ambulatory 02 saturation study/ discussion of options/alternatives/ personally creating written customized instructions  in presence of pt  then going over those specific  Instructions directly with the pt including how to use all of the meds but in particular covering each new medication in detail and the difference between the maintenance= "automatic" meds and the prns using an action plan format for the latter (If this problem/symptom => do that organization reading Left to right).  Please see AVS from this visit for a full list of these instructions which I personally wrote for this pt and  are unique to this visit.      Christinia Gully, MD 08/12/2019

## 2019-08-13 ENCOUNTER — Ambulatory Visit: Payer: PRIVATE HEALTH INSURANCE | Admitting: Occupational Therapy

## 2019-08-13 ENCOUNTER — Encounter: Payer: Self-pay | Admitting: Internal Medicine

## 2019-08-13 ENCOUNTER — Encounter: Payer: Self-pay | Admitting: Occupational Therapy

## 2019-08-13 DIAGNOSIS — R41840 Attention and concentration deficit: Secondary | ICD-10-CM

## 2019-08-13 DIAGNOSIS — I69318 Other symptoms and signs involving cognitive functions following cerebral infarction: Secondary | ICD-10-CM | POA: Diagnosis not present

## 2019-08-13 DIAGNOSIS — R29818 Other symptoms and signs involving the nervous system: Secondary | ICD-10-CM

## 2019-08-13 NOTE — Assessment & Plan Note (Addendum)
Quit smoking 05/21/19  - 08/12/2019   Walked RA  2 laps @  approx 240ft each @ fast pace  stopped due to  End of study, no sob, sats 100%  So no indication for pfts    I reviewed the Fletcher curve with the patient that basically indicates  if you quit smoking when your best day FEV1 is still well preserved (as is clearly  the case here)  it is highly unlikely you will progress to severe disease and informed the patient there was  no medication on the market that has proven to alter the curve/ its downward trajectory  or the likelihood of progression of their disease(unlike other chronic medical conditions such as atheroclerosis where we do think we can change the natural hx with risk reducing meds)    Therefore stopping smoking and maintaining abstinence are  the most important aspects of care, not choice of inhalers or for that matter, doctors.   Treatment other than smoking cessation  is entirely directed by severity of symptoms and focused also on reducing exacerbations, not attempting to change the natural history of the disease.    Since not having limiting doe or tendency to aecopd, pulmonary f/u can be prn    Total time devoted to counseling  > 50 % of initial 48min office visit:  reviewed case with pt/  directly observed portions of ambulatory 02 saturation study/ discussion of options/alternatives/ personally creating written customized instructions  in presence of pt  then going over those specific  Instructions directly with the pt including how to use all of the meds but in particular covering each new medication in detail and the difference between the maintenance= "automatic" meds and the prns using an action plan format for the latter (If this problem/symptom => do that organization reading Left to right).  Please see AVS from this visit for a full list of these instructions which I personally wrote for this pt and  are unique to this visit.

## 2019-08-13 NOTE — Therapy (Signed)
[facility] [address] [place], [place], [address] Phone: [phone]   Fax:  [phone]  Occupational Therapy Treatment  Patient Details  Name: Brandon Summers MRN: [id number] Date of Birth: 04/30/1971 Referring Provider (OT): Lanae Boast, FNP  (PCP appt to establish care 06/07/19)   Encounter Date: 08/13/2019  OT End of Session - 08/13/19 1239    Visit Number  8    Number of Visits  9    Date for OT Re-Evaluation  07/29/19    Authorization Type  self pay    OT Start Time  1235    OT Stop Time  1315    OT Time Calculation (min)  40 min    Activity Tolerance  Patient tolerated treatment well    Behavior During Therapy  [facility] for tasks assessed/performed       Past Medical History:  Diagnosis Date  . Stroke (cerebrum) ([place])   . Tobacco abuse     Past Surgical History:  Procedure Laterality Date  . BUBBLE STUDY  05/23/2019   Procedure: BUBBLE STUDY;  Surgeon: Pixie Casino, MD;  Location: [facility] ENDOSCOPY;  Service: Cardiovascular;;  . implantable loop recorder placement  07/26/2019   MDT Reveal [id number] implanted by Dr Rayann Heman for cryptogenic stroke  . TEE WITHOUT CARDIOVERSION N/A 05/23/2019   Procedure: TRANSESOPHAGEAL ECHOCARDIOGRAM (TEE);  Surgeon: Pixie Casino, MD;  Location: [facility] ENDOSCOPY;  Service: Cardiovascular;  Laterality: N/A;    There were no vitals filed for this visit.  Subjective Assessment - 08/13/19 1234    Subjective   Pt reports that dtr doesn't want to watch him cook (dtr that lives with him).  Dtr won't let him wash clothes    Pertinent History  CVA 05/21/19; PMH:  HLD, nicotine abuse    Limitations  cognitive deficits, language deficits    Patient Stated Goals  return to work, independence    Currently in Pain?  No/denies    Pain Onset  In the past 7 days        Environmental scanning in minimally distracting environment while carrying plate in RUE and removing cards with LUE.  Pt  completed with 100% accuracy.  Placing Perfection Pieces in board for organization/visual scanning with good accuracy with incr time.  Sweeping ADL kitchen with good safety/accuracy and balance.  Encouraged pt to do this at home.  Functional organization/problem solving to sort shopping items into categories (on cards).  Pt needed min-mod cueing for accuracy/organization.           OT Short Term Goals - 08/13/19 1301      OT SHORT TERM GOAL #1   Title  Pt/caregiver will verbalize understanding of cognitive compensation strategies for incr safety/independence with ADLs/IADLs.--check STGs 06/29/19    Baseline  dependent    Time  4    Period  Weeks    Status  Deferred   Issued/addressed by ST     OT SHORT TERM GOAL #2   Title  Pt will perform simple cooking task mod I.    Baseline  dependent/has not attempted    Time  4    Period  Weeks    Status  On-going   supervision, min-mod v.c     OT SHORT TERM GOAL #3   Title  Pt will perform simple home maintenance task mod I.    Baseline  dependent/has not attempted    Time  4    Period  Weeks    Status  Partially Met   08/13/19:   pt able to perform safely in clinic, but is not performing at home due to home situation per pt report     OT SHORT TERM GOAL #4   Title  Pt will perform simple functional organization/problem solving tasks with at least 90% accuracy.    Time  4    Period  Weeks    Status  On-going   grossly 75-80% today, 08/13/19:  approx 75-80% accuracy       OT Long Term Goals - 08/13/19 1254      OT LONG TERM GOAL #1   Title  Pt will perform simple alternating-divided attention activity (at least 1 cognitive and 1 physical task) with at least 90% accuracy for incr safety/in prep for return to work.    Baseline  pt reports difficulty with attending to simple physical task    Time  8    Period  Weeks    Status  New      OT LONG TERM GOAL #2   Title  Pt will perform mod complex cooking task mod I.     Baseline  dependent/has not attempted    Time  8    Period  Weeks    Status  New      OT LONG TERM GOAL #3   Title  Pt will perform mod complex home maintenance task mod I.    Baseline  dependent/has not attempted    Time  8    Period  Weeks    Status  New      OT LONG TERM GOAL #4   Title  Pt will perform previous financial management tasks with supervision.    Baseline  needs assist    Time  8    Period  Weeks    Status  New            Plan - 08/13/19 1240    Clinical Impression Statement  Pt demo ability to sweep with good safety/accuracy, but continues to report inability to perform cooking/home maintenance tasks at home due to dtr not supervising/allowing which impacts progress/carryover.    OT Occupational Profile and History  Problem Focused Assessment - Including review of records relating to presenting problem    Occupational performance deficits (Please refer to evaluation for details):  ADL's;IADL's;Work;Leisure;Social Participation    Body Structure / Function / Physical Skills  ADL;Decreased knowledge of use of DME;Decreased knowledge of precautions    Cognitive Skills  Attention;Memory;Problem Solve;Safety Awareness;Thought;Understand    Rehab Potential  Good    Clinical Decision Making  Several treatment options, min-mod task modification necessary    Comorbidities Affecting Occupational Performance:  May have comorbidities impacting occupational performance    Modification or Assistance to Complete Evaluation   Min-Moderate modification of tasks or assist with assess necessary to complete eval    OT Frequency  2x / week   recommended, but 1x week at this time due to self pay/financial concerns at pt/family request   OT Duration  8 weeks    OT Treatment/Interventions  Self-care/ADL training;DME and/or AE instruction;Therapeutic activities;Cognitive remediation/compensation;Visual/perceptual remediation/compensation;Patient/family education    Plan  continue with  functional cognition, cooking task    Consulted and Agree with Plan of Care  Patient;Family member/caregiver    Family Member Consulted  ex-wife/caregiver       Patient will benefit from skilled therapeutic intervention in order to improve the following deficits and impairments:   Body Structure / Function / Physical  Skills: ADL, Decreased knowledge of use of DME, Decreased knowledge of precautions Cognitive Skills: Attention, Memory, Problem Solve, Safety Awareness, Thought, Understand     Visit Diagnosis: Other symptoms and signs involving cognitive functions following cerebral infarction  Attention and concentration deficit  Other symptoms and signs involving the nervous system    Problem List Patient Active Problem List   Diagnosis Date Noted  . Abnormal PFT 08/12/2019  . HLD (hyperlipidemia) 05/23/2019  . CVA (cerebral vascular accident) [facility]) 05/21/2019    [facility] 08/13/2019, 3:36 PM  [facility] [address] [place], [place], [address] Phone: [phone]   Fax:  [phone]  Name: Bernerd Terhune MRN: [id number] Date of Birth: 14-Nov-1971   Vianne Bulls, OTR/L [facility] [address] [place],   [address] [phone] phone [phone] 08/13/19 3:36 PM

## 2019-08-15 ENCOUNTER — Other Ambulatory Visit: Payer: Self-pay

## 2019-08-15 ENCOUNTER — Ambulatory Visit: Payer: PRIVATE HEALTH INSURANCE | Admitting: Speech Pathology

## 2019-08-15 DIAGNOSIS — I69318 Other symptoms and signs involving cognitive functions following cerebral infarction: Secondary | ICD-10-CM | POA: Diagnosis not present

## 2019-08-15 DIAGNOSIS — R471 Dysarthria and anarthria: Secondary | ICD-10-CM

## 2019-08-15 DIAGNOSIS — R4701 Aphasia: Secondary | ICD-10-CM

## 2019-08-15 DIAGNOSIS — R41841 Cognitive communication deficit: Secondary | ICD-10-CM

## 2019-08-15 NOTE — Patient Instructions (Addendum)
Practice your louder speech at home.   Big breath, then say out LOUD.  1. Count 1-20 2. Days of the week 3. Months of the year  Did you go to school today? Did I take my medicine? Will you help me fill my pill box? Can you buy some water? I need you to come to my doctor's appointment. What are you doing? Can you heat up the food for me? I'll be home in 10 minutes.  Kadidja Amyah Aisha Tina Yacine Chubbuck 1971/05/11      Otila Kluver,  If Taeden has been approved from [place], please make sure our office has a copy of his Medicaid card. Once we have the information, we will have to submit for approval to see if they will pay for some of his therapy sessions.  Thank you, Deneise Lever, Speech Therapist Larena [email or web address]

## 2019-08-15 NOTE — Therapy (Signed)
[facility] [address] [place], [place], [address] Phone: [phone]   Fax:  [phone]  Speech Language Pathology Treatment  Patient Details  Name: Brandon Summers MRN: [id number] Date of Birth: 08-Jan-1971 Referring Provider (SLP): Lanae Boast, FNP (PCP appt to establish care 06/07/19)   Encounter Date: 08/15/2019  End of Session - 08/15/19 0902    Visit Number  11    Number of Visits  19    Date for SLP Re-Evaluation  10/14/19    Authorization Type  self-pay; has applied for Medicaid    SLP Start Time  0847    SLP Stop Time   0930    SLP Time Calculation (min)  43 min    Activity Tolerance  Patient tolerated treatment well       Past Medical History:  Diagnosis Date  . Stroke (cerebrum) ([place])   . Tobacco abuse     Past Surgical History:  Procedure Laterality Date  . BUBBLE STUDY  05/23/2019   Procedure: BUBBLE STUDY;  Surgeon: Pixie Casino, MD;  Location: [facility] ENDOSCOPY;  Service: Cardiovascular;;  . implantable loop recorder placement  07/26/2019   MDT Reveal [id number] implanted by Dr Rayann Heman for cryptogenic stroke  . TEE WITHOUT CARDIOVERSION N/A 05/23/2019   Procedure: TRANSESOPHAGEAL ECHOCARDIOGRAM (TEE);  Surgeon: Pixie Casino, MD;  Location: [facility] ENDOSCOPY;  Service: Cardiovascular;  Laterality: N/A;    There were no vitals filed for this visit.  Subjective Assessment - 08/15/19 0902    Subjective  "It's in the car," re: notebook    Currently in Pain?  No/denies            ADULT SLP TREATMENT - 08/15/19 1037      General Information   Behavior/Cognition  Alert;Cooperative;Pleasant mood;Distractible      Treatment Provided   Treatment provided  Cognitive-Linquistic      Pain Assessment   Pain Assessment  No/denies pain      Cognitive-Linquistic Treatment   Treatment focused on  Aphasia;Dysarthria    Skilled Treatment  Patient returned for first ST session in several weeks; apparently  did not schedule after last ST visit. In mod complex conversation, SLP used written keywords/visuals to support conversation with patient regarding insurance information. Pt unsure but thinks he has been approved for Medicaid. Note sent home with patient to provide this information for our office if this is the case. SLP worked with pt today on speech intelligibility. He continues to complain of getting a headache when speaking louder, however he was able to obtain excellent intelligibility in structured/automatic tasks today when using increased intensity, ultimately with rare min cues. He did not report a headache during this activity. Carryover to simple conversation (5 minutes) with occasional min-mod A. Pt without significant aphasia or anomia during simple conversation re: familiar topics (his home country of [place], his native tongue ([place]), his children.       Assessment / Recommendations / Plan   Plan  Continue with current plan of care;Goals updated      Progression Toward Goals   Progression toward goals  Progressing toward goals       SLP Education - 08/15/19 1047    Education Details  practice louder speech, 3x per day. have a "loud" conversation at least 1x per day    Person(s) Educated  Patient    Methods  Explanation;Handout    Comprehension  Verbalized understanding;Need further instruction       SLP Short Term Goals -  08/15/19 0904      SLP SHORT TERM GOAL #1   Title  Pt will demo selective attention to therapy tasks for 10 minutes x2 sessions.    Baseline  06/11/19; 06/18/19    Time  1    Period  Weeks    Status  Achieved      SLP SHORT TERM GOAL #2   Title  Pt will establish a memory compensation system and bring to 2 therapy sessions.    Baseline  06/27/19, 07/02/19    Time  1    Period  Weeks    Status  Achieved      SLP SHORT TERM GOAL #3   Title  Pt will verbally sequence steps in a functional task (eg recipes) with compensations for aphasia x3 sessions.     Time  1    Period  Weeks    Status  Deferred      SLP SHORT TERM GOAL #4   Title  Pt will demo auditory comprehension of mod complex multistep commands >85% accuracy x 3 visits.    Time  1    Period  Weeks    Status  Not Met      SLP SHORT TERM GOAL #5   Title  Pt will use compensations for aphasia and dysarthria in 8 minutes simple conversation, x 2 visits with rare min A.    Time  1    Period  Weeks    Status  Deferred   for focus on cognition      SLP Long Term Goals - 08/15/19 0904      SLP LONG TERM GOAL #1   Title  Pt will demo alternating attention in a functional task for 15 minutes x2 sessions.    Time  2   or 17 sessions, for all LTGs   Period  Weeks    Status  Deferred      SLP LONG TERM GOAL #2   Title  Pt will use memory compensation system to recall personal, schedule, or appointment information in 3 therapy sessions.    Baseline  07/09/19; 07/17/19;    Time  4    Period  Weeks    Status  Revised   renewed for 4 weeks     SLP LONG TERM GOAL #3   Title  Pt will engage functionally in 5 minutes simple-mod complex conversation with compensations for aphasia, x2 visits.    Time  4    Period  Weeks    Status  Revised   renewed for 4 weeks     SLP LONG TERM GOAL #4   Title  Pt will maintain adequate volume and vocal quality in 5 minutes simple conversation x2 visits.    Time  4    Period  Weeks    Status  Revised   renewed for 4 weeks     SLP LONG TERM GOAL #5   Title  Pt will demo auditory comprehension of 10 minutes simple-mod complex conversation by answering questions or commenting appropriately x 2 visits.    Time  4    Period  Weeks    Status  Revised   renewed for 4 weeks     SLP LONG TERM GOAL #6   Title  Pt will demo or eport success with phone calls (medical, disability services, personal) with use of compensations over 3 therapy sessions.    Baseline  07/17/19    Time  4  Period  Weeks    Status  Revised   renewed for 4 weeks       Plan - 08/15/19 1048    Clinical Impression Statement  Mr. Brysyn Brandenberger presents with moderate cognitive deficits, mild-moderate aphasia, and mild dysarthria. Pt able to comprehend mod complex conversation with use of written cues, visual supports and rephrasing today. Stimulable for improved intelligibility with amplitude-based cuing, however pt cont to report he gets a headache when speaking loudly, at times. Renewal completed today; pt would benefit from 2x per week, however has scheduled 1x per week at this time due to self-pay status. I recommend skilled ST to maximize cognition, language, and speech for safety, effective communication, and increased independence for return to work.    Speech Therapy Frequency  2x / week   pt currently scheduled 1x per week due to self-pay   Duration  --   4 additional weeks, 19 total sessions   Treatment/Interventions  Functional tasks;Patient/family education;Environmental controls;Cognitive reorganization;Multimodal communcation approach;Language facilitation;Compensatory techniques;Internal/external aids;SLP instruction and feedback    Potential to Achieve Goals  Good       Patient will benefit from skilled therapeutic intervention in order to improve the following deficits and impairments:   Dysarthria and anarthria  Aphasia  Cognitive communication deficit    Problem List Patient Active Problem List   Diagnosis Date Noted  . Abnormal PFT 08/12/2019  . HLD (hyperlipidemia) 05/23/2019  . CVA (cerebral vascular accident) [facility]) 05/21/2019   Deneise Lever, [place], [place] Speech-Language Pathologist   Aliene Altes 08/15/2019, 10:52 AM  [facility] [address] [place], [place], [address] Phone: [phone]   Fax:  [phone]   Name: Tarvaris Puglia MRN: [id number] Date of Birth: Aug 31, 1971

## 2019-08-19 ENCOUNTER — Other Ambulatory Visit: Payer: Self-pay

## 2019-08-19 DIAGNOSIS — E785 Hyperlipidemia, unspecified: Secondary | ICD-10-CM

## 2019-08-19 MED ORDER — ATORVASTATIN CALCIUM 40 MG PO TABS: 40.0000 mg | ORAL_TABLET | Freq: Every day | ORAL | 3 refills | Status: DC

## 2019-08-20 ENCOUNTER — Ambulatory Visit: Payer: PRIVATE HEALTH INSURANCE | Admitting: Occupational Therapy

## 2019-08-20 ENCOUNTER — Other Ambulatory Visit: Payer: Self-pay

## 2019-08-20 ENCOUNTER — Encounter: Payer: Self-pay | Admitting: Occupational Therapy

## 2019-08-20 DIAGNOSIS — R41840 Attention and concentration deficit: Secondary | ICD-10-CM

## 2019-08-20 DIAGNOSIS — I69318 Other symptoms and signs involving cognitive functions following cerebral infarction: Secondary | ICD-10-CM

## 2019-08-20 NOTE — Patient Instructions (Addendum)
    Vocational Rehabilitation [address] [place], [place] [address] [phone]  The Division of Vocational Rehabilitation Services provides counseling, training, education, transportation, job placement, assistive technology, job coaching, and other support services to people with disabilities.                                             Memory Compensation Strategies  1. Use "WARM" strategy. W= write it down A=  associate it R=  repeat it M=  make a mental picture  2. You can keep a Social worker. Use a 3-ring notebook with sections for the following:  calendar, important names and phone numbers, medications, doctors' names/phone numbers, "to do list"/reminders, and a section to journal what you did each day  3. Use a calendar to write appointments down.  4. Write yourself a schedule for the day/make a routine This can be placed on the calendar or in a separate section of the Memory Notebook.  Keeping a regular schedule can help memory.  5. Use medication organizer with sections for each day or morning/evening pills  You may need help loading it  6. Keep a basket, or pegboard by the door.   Place items that you need to take out with you in the basket or on the pegboard.  You may also want to include a message board for reminders.  7. Use sticky notes. Place sticky notes with reminders in a place where the task is performed.  For example:  "turn off the stove" placed by the stove, "lock the door" placed on the door at eye level, "take your medications" on the bathroom mirror or by the place where you normally take your medications  8. Use alarms/timers.  Use while cooking to remind yourself to check on food or as a reminder to take your medicine, or as a reminder to make a call, or as a reminder to perform another task, etc.  9. Use a voice memo to record important information and notes for yourself.  10.  Organize and  reduce clutter

## 2019-08-20 NOTE — Therapy (Signed)
[facility] [address] [place], [place], [address] Phone: [phone]   Fax:  [phone]  Occupational Therapy Treatment  Patient Details  Name: Brandon Summers MRN: [id number] Date of Birth: [birth year] Referring Provider (OT): Lanae Boast, FNP  (PCP appt to establish care 06/07/19)   Encounter Date: 08/20/2019  OT End of Session - 08/20/19 1351    Visit Number  9    Number of Visits  9    Date for OT Re-Evaluation  07/29/19    Authorization Type  self pay    OT Start Time  1235    OT Stop Time  1319    OT Time Calculation (min)  44 min    Activity Tolerance  Patient tolerated treatment well    Behavior During Therapy  [facility] for tasks assessed/performed       Past Medical History:  Diagnosis Date  . Stroke (cerebrum) ([place])   . Tobacco abuse     Past Surgical History:  Procedure Laterality Date  . BUBBLE STUDY  05/23/2019   Procedure: BUBBLE STUDY;  Surgeon: Pixie Casino, MD;  Location: [facility] ENDOSCOPY;  Service: Cardiovascular;;  . implantable loop recorder placement  07/26/2019   MDT Reveal [id number] implanted by Dr Rayann Heman for cryptogenic stroke  . TEE WITHOUT CARDIOVERSION N/A 05/23/2019   Procedure: TRANSESOPHAGEAL ECHOCARDIOGRAM (TEE);  Surgeon: Pixie Casino, MD;  Location: [facility] ENDOSCOPY;  Service: Cardiovascular;  Laterality: N/A;    There were no vitals filed for this visit.  Subjective Assessment - 08/20/19 1238    Subjective   Pt reports that dtr doesn't want to watch him cook (dtr that lives with him).  Dtr won't let him wash clothes    Pertinent History  CVA 05/21/19; PMH:  HLD, nicotine abuse    Limitations  cognitive deficits, language deficits    Patient Stated Goals  return to work, independence    Pain Onset  In the past 7 days        Simple cooking task (making egg).  Pt able to cook with cueing prior as reminder to use spatula as pt remembered that he did something he shouldn't, but  couldn't remember what.  Pt gathered needed items with incr time and then cooked with cueing to turn down temp to avoid oil popping up, and again reminders to use spatula.  Pt did remember to turn off stove and remove pan independently.     OT Education - 08/20/19 1350    Education Details  Memory Compensation Strategies; Voc Rehab Services    Person(s) Educated  Patient    Methods  Explanation;Handout    Comprehension  Verbalized understanding       OT Short Term Goals - 08/20/19 1400      OT SHORT TERM GOAL #1   Title  Pt/caregiver will verbalize understanding of cognitive compensation strategies for incr safety/independence with ADLs/IADLs.--check STGs 06/29/19    Baseline  dependent    Time  4    Period  Weeks    Status  Deferred   Partially met as Issued/addressed by ST and issued/reviewed by OT 08/20/19 and educated pt in safety strategies with cooking     OT SHORT TERM GOAL #2   Title  Pt will perform simple cooking task mod I.    Baseline  dependent/has not attempted    Time  4    Period  Weeks    Status  Not Met   supervision, min-mod v.c  08/20/19:  supervision, min-mod cueing.     OT SHORT TERM GOAL #3   Title  Pt will perform simple home maintenance task mod I.    Baseline  dependent/has not attempted    Time  4    Period  Weeks    Status  Partially Met   08/13/19:   pt able to perform safely in clinic, but is not performing at home due to home situation per pt report     OT SHORT TERM GOAL #4   Title  Pt will perform simple functional organization/problem solving tasks with at least 90% accuracy.    Time  4    Period  Weeks    Status  On-going   grossly 75-80% today, 08/13/19:  approx 75-80% accuracy       OT Long Term Goals - 08/20/19 1403      OT LONG TERM GOAL #1   Title  Pt will perform simple alternating-divided attention activity (at least 1 cognitive and 1 physical task) with at least 90% accuracy for incr safety/in prep for return to work.     Baseline  pt reports difficulty with attending to simple physical task    Time  8    Period  Weeks    Status  Not Met   08/20/19:  pt with slow progressing and unable to continue physical task with cognitive task     OT LONG TERM GOAL #2   Title  Pt will perform mod complex cooking task mod I.    Baseline  dependent/has not attempted    Time  8    Period  Weeks    Status  Not Met      OT LONG TERM GOAL #3   Title  Pt will perform mod complex home maintenance task mod I.    Baseline  dependent/has not attempted    Time  8    Period  Weeks    Status  Not Met   08/20/19:  is not performing     OT LONG TERM GOAL #4   Title  Pt will perform previous financial management tasks with supervision.    Baseline  needs assist    Time  8    Period  Weeks    Status  New            Plan - 08/20/19 1404    Clinical Impression Statement  Progress limited cognitive deficits and by decr caregiver support for carryover.  Recommended and provided info for Voc Rehab services.  Anticipate renewal/discharge next week due to decr progress/plateau.    OT Occupational Profile and History  Problem Focused Assessment - Including review of records relating to presenting problem    Occupational performance deficits (Please refer to evaluation for details):  ADL's;IADL's;Work;Leisure;Social Participation    Body Structure / Function / Physical Skills  ADL;Decreased knowledge of use of DME;Decreased knowledge of precautions    Cognitive Skills  Attention;Memory;Problem Solve;Safety Awareness;Thought;Understand    Rehab Potential  Good    Clinical Decision Making  Several treatment options, min-mod task modification necessary    Comorbidities Affecting Occupational Performance:  May have comorbidities impacting occupational performance    Modification or Assistance to Complete Evaluation   Min-Moderate modification of tasks or assist with assess necessary to complete eval    OT Frequency  2x / week    recommended, but 1x week at this time due to self pay/financial concerns at pt/family request   OT Duration  8  weeks    OT Treatment/Interventions  Self-care/ADL training;DME and/or AE instruction;Therapeutic activities;Cognitive remediation/compensation;Visual/perceptual remediation/compensation;Patient/family education    Plan  continue with functional cognition; environmental scanning; simple financial management tasks    Consulted and Agree with Plan of Care  Patient;Family member/caregiver    Family Member Consulted  ex-wife/caregiver       Patient will benefit from skilled therapeutic intervention in order to improve the following deficits and impairments:   Body Structure / Function / Physical Skills: ADL, Decreased knowledge of use of DME, Decreased knowledge of precautions Cognitive Skills: Attention, Memory, Problem Solve, Safety Awareness, Thought, Understand     Visit Diagnosis: Attention and concentration deficit  Other symptoms and signs involving cognitive functions following cerebral infarction    Problem List Patient Active Problem List   Diagnosis Date Noted  . Abnormal PFT 08/12/2019  . HLD (hyperlipidemia) 05/23/2019  . CVA (cerebral vascular accident) [facility]) 05/21/2019    [facility] 08/20/2019, 2:15 PM  [facility] [address] [place], [place], [address] Phone: [phone]   Fax:  [phone]  Name: Brandon Summers MRN: [id number] Date of Birth: 03-17-1971   Vianne Bulls, OTR/L [facility] [address] [place], [place]  [address] [phone] phone [phone] 08/20/19 2:15 PM

## 2019-08-21 ENCOUNTER — Ambulatory Visit: Payer: PRIVATE HEALTH INSURANCE | Admitting: Speech Pathology

## 2019-08-21 ENCOUNTER — Encounter: Payer: Self-pay | Admitting: Speech Pathology

## 2019-08-21 DIAGNOSIS — R4701 Aphasia: Secondary | ICD-10-CM

## 2019-08-21 DIAGNOSIS — R41841 Cognitive communication deficit: Secondary | ICD-10-CM

## 2019-08-21 DIAGNOSIS — I69318 Other symptoms and signs involving cognitive functions following cerebral infarction: Secondary | ICD-10-CM | POA: Diagnosis not present

## 2019-08-21 DIAGNOSIS — R471 Dysarthria and anarthria: Secondary | ICD-10-CM

## 2019-08-21 NOTE — Therapy (Signed)
[facility] [address] [place], [place], [address] Phone: [phone]   Fax:  [phone]  Speech Language Pathology Treatment  Patient Details  Name: Brandon Summers MRN: [id number] Date of Birth: 31-May-1971 Referring Provider (SLP): Lanae Boast, FNP (PCP appt to establish care 06/07/19)   Encounter Date: 08/21/2019  End of Session - 08/21/19 1257    Visit Number  12    Number of Visits  19    Date for SLP Re-Evaluation  10/14/19    Authorization Type  self-pay; has applied for Medicaid    SLP Start Time  1016    SLP Stop Time   1058    SLP Time Calculation (min)  42 min    Activity Tolerance  Patient tolerated treatment well       Past Medical History:  Diagnosis Date  . Stroke (cerebrum) ([place])   . Tobacco abuse     Past Surgical History:  Procedure Laterality Date  . BUBBLE STUDY  05/23/2019   Procedure: BUBBLE STUDY;  Surgeon: Pixie Casino, MD;  Location: [facility] ENDOSCOPY;  Service: Cardiovascular;;  . implantable loop recorder placement  07/26/2019   MDT Reveal [id number] implanted by Dr Rayann Heman for cryptogenic stroke  . TEE WITHOUT CARDIOVERSION N/A 05/23/2019   Procedure: TRANSESOPHAGEAL ECHOCARDIOGRAM (TEE);  Surgeon: Pixie Casino, MD;  Location: [facility] ENDOSCOPY;  Service: Cardiovascular;  Laterality: N/A;    There were no vitals filed for this visit.  Subjective Assessment - 08/21/19 1246    Subjective  "I turned it in and now I wait" re: disablity and medicaid card            ADULT SLP TREATMENT - 08/21/19 1246      General Information   Behavior/Cognition  Alert;Cooperative;Pleasant mood;Distractible      Treatment Provided   Treatment provided  Cognitive-Linquistic      Cognitive-Linquistic Treatment   Treatment focused on  Aphasia;Dysarthria    Skilled Treatment  Pt afirms he has completed HEP for dysarthria since last visit. He required min A for volume, however aphasic errors/word  finding episodes in automatic speech tasks required vebal cues usually. In structured phrase/sentence level describing task, pt carried over WNL volume with usual min visual cues and modeling. Word finding episodes in describing task and responsive naming task benefitted from phonemic and 1st letter written cues. Simple conversation pt required frequent verbal and visual cues to maintain volume. He did report a headache 6/10 at end of session. He did not have pain at start of session.  Pt reports he is not talking much at home and that he and his daughter who lives with him don't speak much. He continues to report that people are asking him to repeat himself. Reviewed vocational rehab information and provided handout  with phone number. Instructed pt to have someone help him contact voc rehab. I requested his daughter, Kelby Fam, attend next sessions on 08/27/19. Gave pt a note to ask her to attent OT/ST.       Assessment / Recommendations / Plan   Plan  Continue with current plan of care      Progression Toward Goals   Progression toward goals  Progressing toward goals       SLP Education - 08/21/19 1255    Education Details  compensations for dysarthira; continue HEP for dysarthria; compensations for aphasia    Person(s) Educated  Patient    Methods  Explanation;Demonstration;Verbal cues;Handout    Comprehension  Verbalized understanding;Verbal cues  required;Returned demonstration       SLP Short Term Goals - 08/21/19 1256      SLP SHORT TERM GOAL #1   Title  Pt will demo selective attention to therapy tasks for 10 minutes x2 sessions.    Baseline  06/11/19; 06/18/19    Time  1    Period  Weeks    Status  Achieved      SLP SHORT TERM GOAL #2   Title  Pt will establish a memory compensation system and bring to 2 therapy sessions.    Baseline  06/27/19, 07/02/19    Time  1    Period  Weeks    Status  Achieved      SLP SHORT TERM GOAL #3   Title  Pt will verbally sequence steps in a  functional task (eg recipes) with compensations for aphasia x3 sessions.    Time  1    Period  Weeks    Status  Deferred      SLP SHORT TERM GOAL #4   Title  Pt will demo auditory comprehension of mod complex multistep commands >85% accuracy x 3 visits.    Time  1    Period  Weeks    Status  Not Met      SLP SHORT TERM GOAL #5   Title  Pt will use compensations for aphasia and dysarthria in 8 minutes simple conversation, x 2 visits with rare min A.    Time  1    Period  Weeks    Status  Deferred   for focus on cognition      SLP Long Term Goals - 08/21/19 1257      SLP LONG TERM GOAL #1   Title  Pt will demo alternating attention in a functional task for 15 minutes x2 sessions.    Time  2   or 17 sessions, for all LTGs   Period  Weeks    Status  Deferred      SLP LONG TERM GOAL #2   Title  Pt will use memory compensation system to recall personal, schedule, or appointment information in 3 therapy sessions.    Baseline  07/09/19; 07/17/19;    Time  4    Period  Weeks    Status  Revised   renewed for 4 weeks     SLP LONG TERM GOAL #3   Title  Pt will engage functionally in 5 minutes simple-mod complex conversation with compensations for aphasia, x2 visits.    Time  4    Period  Weeks    Status  Revised   renewed for 4 weeks     SLP LONG TERM GOAL #4   Title  Pt will maintain adequate volume and vocal quality in 5 minutes simple conversation x2 visits.    Time  4    Period  Weeks    Status  Revised   renewed for 4 weeks     SLP LONG TERM GOAL #5   Title  Pt will demo auditory comprehension of 10 minutes simple-mod complex conversation by answering questions or commenting appropriately x 2 visits.    Time  4    Period  Weeks    Status  Revised   renewed for 4 weeks     SLP LONG TERM GOAL #6   Title  Pt will demo or eport success with phone calls (medical, disability services, personal) with use of compensations over 3 therapy sessions.  Baseline  07/17/19     Time  4    Period  Weeks    Status  Revised   renewed for 4 weeks      Plan - 08/21/19 1256    Clinical Impression Statement  Mr. Lev Cervone presents with moderate cognitive deficits, mild-moderate aphasia, and mild dysarthria. Pt able to comprehend mod complex conversation with use of written cues, visual supports and rephrasing today. Stimulable for improved intelligibility with amplitude-based cuing, however pt cont to report he gets a headache when speaking loudly, at times. Renewal completed today; pt would benefit from 2x per week, however has scheduled 1x per week at this time due to self-pay status. I recommend skilled ST to maximize cognition, language, and speech for safety, effective communication, and increased independence for return to work.       Patient will benefit from skilled therapeutic intervention in order to improve the following deficits and impairments:   Dysarthria and anarthria  Aphasia  Cognitive communication deficit    Problem List Patient Active Problem List   Diagnosis Date Noted  . Abnormal PFT 08/12/2019  . HLD (hyperlipidemia) 05/23/2019  . CVA (cerebral vascular accident) ([place]) 05/21/2019    Tymeka Privette, Annye Rusk MS, [place] 08/21/2019, 12:58 PM  [facility] [address] [place], [place], [address] Phone: [phone]   Fax:  [phone]   Name: Inmer Nix MRN: [id number] Date of Birth: Nov 02, 1971

## 2019-08-21 NOTE — Patient Instructions (Addendum)
   Ask Brandon Summers if she can come to your next Speech Therapy session with  Brandon Summers on Tuesday 9/29 at 2:00 (You have OT at 1:15 with Brandon Summers - it would be good if Brandon Summers could come to that appointment also  Do your speech exercises that [name] gave you twice a day for volume   Let others know you are having trouble finding your words and remembering. You look good on the outside, so some people may not understand  Your brain is still healing

## 2019-08-27 ENCOUNTER — Other Ambulatory Visit: Payer: Self-pay

## 2019-08-27 ENCOUNTER — Emergency Department (HOSPITAL_COMMUNITY)
Admission: EM | Admit: 2019-08-27 | Discharge: 2019-08-27 | Disposition: A | Discharge status: Home / Self Care | Payer: Medicaid Other | Attending: Emergency Medicine | Admitting: Emergency Medicine

## 2019-08-27 ENCOUNTER — Ambulatory Visit: Payer: PRIVATE HEALTH INSURANCE | Admitting: Occupational Therapy

## 2019-08-27 ENCOUNTER — Ambulatory Visit: Payer: PRIVATE HEALTH INSURANCE | Admitting: Speech Pathology

## 2019-08-27 ENCOUNTER — Encounter (HOSPITAL_COMMUNITY): Payer: Self-pay | Admitting: Emergency Medicine

## 2019-08-27 ENCOUNTER — Emergency Department (HOSPITAL_COMMUNITY): Payer: Medicaid Other

## 2019-08-27 DIAGNOSIS — Z87891 Personal history of nicotine dependence: Secondary | ICD-10-CM | POA: Diagnosis not present

## 2019-08-27 DIAGNOSIS — Z7982 Long term (current) use of aspirin: Secondary | ICD-10-CM | POA: Diagnosis not present

## 2019-08-27 DIAGNOSIS — Z79899 Other long term (current) drug therapy: Secondary | ICD-10-CM | POA: Insufficient documentation

## 2019-08-27 DIAGNOSIS — J0100 Acute maxillary sinusitis, unspecified: Secondary | ICD-10-CM | POA: Diagnosis not present

## 2019-08-27 DIAGNOSIS — R0789 Other chest pain: Secondary | ICD-10-CM | POA: Diagnosis not present

## 2019-08-27 LAB — BASIC METABOLIC PANEL
Anion gap: 11 (ref 5–15)
BUN: 12 mg/dL (ref 6–20)
CO2: 24 mmol/L (ref 22–32)
Calcium: 9 mg/dL (ref 8.9–10.3)
Chloride: 104 mmol/L (ref 98–111)
Creatinine, Ser: 1.01 mg/dL (ref 0.61–1.24)
GFR calc Af Amer: >60 mL/min (ref >60)
GFR calc non Af Amer: >60 mL/min (ref >60)
Glucose, Bld: 106 mg/dL — ABNORMAL HIGH (ref 70–99)
Potassium: 4.2 mmol/L (ref 3.5–5.1)
Sodium: 139 mmol/L (ref 135–145)

## 2019-08-27 LAB — CBC WITH DIFFERENTIAL/PLATELET
Abs Immature Granulocytes: 0.01 10*3/uL (ref 0.00–0.07)
Basophils Absolute: 0.1 10*3/uL (ref 0.0–0.1)
Basophils Relative: 1 %
Eosinophils Absolute: 0.2 10*3/uL (ref 0.0–0.5)
Eosinophils Relative: 4 %
HCT: 51.7 % (ref 39.0–52.0)
Hemoglobin: 16.2 g/dL (ref 13.0–17.0)
Immature Granulocytes: 0 %
Lymphocytes Relative: 42 %
Lymphs Abs: 2.6 10*3/uL (ref 0.7–4.0)
MCH: 23.2 pg — ABNORMAL LOW (ref 26.0–34.0)
MCHC: 31.3 g/dL (ref 30.0–36.0)
MCV: 74 fL — ABNORMAL LOW (ref 80.0–100.0)
Monocytes Absolute: 0.6 10*3/uL (ref 0.1–1.0)
Monocytes Relative: 9 %
Neutro Abs: 2.8 10*3/uL (ref 1.7–7.7)
Neutrophils Relative %: 44 %
Platelets: 233 10*3/uL (ref 150–400)
RBC: 6.99 MIL/uL — ABNORMAL HIGH (ref 4.22–5.81)
RDW: 17 % — ABNORMAL HIGH (ref 11.5–15.5)
WBC: 6.3 10*3/uL (ref 4.0–10.5)
nRBC: 0 % (ref 0.0–0.2)

## 2019-08-27 LAB — TROPONIN I (HIGH SENSITIVITY): Troponin I (High Sensitivity): 4 ng/L (ref <18)

## 2019-08-27 MED ORDER — AMOXICILLIN-POT CLAVULANATE 875-125 MG PO TABS: 1.0000 | ORAL_TABLET | Freq: Two times a day (BID) | ORAL | 0 refills | Status: DC

## 2019-08-27 NOTE — Discharge Instructions (Signed)
Return for worsening bleeding, chest pain that occurs with exertion.

## 2019-08-27 NOTE — ED Notes (Signed)
Recently had a stroke in May of this year. Patient states that he only takes ASA and Atorvastatin and has been "spitting up blood" intermittently. Patient reports no neurological deficits, is ambulatory and AAO x 4.

## 2019-08-27 NOTE — ED Provider Notes (Signed)
[facility]-EMERGENCY DEPT Provider Note   CSN: [id number] Arrival date & time: 08/27/19  [id number]     History   Chief Complaint Chief Complaint  Patient presents with  . blood in spit  . Medication Reaction    HPI Brandon Summers is a 48 y.o. male.     48 yo M with a chief complaints of bloody congestion.  This been going on for about 3 months.  No fevers or chills.  Has headaches off and on.  He is also had some left-sided chest discomfort.  This been going on for about 5 days.  Nothing seems to make it better or worse.  [place].  No radiation.  No shortness of breath no vomiting no diaphoresis.  He denies cough or coughing up blood.  Denies fevers.  Denies abdominal pain.  The history is provided by the patient.  Illness Severity:  Moderate Onset quality:  Gradual Duration:  3 months Timing:  Constant Progression:  Worsening Chronicity:  New Associated symptoms: chest pain, congestion and headaches   Associated symptoms: no abdominal pain, no diarrhea, no fever, no myalgias, no rash, no shortness of breath and no vomiting     Past Medical History:  Diagnosis Date  . Stroke (cerebrum) ([facility])   . Tobacco abuse     Patient Active Problem List   Diagnosis Date Noted  . Abnormal PFT 08/12/2019  . HLD (hyperlipidemia) 05/23/2019  . CVA (cerebral vascular accident) ([facility]) 05/21/2019    Past Surgical History:  Procedure Laterality Date  . BUBBLE STUDY  05/23/2019   Procedure: BUBBLE STUDY;  Surgeon: Chrystie NoseHilty, Kenneth C, MD;  [facility] END[facility];  Service: Cardiovascular;;  . implantable loop recorder placement  07/26/2019   MDT Reveal [id number] implanted by Dr Johney FrameAllred for cryptogenic stroke  . TEE WITHOUT CARDIOVERSION N/A 05/23/2019   Procedure: TRANSESOPHAGEAL ECHOCARDIOGRAM (TEE);  Surgeon: Chrystie NoseHilty, Kenneth C, MD;  [facility];  Service: Cardiovascular;  Laterality: N/A;        Home Medications    Prior to Admission medications   Medication  Sig Start Date End Date Taking? Authorizing Provider  aspirin EC 81 MG tablet Take 1 tablet (81 mg total) by mouth daily. 06/07/19 06/06/20 Yes Mike Gipouglas, Andre, FNP  atorvastatin (LIPITOR) 40 MG tablet Take 1 tablet (40 mg total) by mouth daily at 6 PM. 08/19/19  Yes Jegede, Olugbemiga E, MD  amoxicillin-clavulanate (AUGMENTIN) 875-125 MG tablet Take 1 tablet by mouth 2 (two) times daily. One po bid x 7 days 08/27/19   Melene PlanFloyd, Einer Meals, DO  clopidogrel (PLAVIX) 75 MG tablet Take 1 tablet (75 mg total) by mouth daily. Patient not taking: Reported on 08/27/2019 05/24/19   Calvert Cantorizwan, Saima, MD    Family History Family History  Problem Relation Age of Onset  . Hypertension Mother   . Stroke Mother     Social History Social History   Tobacco Use  . Smoking status: Former Smoker    Types: Cigarettes    Quit date: 05/21/2019    Years since quitting: 0.2  . Smokeless tobacco: Never Used  . Tobacco comment: 1 pack in 4 days - cutting back per pt  Substance Use Topics  . Alcohol use: Yes    Comment: occasional  . Drug use: No     Allergies   Patient has no known allergies.   Review of Systems Review of Systems  Constitutional: Negative for chills and fever.  HENT: Positive for congestion, postnasal drip and sinus pressure. Negative for  facial swelling.   Eyes: Negative for discharge and visual disturbance.  Respiratory: Negative for shortness of breath.   Cardiovascular: Positive for chest pain. Negative for palpitations.  Gastrointestinal: Negative for abdominal pain, diarrhea and vomiting.  Musculoskeletal: Negative for arthralgias and myalgias.  Skin: Negative for color change and rash.  Neurological: Positive for headaches. Negative for tremors and syncope.  Psychiatric/Behavioral: Negative for confusion and dysphoric mood.     Physical Exam Updated Vital Signs BP 115/80   Pulse 62   Temp 97.9 F (36.6 C) (Oral)   Resp 11   SpO2 100%   Physical Exam Vitals signs and nursing note  reviewed.  Constitutional:      Appearance: He is well-developed.  HENT:     Head: Normocephalic and atraumatic.     Comments: Swollen turbinates, posterior nasal drip, no noted sinus ttp, tm normal bilaterally.   Eyes:     Pupils: Pupils are equal, round, and reactive to light.  Neck:     Musculoskeletal: Normal range of motion and neck supple.     Vascular: No JVD.  Cardiovascular:     Rate and Rhythm: Normal rate and regular rhythm.     Heart sounds: No murmur. No friction rub. No gallop.   Pulmonary:     Effort: No respiratory distress.     Breath sounds: No wheezing.  Abdominal:     General: There is no distension.     Tenderness: There is no guarding or rebound.  Musculoskeletal: Normal range of motion.  Skin:    Coloration: Skin is not pale.     Findings: No rash.  Neurological:     Mental Status: He is alert and oriented to person, place, and time.  Psychiatric:        Behavior: Behavior normal.      ED Treatments / Results  Labs (all labs ordered are listed, but only abnormal results are displayed) Labs Reviewed  CBC WITH DIFFERENTIAL/PLATELET - Abnormal; Notable for the following components:      Result Value   RBC 6.99 (*)    MCV 74.0 (*)    MCH 23.2 (*)    RDW 17.0 (*)    All other components within normal limits  BASIC METABOLIC PANEL - Abnormal; Notable for the following components:   Glucose, Bld 106 (*)    All other components within normal limits  TROPONIN I (HIGH SENSITIVITY)    EKG EKG Interpretation  Date/Time:  Tuesday August 27 2019 08:49:10 EDT Ventricular Rate:  63 PR Interval:    QRS Duration: 100 QT Interval:  392 QTC Calculation: 402 R Axis:   78 Text Interpretation:  Sinus rhythm No significant change since last tracing Confirmed by Melene Plan [phone]) on 08/27/2019 9:05:08 AM   Radiology Dg Chest 2 View  Result Date: 08/27/2019 CLINICAL DATA:  Chest pain EXAM: CHEST - 2 VIEW COMPARISON:  May 22, 2019 FINDINGS: Lungs are  clear. Heart size and pulmonary vascularity are normal. No adenopathy. There is a loop recorder on the left. No pneumothorax. No bone lesions. IMPRESSION: No edema or consolidation. Electronically Signed   By: Bretta Bang III M.D.   On: 08/27/2019 09:21    Procedures Procedures (including critical care time)  Medications Ordered in ED Medications - No data to display   Initial Impression / Assessment and Plan / ED Course  I have reviewed the triage vital signs and the nursing notes.  Pertinent labs & imaging results that were available during my care  of the patient were reviewed by me and considered in my medical decision making (see chart for details).        48 yo M with a chief complaint of bloody nasal congestion.  He thinks this is due to his medications that he started after he was diagnosed with a stroke.  Denies coughing up blood denies shortness of breath.  He is having some left-sided chest discomfort.  No change in 6 hours.  Will obtain an EKG troponin chest x-ray lab work.  I suspect the patient has a sinus infection.  With the length of his symptoms would treat with a course of antibiotics.  PCP follow-up.   Troponin is negative blood work is otherwise unremarkable.  Chest x-ray viewed by me without focal infiltrate pneumothorax.  Discharge home.  10:46 AM:  I have discussed the diagnosis/risks/treatment options with the patient and believe the pt to be eligible for discharge home to follow-up with PCP. We also discussed returning to the ED immediately if new or worsening sx occur. We discussed the sx which are most concerning (e.g., sudden worsening pain, fever, inability to tolerate by mouth) that necessitate immediate return. Medications administered to the patient during their visit and any new prescriptions provided to the patient are listed below.  Medications given during this visit Medications - No data to display   The patient appears reasonably screen and/or  stabilized for discharge and I doubt any other medical condition or other [facility] requiring further screening, evaluation, or treatment in the ED at this time prior to discharge.   Final Clinical Impressions(s) / ED Diagnoses   Final diagnoses:  Acute maxillary sinusitis, recurrence not specified  Atypical chest pain    ED Discharge Orders         Ordered    amoxicillin-clavulanate (AUGMENTIN) 875-125 MG tablet  2 times daily     08/27/19 [place], Kacper Cartlidge, DO 08/27/19 1046

## 2019-08-27 NOTE — ED Triage Notes (Signed)
Pt reports had blood in spit since he has ben taking two medications for about month.

## 2019-09-03 ENCOUNTER — Other Ambulatory Visit: Payer: Self-pay

## 2019-09-03 ENCOUNTER — Ambulatory Visit: Payer: PRIVATE HEALTH INSURANCE | Attending: Internal Medicine | Admitting: Speech Pathology

## 2019-09-03 DIAGNOSIS — R4701 Aphasia: Secondary | ICD-10-CM | POA: Diagnosis present

## 2019-09-03 DIAGNOSIS — R41841 Cognitive communication deficit: Secondary | ICD-10-CM

## 2019-09-03 DIAGNOSIS — R471 Dysarthria and anarthria: Secondary | ICD-10-CM | POA: Diagnosis present

## 2019-09-03 NOTE — Patient Instructions (Signed)
Vocational Rehabilitation  [email or web address]  The Division of Vocational Rehabilitation Services provides counseling, training, education, transportation, job placement, Chartered loss adjuster, job coaching, and other support services to people with disabilities.

## 2019-09-03 NOTE — Therapy (Signed)
[facility] [address] [place], [place], [address] Phone: [phone]   Fax:  [phone]  Speech Language Pathology Treatment  Patient Details  Name: Brandon Summers MRN: [id number] Date of Birth: 28-Jan-1971 Referring Provider (SLP): Lanae Boast, FNP (PCP appt to establish care 06/07/19)   Encounter Date: 09/03/2019  End of Session - 09/03/19 1300    Visit Number  13    Number of Visits  19    Date for SLP Re-Evaluation  10/14/19    Authorization Type  self-pay; has applied for Medicaid; total visits used (OT/ST = 22)    SLP Start Time  1148    SLP Stop Time   1237    SLP Time Calculation (min)  49 min    Activity Tolerance  Patient tolerated treatment well       Past Medical History:  Diagnosis Date  . Stroke (cerebrum) ([place])   . Tobacco abuse     Past Surgical History:  Procedure Laterality Date  . BUBBLE STUDY  05/23/2019   Procedure: BUBBLE STUDY;  Surgeon: Pixie Casino, MD;  Location: [facility] ENDOSCOPY;  Service: Cardiovascular;;  . implantable loop recorder placement  07/26/2019   MDT Reveal [id number] implanted by Dr Rayann Heman for cryptogenic stroke  . TEE WITHOUT CARDIOVERSION N/A 05/23/2019   Procedure: TRANSESOPHAGEAL ECHOCARDIOGRAM (TEE);  Surgeon: Pixie Casino, MD;  Location: [facility] ENDOSCOPY;  Service: Cardiovascular;  Laterality: N/A;    There were no vitals filed for this visit.  Subjective Assessment - 09/03/19 1148    Subjective  "They said I have an infection. That's why I was bleeding."    Currently in Pain?  No/denies            ADULT SLP TREATMENT - 09/03/19 1259      General Information   Behavior/Cognition  Alert;Cooperative;Pleasant mood;Distractible      Treatment Provided   Treatment provided  Cognitive-Linquistic      Pain Assessment   Pain Assessment  No/denies pain      Cognitive-Linquistic Treatment   Treatment focused on  Aphasia;Dysarthria    Skilled Treatment  Pt  greeted SLP with louder voice today. Demonstrated HEP with rare min A for incr vocal intensity/dysarthria compensations. In 5 minutes simple-mod conversation pt's language and vocal intensity were functional with extended time. Pt denied headache when using louder speech today.  Pt used phone/mychart app to locate upcoming appointment information independently. Pt told SLP of continued frustrations with using the phone and communicating with unfamiliar people. Pt reports he is reluctant to ask for extra time or requesting for repetitions when he does not understand. SLP provided pt with aphasia wallet card for pt to share with others re: his communication difficulties and how to support pt's comprehension/expression. Provided information re: Vocational Rehab services in handout form and over the phone to pt's ex-wife. Added script for requesting more time on the phone to pt's dysarthria HEP.      Assessment / Recommendations / Plan   Plan  Continue with current plan of care      Progression Toward Goals   Progression toward goals  Progressing toward goals       SLP Education - 09/03/19 1259    Education Details  vocational rehabilitation    Person(s) Educated  Patient    Methods  Explanation;Handout    Comprehension  Verbalized understanding       SLP Short Term Goals - 09/03/19 1154      SLP SHORT  TERM GOAL #1   Title  Pt will demo selective attention to therapy tasks for 10 minutes x2 sessions.    Baseline  06/11/19; 06/18/19    Time  1    Period  Weeks    Status  Achieved      SLP SHORT TERM GOAL #2   Title  Pt will establish a memory compensation system and bring to 2 therapy sessions.    Baseline  06/27/19, 07/02/19    Time  1    Period  Weeks    Status  Achieved      SLP SHORT TERM GOAL #3   Title  Pt will verbally sequence steps in a functional task (eg recipes) with compensations for aphasia x3 sessions.    Time  1    Period  Weeks    Status  Deferred      SLP SHORT TERM  GOAL #4   Title  Pt will demo auditory comprehension of mod complex multistep commands >85% accuracy x 3 visits.    Time  1    Period  Weeks    Status  Not Met      SLP SHORT TERM GOAL #5   Title  Pt will use compensations for aphasia and dysarthria in 8 minutes simple conversation, x 2 visits with rare min A.    Time  1    Period  Weeks    Status  Deferred   for focus on cognition      SLP Long Term Goals - 09/03/19 1217      SLP LONG TERM GOAL #1   Title  Pt will demo alternating attention in a functional task for 15 minutes x2 sessions.    Time  2   or 17 sessions, for all LTGs   Period  Weeks    Status  Deferred      SLP LONG TERM GOAL #2   Title  Pt will use memory compensation system to recall personal, schedule, or appointment information in 3 therapy sessions.    Baseline  07/09/19; 07/17/19; 09/03/19    Time  3    Period  Weeks    Status  Achieved   renewed for 4 weeks     SLP LONG TERM GOAL #3   Title  Pt will engage functionally in 5 minutes simple-mod complex conversation with compensations for aphasia, x2 visits.    Baseline  09/03/19    Time  3    Period  Weeks    Status  On-going   renewed for 4 weeks     SLP LONG TERM GOAL #4   Title  Pt will maintain adequate volume and vocal quality in 5 minutes simple conversation x2 visits.    Baseline  09/03/19    Time  3    Period  Weeks    Status  On-going   renewed for 4 weeks     SLP LONG TERM GOAL #5   Title  Pt will demo auditory comprehension of 10 minutes simple-mod complex conversation by answering questions or commenting appropriately x 2 visits.    Time  3    Period  Weeks    Status  On-going   renewed for 4 weeks     SLP LONG TERM GOAL #6   Title  Pt will demo or eport success with phone calls (medical, disability services, personal) with use of compensations over 3 therapy sessions.    Baseline  07/17/19    Time  3    Period  Weeks    Status  On-going   renewed for 4 weeks      Plan -  09/03/19 1301    Clinical Impression Statement  Mr. Dene Nazir presents with moderate cognitive deficits, mild-moderate aphasia, and mild dysarthria. Pt with functional expression, comprehension, and vocal intensity in 5 min simple-mod complex conversation today. Continues to struggle communicating over the phone and with unfamiliar people. Discussed plan going forward; plan to d/c in next 1-2 visits and pt to follow up with vocational rehabilitation. Have advised pt to request referral for OT/ST next calendar year as pt has nearly used Medicaid visit limit (approval is still pending). I recommend skilled ST to maximize cognition, language, and speech for safety, effective communication, and increased independence for return to work.       Patient will benefit from skilled therapeutic intervention in order to improve the following deficits and impairments:   Cognitive communication deficit  Aphasia  Dysarthria and anarthria    Problem List Patient Active Problem List   Diagnosis Date Noted  . Abnormal PFT 08/12/2019  . HLD (hyperlipidemia) 05/23/2019  . CVA (cerebral vascular accident) [facility]) 05/21/2019   Deneise Lever, [place], [place] 09/03/2019, 1:06 PM  [facility] [address] [place], [place], [address] Phone: [phone]   Fax:  [phone]   Name: Oaklen Thiam MRN: [id number] Date of Birth: [birth year]

## 2019-09-06 ENCOUNTER — Encounter: Payer: Self-pay | Admitting: Neurology

## 2019-09-09 ENCOUNTER — Ambulatory Visit (INDEPENDENT_AMBULATORY_CARE_PROVIDER_SITE_OTHER): Payer: PRIVATE HEALTH INSURANCE | Admitting: Family Medicine

## 2019-09-09 ENCOUNTER — Other Ambulatory Visit: Payer: Self-pay | Admitting: Family Medicine

## 2019-09-09 ENCOUNTER — Encounter: Payer: Self-pay | Admitting: Family Medicine

## 2019-09-09 ENCOUNTER — Other Ambulatory Visit: Payer: Self-pay

## 2019-09-09 VITALS — BP 104/65 | HR 78 | Temp 98.5°F | Ht 72.0 in | Wt 164.0 lb

## 2019-09-09 DIAGNOSIS — R059 Cough, unspecified: Secondary | ICD-10-CM | POA: Insufficient documentation

## 2019-09-09 DIAGNOSIS — R531 Weakness: Secondary | ICD-10-CM

## 2019-09-09 DIAGNOSIS — Z09 Encounter for follow-up examination after completed treatment for conditions other than malignant neoplasm: Secondary | ICD-10-CM

## 2019-09-09 DIAGNOSIS — Z7901 Long term (current) use of anticoagulants: Secondary | ICD-10-CM | POA: Diagnosis not present

## 2019-09-09 DIAGNOSIS — Z8673 Personal history of transient ischemic attack (TIA), and cerebral infarction without residual deficits: Secondary | ICD-10-CM | POA: Diagnosis not present

## 2019-09-09 DIAGNOSIS — Z720 Tobacco use: Secondary | ICD-10-CM | POA: Insufficient documentation

## 2019-09-09 DIAGNOSIS — R05 Cough: Secondary | ICD-10-CM | POA: Diagnosis not present

## 2019-09-09 DIAGNOSIS — R0602 Shortness of breath: Secondary | ICD-10-CM

## 2019-09-09 HISTORY — DX: Weakness: R53.1

## 2019-09-09 LAB — POCT URINALYSIS DIPSTICK
Bilirubin, UA: NEGATIVE
Blood, UA: NEGATIVE
Glucose, UA: NEGATIVE
Ketones, UA: NEGATIVE
Leukocytes, UA: NEGATIVE
Nitrite, UA: NEGATIVE
Protein, UA: NEGATIVE
Spec Grav, UA: 1.025 (ref 1.010–1.025)
Urobilinogen, UA: 1 E.U./dL
pH, UA: 7.5 (ref 5.0–8.0)

## 2019-09-09 MED ORDER — ALBUTEROL SULFATE HFA 108 (90 BASE) MCG/ACT IN AERS: 2.0000 | INHALATION_SPRAY | Freq: Four times a day (QID) | RESPIRATORY_TRACT | 11 refills | Status: DC | PRN

## 2019-09-09 MED ORDER — BENZONATATE 100 MG PO CAPS: 100.0000 mg | ORAL_CAPSULE | Freq: Two times a day (BID) | ORAL | 0 refills | Status: DC | PRN

## 2019-09-09 NOTE — Patient Instructions (Signed)
Cough, Adult A cough helps to clear your throat and lungs. A cough may be a sign of an illness or another medical condition. An acute cough may only last 2-3 weeks, while a chronic cough may last 8 or more weeks. Many things can cause a cough. They include:  Germs (viruses or bacteria) that attack the airway.  Breathing in things that bother (irritate) your lungs.  Allergies.  Asthma.  Mucus that runs down the back of your throat (postnasal drip).  Smoking.  Acid backing up from the stomach into the tube that moves food from the mouth to the stomach (gastroesophageal reflux).  Some medicines.  Lung problems.  Other medical conditions, such as heart failure or a blood clot in the lung (pulmonary embolism). Follow these instructions at home: Medicines  Take over-the-counter and prescription medicines only as told by your doctor.  Talk with your doctor before you take medicines that stop a cough (coughsuppressants). Lifestyle   Do not smoke, and try not to be around smoke. Do not use any products that contain nicotine or tobacco, such as cigarettes, e-cigarettes, and chewing tobacco. If you need help quitting, ask your doctor.  Drink enough fluid to keep your pee (urine) pale yellow.  Avoid caffeine.  Do not drink alcohol if your doctor tells you not to drink. General instructions   Watch for any changes in your cough. Tell your doctor about them.  Always cover your mouth when you cough.  Stay away from things that make you cough, such as perfume, candles, campfire smoke, or cleaning products.  If the air is dry, use a cool mist vaporizer or humidifier in your home.  If your cough is worse at night, try using extra pillows to raise your head up higher while you sleep.  Rest as needed.  Keep all follow-up visits as told by your doctor. This is important. Contact a doctor if:  You have new symptoms.  You cough up pus.  Your cough does not get better after 2-3  weeks, or your cough gets worse.  Cough medicine does not help your cough and you are not sleeping well.  You have pain that gets worse or pain that is not helped with medicine.  You have a fever.  You are losing weight and you do not know why.  You have night sweats. Get help right away if:  You cough up blood.  You have trouble breathing.  Your heartbeat is very fast. These symptoms may be an emergency. Do not wait to see if the symptoms will go away. Get medical help right away. Call your local emergency services (911 in the [place] Do not drive yourself to the hospital. Summary  A cough helps to clear your throat and lungs. Many things can cause a cough.  Take over-the-counter and prescription medicines only as told by your doctor.  Always cover your mouth when you cough.  Contact a doctor if you have new symptoms or you have a cough that does not get better or gets worse. This information is not intended to replace advice given to you by your health care provider. Make sure you discuss any questions you have with your health care provider. Document Released: 07/28/2011 Document Revised: 12/03/2018 Document Reviewed: 12/03/2018 Elsevier Patient Education  2020 [employer]. Shortness of Breath, Adult Shortness of breath means you have trouble breathing. Shortness of breath could be a sign of a medical problem. Follow these instructions at home:   Watch for any changes  in your symptoms.  Do not use any products that contain nicotine or tobacco, such as cigarettes, e-cigarettes, and chewing tobacco.  Do not smoke. Smoking can cause shortness of breath. If you need help to quit smoking, ask your doctor.  Avoid things that can make it harder to breathe, such as: ? Mold. ? Dust. ? Air pollution. ? Chemical smells. ? Things that can cause allergy symptoms (allergens), if you have allergies.  Keep your living space clean. Use products that help remove mold and dust.   Rest as needed. Slowly return to your normal activities.  Take over-the-counter and prescription medicines only as told by your doctor. This includes oxygen therapy and inhaled medicines.  Keep all follow-up visits as told by your doctor. This is important. Contact a doctor if:  Your condition does not get better as soon as expected.  You have a hard time doing your normal activities, even after you rest.  You have new symptoms. Get help right away if:  Your shortness of breath gets worse.  You have trouble breathing when you are resting.  You feel light-headed or you pass out (faint).  You have a cough that is not helped by medicines.  You cough up blood.  You have pain with breathing.  You have pain in your chest, arms, shoulders, or belly (abdomen).  You have a fever.  You cannot walk up stairs.  You cannot exercise the way you normally do. These symptoms may represent a serious problem that is an emergency. Do not wait to see if the symptoms will go away. Get medical help right away. Call your local emergency services (911 in the [place] Do not drive yourself to the hospital. Summary  Shortness of breath is when you have trouble breathing enough air. It can be a sign of a medical problem.  Avoid things that make it hard for you to breathe, such as smoking, pollution, mold, and dust.  Watch for any changes in your symptoms. Contact your doctor if you do not get better or you get worse. This information is not intended to replace advice given to you by your health care provider. Make sure you discuss any questions you have with your health care provider. Document Released: 05/02/2008 Document Revised: 04/16/2018 Document Reviewed: 04/16/2018 Elsevier Patient Education  [place]. Albuterol inhalation aerosol What is this medicine? ALBUTEROL (al Norm[place]ean [place]) is a bronchodilator. It helps open up the airways in your lungs to make it easier to breathe. This  medicine is used to treat and to prevent bronchospasm. This medicine may be used for other purposes; ask your health care provider or pharmacist if you have questions. COMMON BRAND NAME(S): Proair HFA, Proventil, Proventil HFA, Respirol, Ventolin, Ventolin HFA What should I tell my health care provider before I take this medicine? They need to know if you have any of the following conditions:  diabetes  heart disease or irregular heartbeat  high blood pressure  pheochromocytoma  seizures  thyroid disease  an unusual or allergic reaction to albuterol, levalbuterol, other medicines, foods, dyes, or preservatives  pregnant or trying to get pregnant  breast-feeding How should I use this medicine? This medicine is for inhalation through the mouth. Follow the directions on your prescription label. Take your medicine at regular intervals. Do not use more often than directed. Make sure that you are using your inhaler correctly. Ask your doctor or health care provider if you have any questions. Talk to your pediatrician regarding the  use of this medicine in children. While this drug may be prescribed for children as young as 4 years for selected conditions, precautions do apply. Overdosage: If you think you have taken too much of this medicine contact a poison control center or emergency room at once. NOTE: This medicine is only for you. Do not share this medicine with others. What if I miss a dose? If you miss a dose, use it as soon as you can. If it is almost time for your next dose, use only that dose. Do not use double or extra doses. What may interact with this medicine?  anti-infectives like chloroquine and pentamidine  caffeine  cisapride  diuretics  medicines for colds  medicines for depression or for emotional or psychotic conditions  medicines for weight loss including some herbal products  methadone  some antibiotics like clarithromycin, erythromycin, levofloxacin,  and linezolid  some heart medicines  steroid hormones like dexamethasone, cortisone, hydrocortisone  theophylline  thyroid hormones This list may not describe all possible interactions. Give your health care provider a list of all the medicines, herbs, non-prescription drugs, or dietary supplements you use. Also tell them if you smoke, drink alcohol, or use illegal drugs. Some items may interact with your medicine. What should I watch for while using this medicine? Tell your doctor or health care professional if your symptoms do not improve. Do not use extra albuterol. If your asthma or bronchitis gets worse while you are using this medicine, call your doctor right away. If your mouth gets dry try chewing sugarless gum or sucking hard candy. Drink water as directed. What side effects may I notice from receiving this medicine? Side effects that you should report to your doctor or health care professional as soon as possible:  allergic reactions like skin rash, itching or hives, swelling of the face, lips, or tongue  breathing problems  chest pain  feeling faint or lightheaded, falls  high blood pressure  irregular heartbeat  fever  muscle cramps or weakness  pain, tingling, numbness in the hands or feet  vomiting Side effects that usually do not require medical attention (report to your doctor or health care professional if they continue or are bothersome):  changes in taste  cough  dry mouth  headache  nervousness or trembling  stomach upset  stuffy or runny nose  throat irritation  trouble sleeping This list may not describe all possible side effects. Call your doctor for medical advice about side effects. You may report side effects to FDA at 1-800-FDA-1088. Where should I keep my medicine? Keep out of the reach of children. Store Proventil HFA and ProAir HFA at room temperature between 15 and 25 degrees C (59 and 77 degrees F). Store Ventolin HFA at room  temperature between 20 and 25 degrees C (68 and 77 degrees F); it may be stored between 15 and 30 degrees C (59 and 86 degrees F) on occasion. The contents are under pressure and may burst when exposed to heat or flame. Do not freeze. This medicine does not work as well if it is too cold. Throw away the inhaler when the dose counter displays "0" or after the expiration date on the package, whichever comes first. Ventolin HFA should be thrown away 12 months after removing it from the foil pouch. NOTE: This sheet is a summary. It may not cover all possible information. If you have questions about this medicine, talk to your doctor, pharmacist, or health care provider.  2020 Elsevier/Gold  Standard (2019-02-28 12:46:54) Benzonatate capsules What is this medicine? BENZONATATE (ben ZOE na tate) is used to treat cough. This medicine may be used for other purposes; ask your health care provider or pharmacist if you have questions. COMMON BRAND NAME(S): Tessalon Perles, Zonatuss What should I tell my health care provider before I take this medicine? They need to know if you have any of these conditions:  kidney or liver disease  an unusual or allergic reaction to benzonatate, anesthetics, other medicines, foods, dyes, or preservatives  pregnant or trying to get pregnant  breast-feeding How should I use this medicine? Take this medicine by mouth with a glass of water. Follow the directions on the prescription label. Avoid breaking, chewing, or sucking the capsule, as this can cause serious side effects. Take your medicine at regular intervals. Do not take your medicine more often than directed. Talk to your pediatrician regarding the use of this medicine in children. While this drug may be prescribed for children as young as 66 years old for selected conditions, precautions do apply. Overdosage: If you think you have taken too much of this medicine contact a poison control center or emergency room at  once. NOTE: This medicine is only for you. Do not share this medicine with others. What if I miss a dose? If you miss a dose, take it as soon as you can. If it is almost time for your next dose, take only that dose. Do not take double or extra doses. What may interact with this medicine? Do not take this medicine with any of the following medications:  MAOIs like Carbex, Eldepryl, Marplan, Nardil, and Parnate This list may not describe all possible interactions. Give your health care provider a list of all the medicines, herbs, non-prescription drugs, or dietary supplements you use. Also tell them if you smoke, drink alcohol, or use illegal drugs. Some items may interact with your medicine. What should I watch for while using this medicine? Tell your doctor if your symptoms do not improve or if they get worse. If you have a high fever, skin rash, or headache, see your health care professional. You may get drowsy or dizzy. Do not drive, use machinery, or do anything that needs mental alertness until you know how this medicine affects you. Do not sit or stand up quickly, especially if you are an older patient. This reduces the risk of dizzy or fainting spells. What side effects may I notice from receiving this medicine? Side effects that you should report to your doctor or health care professional as soon as possible:  allergic reactions like skin rash, itching or hives, swelling of the face, lips, or tongue  breathing problems  chest pain  confusion or hallucinations  irregular heartbeat  numbness of mouth or throat  seizures Side effects that usually do not require medical attention (report to your doctor or health care professional if they continue or are bothersome):  burning feeling in the eyes  constipation  headache  nasal congestion  stomach upset This list may not describe all possible side effects. Call your doctor for medical advice about side effects. You may report  side effects to FDA at 1-800-FDA-1088. Where should I keep my medicine? Keep out of the reach of children. Store at room temperature between 15 and 30 degrees C (59 and 86 degrees F). Keep tightly closed. Protect from light and moisture. Throw away any unused medicine after the expiration date. NOTE: This sheet is a summary. It may not  cover all possible information. If you have questions about this medicine, talk to your doctor, pharmacist, or health care provider.  2020 Elsevier/Gold Standard (2008-02-13 14:52:56)

## 2019-09-09 NOTE — Telephone Encounter (Signed)
Lanelle Bal,  Can you change this?> it is not covered on insurance.

## 2019-09-09 NOTE — Progress Notes (Signed)
[facility]   Established Patient Office Visit  Subjective:  Patient ID: Brandon Summers, male    DOB: [birth year]  Age: 48 y.o. MRN: [id number]  CC:  Chief Complaint  Patient presents with  . Follow-up    3 mth follow up, Dx Stroke  . Fatigue  . Blurred Vision    HPI Brandon Summers is a 48 year old male who presents for Follow Up today.   Past Medical History:  Diagnosis Date  . Stroke (cerebrum) (HCC)   . Tobacco abuse    Current Status: This will be his initial office visit with me. He was previously seeing Mike Gip, NP for his PCP needs. Since his last office visit, he is doing well with no complaints. He does have right-sided weakness r/t Stroke History, 05/21/2019. He has blurry vision in both eyes; vision is worse L>R. He denies visual changes, chest pain, shortness of breath, heart palpitations, and falls. He has occasional headaches and dizziness with position changes. Denies severe headaches, confusion, seizures, nausea and vomiting. He has follow up with Neurology on 12/05/2018. He has a smoking history of at least 1 pack day, which r/t to his chronic cough. He denies fevers, chills, fatigue, recent infections, weight loss, and night sweats. He has not had any headaches, visual changes, dizziness, and falls. No chest pain, heart palpitations, cough and shortness of breath reported. No reports of GI problems such as nausea, vomiting, diarrhea, and constipation. He has no reports of blood in stools, dysuria and hematuria. No depression or anxiety reported. He denies pain today.   Past Surgical History:  Procedure Laterality Date  . BUBBLE STUDY  05/23/2019   Procedure: BUBBLE STUDY;  Surgeon: Chrystie Nose, MD;  Location: [facility] ENDOSCOPY;  Service: Cardiovascular;;  . implantable loop recorder placement  07/26/2019   MDT Reveal OFBP1 implanted by Dr Johney Frame for cryptogenic stroke  . TEE WITHOUT CARDIOVERSION N/A 05/23/2019   Procedure: TRANSESOPHAGEAL ECHOCARDIOGRAM (TEE);  Surgeon: Chrystie Nose, MD;  Location: [facility] ENDOSCOPY;  Service: Cardiovascular;  Laterality: N/A;    Family History  Problem Relation Age of Onset  . Hypertension Mother   . Stroke Mother     Social History   Socioeconomic History  . Marital status: Married    Spouse name: Not on file  . Number of children: Not on file  . Years of education: Not on file  . Highest education level: Not on file  Occupational History  . Not on file  Social Needs  . Financial resource strain: Not on file  . Food insecurity    Worry: Not on file    Inability: Not on file  . Transportation needs    Medical: Not on file    Non-medical: Not on file  Tobacco Use  . Smoking status: Former Smoker    Types: Cigarettes    Quit date: 05/21/2019    Years since quitting: 0.3  . Smokeless tobacco: Never Used  . Tobacco comment: 1 pack in 4 days - cutting back per pt  Substance and Sexual Activity  . Alcohol use: Yes    Comment: occasional  . Drug use: No  . Sexual activity: Not on file  Lifestyle  . Physical activity    Days per week: Not on file    Minutes per session: Not on file  . Stress: Not on file  Relationships  . Social connections    Talks on phone: Not on file  Gets together: Not on file    Attends religious service: Not on file    Active member of club or organization: Not on file    Attends meetings of clubs or organizations: Not on file    Relationship status: Not on file  . Intimate partner violence    Fear of current or ex partner: Not on file    Emotionally abused: Not on file    Physically abused: Not on file    Forced sexual activity: Not on file  Other Topics Concern  . Not on file  Social History Narrative   Marital status: separated; moved from [place] to [place] 1998   Children: 2 children; no grandchildren.   Employment: chef   Tobacco: 1/2ppd   Alcohol: none   Drugs: none   Exercise: none.    no Caffeine     Outpatient Medications Prior to Visit  Medication Sig Dispense Refill  . aspirin EC 81 MG tablet Take 1 tablet (81 mg total) by mouth daily. 90 tablet 3  . atorvastatin (LIPITOR) 40 MG tablet Take 1 tablet (40 mg total) by mouth daily at 6 PM. 90 tablet 3  . amoxicillin-clavulanate (AUGMENTIN) 875-125 MG tablet Take 1 tablet by mouth 2 (two) times daily. One po bid x 7 days 14 tablet 0  . clopidogrel (PLAVIX) 75 MG tablet Take 1 tablet (75 mg total) by mouth daily. 21 tablet 0   No facility-administered medications prior to visit.     No Known Allergies  ROS Review of Systems  Constitutional: Negative.   HENT: Negative.   Eyes: Negative.   Respiratory: Positive for cough.   Cardiovascular: Negative.   Gastrointestinal: Negative.   Genitourinary: Negative.   Musculoskeletal: Positive for arthralgias (generalized pain).  Skin: Negative.   Neurological: Positive for dizziness, weakness (generalized) and headaches.  Hematological: Negative.   Psychiatric/Behavioral: Negative.       Objective:    Physical Exam  Constitutional: He is oriented to person, place, and time. He appears well-developed and well-nourished.  HENT:  Head: Normocephalic and atraumatic.  Eyes: Conjunctivae are normal.  Neck: Normal range of motion. Neck supple.  Cardiovascular: Normal rate, regular rhythm, normal heart sounds and intact distal pulses.  Pulmonary/Chest: Effort normal and breath sounds normal.  Abdominal: Soft. Bowel sounds are normal.  Musculoskeletal:     Comments: Limited ROM in right extremities  Neurological: He is alert and oriented to person, place, and time. He has normal reflexes.  Skin: Skin is warm and dry.  Psychiatric: He has a normal mood and affect. His behavior is normal. Judgment and thought content normal.  Nursing note and vitals reviewed.   BP 104/65 (BP Location: Left Arm, Patient Position: Sitting, Cuff Size: Normal)   Pulse 78   Temp 98.5 F (36.9 C)   Ht 6'  (1.829 m)   Wt 164 lb (74.4 kg)   SpO2 100%   BMI 22.24 kg/m  Wt Readings from Last 3 Encounters:  09/09/19 164 lb (74.4 kg)  08/12/19 160 lb 6.4 oz (72.8 kg)  07/26/19 163 lb (73.9 kg)     Health Maintenance Due  Topic Date Due  . HIV Screening  11/28/1986  . TETANUS/TDAP  11/28/1990  . INFLUENZA VACCINE  06/29/2019    There are no preventive care reminders to display for this patient.  No results found for: TSH Lab Results  Component Value Date   WBC 6.3 08/27/2019   HGB 16.2 08/27/2019   HCT 51.7 08/27/2019  MCV 74.0 (L) 08/27/2019   PLT 233 08/27/2019   Lab Results  Component Value Date   NA 139 08/27/2019   K 4.2 08/27/2019   CO2 24 08/27/2019   GLUCOSE 106 (H) 08/27/2019   BUN 12 08/27/2019   CREATININE 1.01 08/27/2019   BILITOT 0.5 05/21/2019   ALKPHOS 52 05/21/2019   AST 24 05/21/2019   ALT 44 05/21/2019   PROT 6.0 (L) 05/21/2019   ALBUMIN 3.3 (L) 05/21/2019   CALCIUM 9.0 08/27/2019   ANIONGAP 11 08/27/2019   Lab Results  Component Value Date   CHOL 170 05/22/2019   Lab Results  Component Value Date   HDL 44 05/22/2019   Lab Results  Component Value Date   LDLCALC 114 (H) 05/22/2019   Lab Results  Component Value Date   TRIG 61 05/22/2019   Lab Results  Component Value Date   CHOLHDL 3.9 05/22/2019   Lab Results  Component Value Date   HGBA1C 5.7 (H) 05/22/2019      Assessment & Plan:   1. History of CVA (cerebrovascular accident)  2. Right sided weakness  3. Generalized weakness  4. Shortness of breath We will initiate Inhaler today.  - albuterol (VENTOLIN HFA) 108 (90 Base) MCG/ACT inhaler; Inhale 2 puffs into the lungs every 6 (six) hours as needed for wheezing or shortness of breath.  Dispense: 8 g; Refill: 11  5. Cough We will initiate cough suppressant today.  - benzonatate (TESSALON) 100 MG capsule; Take 1 capsule (100 mg total) by mouth 2 (two) times daily as needed for cough.  Dispense: 20 capsule; Refill: 0   6. Blood thinned due to long-term anticoagulant use He recently discontinue Plavik and is currently taking ASA daily. - POCT urinalysis dipstick  7. Tobacco use  8. Declined smoking cessation  9. Follow up He will follow up in 2 months  Meds ordered this encounter  Medications  . albuterol (VENTOLIN HFA) 108 (90 Base) MCG/ACT inhaler    Sig: Inhale 2 puffs into the lungs every 6 (six) hours as needed for wheezing or shortness of breath.    Dispense:  8 g    Refill:  11  . benzonatate (TESSALON) 100 MG capsule    Sig: Take 1 capsule (100 mg total) by mouth 2 (two) times daily as needed for cough.    Dispense:  20 capsule    Refill:  0    Orders Placed This Encounter  Procedures  . POCT urinalysis dipstick    Referral Orders  No referral(s) requested today    Meds ordered this encounter  Medications  . albuterol (VENTOLIN HFA) 108 (90 Base) MCG/ACT inhaler    Sig: Inhale 2 puffs into the lungs every 6 (six) hours as needed for wheezing or shortness of breath.    Dispense:  8 g    Refill:  11  . benzonatate (TESSALON) 100 MG capsule    Sig: Take 1 capsule (100 mg total) by mouth 2 (two) times daily as needed for cough.    Dispense:  20 capsule    Refill:  0    Problem List Items Addressed This Visit      Cardiovascular and Mediastinum   CVA (cerebral vascular accident) (HCC) - Primary    Other Visit Diagnoses    Right sided weakness       Generalized weakness       Shortness of breath       Relevant Medications   albuterol (VENTOLIN HFA) 108 (  90 Base) MCG/ACT inhaler   Cough       Relevant Medications   benzonatate (TESSALON) 100 MG capsule   Blood thinned due to long-term anticoagulant use       Relevant Orders   POCT urinalysis dipstick (Completed)   Tobacco use       Declined smoking cessation       Follow up          Meds ordered this encounter  Medications  . albuterol (VENTOLIN HFA) 108 (90 Base) MCG/ACT inhaler    Sig: Inhale 2 puffs into the  lungs every 6 (six) hours as needed for wheezing or shortness of breath.    Dispense:  8 g    Refill:  11  . benzonatate (TESSALON) 100 MG capsule    Sig: Take 1 capsule (100 mg total) by mouth 2 (two) times daily as needed for cough.    Dispense:  20 capsule    Refill:  0    Follow-up: Return in about 2 months (around 11/09/2019).    Azzie Glatter, FNP

## 2019-09-10 ENCOUNTER — Ambulatory Visit: Payer: PRIVATE HEALTH INSURANCE | Admitting: Speech Pathology

## 2019-09-10 ENCOUNTER — Other Ambulatory Visit: Payer: Self-pay

## 2019-09-10 DIAGNOSIS — R41841 Cognitive communication deficit: Secondary | ICD-10-CM

## 2019-09-10 DIAGNOSIS — R471 Dysarthria and anarthria: Secondary | ICD-10-CM

## 2019-09-10 DIAGNOSIS — R4701 Aphasia: Secondary | ICD-10-CM

## 2019-09-10 NOTE — Patient Instructions (Signed)
Vocational [facility] # [id number]  [email or web address]   The Division of Vocational Rehabilitation Services provides counseling, training, education, transportation, job placement, Chartered loss adjuster, job coaching,and other support services to people with disabilities.  When you see Dr. Tomi Likens in January, you can request another referral for speech and occupational therapy, because your Medicaid visits will reset for the next year.

## 2019-09-11 ENCOUNTER — Ambulatory Visit (INDEPENDENT_AMBULATORY_CARE_PROVIDER_SITE_OTHER): Payer: PRIVATE HEALTH INSURANCE | Admitting: *Deleted

## 2019-09-11 DIAGNOSIS — I639 Cerebral infarction, unspecified: Secondary | ICD-10-CM | POA: Diagnosis not present

## 2019-09-12 ENCOUNTER — Other Ambulatory Visit: Payer: Self-pay

## 2019-09-12 DIAGNOSIS — R0602 Shortness of breath: Secondary | ICD-10-CM

## 2019-09-12 LAB — CUP PACEART REMOTE DEVICE CHECK
Date Time Interrogation Session: 20201014174046
Implantable Pulse Generator Implant Date: 20200828

## 2019-09-12 MED ORDER — ALBUTEROL SULFATE HFA 108 (90 BASE) MCG/ACT IN AERS: 2.0000 | INHALATION_SPRAY | Freq: Four times a day (QID) | RESPIRATORY_TRACT | 11 refills | Status: DC | PRN

## 2019-09-12 NOTE — Therapy (Signed)
[facility] [address] [place], [place], [address] Phone: [phone]   Fax:  [phone]  Speech Language Pathology Treatment  Patient Details  Name: Brandon Summers MRN: [id number] Date of Birth: March 12, 1971 Referring Provider (SLP): Lanae Boast, FNP (PCP appt to establish care 06/07/19)   Encounter Date: 09/10/2019  End of Session - 09/12/19 1209    Visit Number  14    Number of Visits  19    Date for SLP Re-Evaluation  10/14/19    Authorization Type  self-pay; has applied for Medicaid; total visits used (OT/ST = 23)    SLP Start Time  1145    SLP Stop Time   8101    SLP Time Calculation (min)  50 min       Past Medical History:  Diagnosis Date  . Cough   . Generalized weakness   . Shortness of breath   . Stroke (cerebrum) ([place])   . Tobacco abuse   . Tobacco use   . Visual problems     Past Surgical History:  Procedure Laterality Date  . BUBBLE STUDY  05/23/2019   Procedure: BUBBLE STUDY;  Surgeon: Pixie Casino, MD;  Location: [facility] ENDOSCOPY;  Service: Cardiovascular;;  . implantable loop recorder placement  07/26/2019   MDT Reveal [id number] implanted by Dr Rayann Heman for cryptogenic stroke  . TEE WITHOUT CARDIOVERSION N/A 05/23/2019   Procedure: TRANSESOPHAGEAL ECHOCARDIOGRAM (TEE);  Surgeon: Pixie Casino, MD;  Location: [facility] ENDOSCOPY;  Service: Cardiovascular;  Laterality: N/A;    There were no vitals filed for this visit.         ADULT SLP TREATMENT - 09/12/19 0001      General Information   Behavior/Cognition  Alert;Cooperative;Pleasant mood;Distractible      Treatment Provided   Treatment provided  Cognitive-Linquistic      Pain Assessment   Pain Assessment  No/denies pain      Cognitive-Linquistic Treatment   Treatment focused on  Aphasia;Dysarthria    Skilled Treatment  Patient's greeting to SLP was WNL. Pt showed SLP child support notice he received in the mail. SLP explained content  of notice, and patient demo'd understanding in 10 min simple-mod complex conversation re: this by asking appropriate follow-up questions re: actions he needed to take. With occasional mod cues, pt drafted an outline of information and questions to ask child support enforcement regarding the notice. When making the call, pt required occasional gestural, verbal cues from SLP for vocal intensity, as well as for language comprehension/expression.  In 5 minutes simple-mod complex conversation re: Medicaid benefits and follow-up instructions with vocational rehabilitation, pt's language was functional with extended time; vocal quality/intensity adequate.      Assessment / Recommendations / Plan   Plan  Discharge SLP treatment due to (comment)      Progression Toward Goals   Progression toward goals  Goals met, education completed, patient discharged from [facility] - 09/12/19 1208    Education Details  vocational rehabilitation, follow-up instructions    Person(s) Educated  Patient    Methods  Explanation    Comprehension  Verbalized understanding       SLP Short Term Goals - 09/10/19 1158      SLP SHORT TERM GOAL #1   Title  Pt will demo selective attention to therapy tasks for 10 minutes x2 sessions.    Baseline  06/11/19; 06/18/19    Time  1    Period  Weeks    Status  Achieved      SLP SHORT TERM GOAL #2   Title  Pt will establish a memory compensation system and bring to 2 therapy sessions.    Baseline  06/27/19, 07/02/19    Time  1    Period  Weeks    Status  Achieved      SLP SHORT TERM GOAL #3   Title  Pt will verbally sequence steps in a functional task (eg recipes) with compensations for aphasia x3 sessions.    Time  1    Period  Weeks    Status  Deferred      SLP SHORT TERM GOAL #4   Title  Pt will demo auditory comprehension of mod complex multistep commands >85% accuracy x 3 visits.    Time  1    Period  Weeks    Status  Not Met      SLP SHORT TERM GOAL #5    Title  Pt will use compensations for aphasia and dysarthria in 8 minutes simple conversation, x 2 visits with rare min A.    Time  1    Period  Weeks    Status  Deferred   for focus on cognition      SLP Long Term Goals - 09/10/19 1158      SLP LONG TERM GOAL #1   Title  Pt will demo alternating attention in a functional task for 15 minutes x2 sessions.    Time  2   or 17 sessions, for all LTGs   Period  Weeks    Status  Deferred      SLP LONG TERM GOAL #2   Title  Pt will use memory compensation system to recall personal, schedule, or appointment information in 3 therapy sessions.    Baseline  07/09/19; 07/17/19; 09/03/19    Time  3    Period  Weeks    Status  Achieved   renewed for 4 weeks     SLP LONG TERM GOAL #3   Title  Pt will engage functionally in 5 minutes simple-mod complex conversation with compensations for aphasia, x2 visits.    Baseline  09/03/19, 09/10/19    Time  3    Period  Weeks    Status  Achieved   renewed for 4 weeks     SLP LONG TERM GOAL #4   Title  Pt will maintain adequate volume and vocal quality in 5 minutes simple conversation x2 visits.    Baseline  09/03/19, 09/10/19    Time  2    Period  Weeks    Status  Achieved   renewed for 4 weeks     SLP LONG TERM GOAL #5   Title  Pt will demo auditory comprehension of 10 minutes simple-mod complex conversation by answering questions or commenting appropriately x 2 visits.    Baseline  09/10/19    Time  3    Period  Weeks    Status  Partially Met   renewed for 4 weeks     SLP LONG TERM GOAL #6   Title  Pt will demo or eport success with phone calls (medical, disability services, personal) with use of compensations over 3 therapy sessions.    Baseline  07/17/19    Time  3    Period  Weeks    Status  Partially Met   renewed for 4 weeks      Plan - 09/12/19 1213  Clinical Impression Statement  [name] presents with moderate cognitive deficits, mild-moderate aphasia, and mild  dysarthria. Pt with functional expression, comprehension, and vocal intensity in 5 min simple-mod complex conversation today. Continues to struggle communicating over the phone and with unfamiliar people. Pt has achieved dysarthria and simple conversation goals; level of assistance required for phone calls/mod complex conversation remains commensurate with previous sessions. Pt in agreement with d/c today as planned; he plans to follow up with Vocational Rehabilitation. Have advised pt he may benefit from ST reassessment in 3-6 months and may request referral at neuro follow-up in January.    Speech Therapy Frequency  --   d/c   Duration  --   d/c   Potential to Achieve Goals  Good    Potential Considerations  Financial resources    Consulted and Agree with Plan of Care  Patient       Patient will benefit from skilled therapeutic intervention in order to improve the following deficits and impairments:   Cognitive communication deficit  Aphasia  Dysarthria and anarthria    Problem List Patient Active Problem List   Diagnosis Date Noted  . Tobacco use 09/09/2019  . Cough 09/09/2019  . Shortness of breath 09/09/2019  . Generalized weakness 09/09/2019  . Right sided weakness 09/09/2019  . Abnormal PFT 08/12/2019  . HLD (hyperlipidemia) 05/23/2019  . CVA (cerebral vascular accident) ([place]) 05/21/2019   SPEECH THERAPY DISCHARGE SUMMARY  Visits from Start of Care: 14  Current functional level related to goals / functional outcomes: Patient reports less frustration communicating with daughter at home; not having to repeat himself as often. Using compensations for aphasia, dysarthria in simple-mod complex conversation (5-10 minutes).    Remaining deficits: Continues to have difficulty communicating over the phone (vocal intensity, expressive/receptive deficits, and pt's accent contribute) and with unfamiliar people. Reading comprehension and writing impaired at sentence level. Cognitive  deficits including memory and attention limiting pt's ability to safely perform his previous job duties.   Education / Equipment: Vocational rehabilitation, # Medicaid visits allowed each year Plan: Patient agrees to discharge.  Patient goals were partially met. Patient is being discharged due to the patient's request.  ?????         Deneise Lever, [place], [place] 09/12/2019, 12:23 PM  [facility] [address] [place], [place], [address] Phone: [phone]   Fax:  [phone]   Name: Brandon Summers MRN: [id number] Date of Birth: 1971-03-05

## 2019-09-12 NOTE — Progress Notes (Signed)
Thank you for your help.

## 2019-09-24 NOTE — Progress Notes (Signed)
Carelink Summary Report / Loop Recorder 

## 2019-10-12 ENCOUNTER — Other Ambulatory Visit: Payer: Self-pay

## 2019-10-12 ENCOUNTER — Encounter (HOSPITAL_COMMUNITY): Payer: Self-pay

## 2019-10-12 ENCOUNTER — Emergency Department (HOSPITAL_COMMUNITY)
Admission: EM | Admit: 2019-10-12 | Discharge: 2019-10-13 | Disposition: A | Discharge status: Home / Self Care | Payer: 59 | Attending: Emergency Medicine | Admitting: Emergency Medicine

## 2019-10-12 DIAGNOSIS — R079 Chest pain, unspecified: Secondary | ICD-10-CM | POA: Diagnosis not present

## 2019-10-12 DIAGNOSIS — Z79899 Other long term (current) drug therapy: Secondary | ICD-10-CM | POA: Diagnosis not present

## 2019-10-12 DIAGNOSIS — K2900 Acute gastritis without bleeding: Secondary | ICD-10-CM | POA: Diagnosis not present

## 2019-10-12 DIAGNOSIS — R1012 Left upper quadrant pain: Secondary | ICD-10-CM | POA: Diagnosis present

## 2019-10-12 DIAGNOSIS — Z87891 Personal history of nicotine dependence: Secondary | ICD-10-CM | POA: Diagnosis not present

## 2019-10-12 DIAGNOSIS — Z7982 Long term (current) use of aspirin: Secondary | ICD-10-CM | POA: Insufficient documentation

## 2019-10-12 DIAGNOSIS — Z95818 Presence of other cardiac implants and grafts: Secondary | ICD-10-CM | POA: Diagnosis not present

## 2019-10-12 NOTE — ED Triage Notes (Signed)
Pt arrived stating over the last 3-4 days he has pain in upper left abdomen towards his heart. States he takes a blood thinner and unsure if he is having a reaction or if it is something else. Denies any NVD or shortness of breath.

## 2019-10-13 ENCOUNTER — Emergency Department (HOSPITAL_COMMUNITY): Payer: 59

## 2019-10-13 DIAGNOSIS — R079 Chest pain, unspecified: Secondary | ICD-10-CM | POA: Diagnosis not present

## 2019-10-13 DIAGNOSIS — K2900 Acute gastritis without bleeding: Secondary | ICD-10-CM | POA: Diagnosis not present

## 2019-10-13 LAB — TROPONIN I (HIGH SENSITIVITY)
Troponin I (High Sensitivity): 4 ng/L (ref <18)
Troponin I (High Sensitivity): 4 ng/L (ref <18)

## 2019-10-13 LAB — CBC
HCT: 49.7 % (ref 39.0–52.0)
Hemoglobin: 15.2 g/dL (ref 13.0–17.0)
MCH: 22.9 pg — ABNORMAL LOW (ref 26.0–34.0)
MCHC: 30.6 g/dL (ref 30.0–36.0)
MCV: 74.8 fL — ABNORMAL LOW (ref 80.0–100.0)
Platelets: 251 10*3/uL (ref 150–400)
RBC: 6.64 MIL/uL — ABNORMAL HIGH (ref 4.22–5.81)
RDW: 17.7 % — ABNORMAL HIGH (ref 11.5–15.5)
WBC: 5.6 10*3/uL (ref 4.0–10.5)
nRBC: 0 % (ref 0.0–0.2)

## 2019-10-13 LAB — BASIC METABOLIC PANEL
Anion gap: 7 (ref 5–15)
BUN: 9 mg/dL (ref 6–20)
CO2: 29 mmol/L (ref 22–32)
Calcium: 9.1 mg/dL (ref 8.9–10.3)
Chloride: 105 mmol/L (ref 98–111)
Creatinine, Ser: 0.97 mg/dL (ref 0.61–1.24)
GFR calc Af Amer: >60 mL/min (ref >60)
GFR calc non Af Amer: >60 mL/min (ref >60)
Glucose, Bld: 113 mg/dL — ABNORMAL HIGH (ref 70–99)
Potassium: 4.3 mmol/L (ref 3.5–5.1)
Sodium: 141 mmol/L (ref 135–145)

## 2019-10-13 MED ORDER — SUCRALFATE 1 G PO TABS: 1.0000 g | ORAL_TABLET | Freq: Three times a day (TID) | ORAL | 0 refills | Status: DC

## 2019-10-13 NOTE — ED Notes (Signed)
X-ray at bedside

## 2019-10-13 NOTE — Discharge Instructions (Addendum)
Take Carafate daily as prescribed. Follow up with your provider to discuss your concerns for use of Aspirin and your stomach pain. Return to the ER for new or worsening symptoms.

## 2019-10-13 NOTE — ED Provider Notes (Signed)
[facility]-EMERGENCY DEPT Provider Note   CSN: [id number] Arrival date & time: 10/12/19  2327     History   Chief Complaint Chief Complaint  Patient presents with  . Abdominal Pain    HPI Brandon Summers is a 48 y.o. male.     48yo male with past medical history of CVA (05/2019), hyperlipidemia, former smoker (quit 05/2019) presents with complaint of pain in his left upper abdominal/left lower chest area.  Pain is intermittent for the past 4 days, occurs at rest, not always has not with exertion, does not radiate.  Described as burning in nature.  Patient takes an aspirin daily, concerned this is what is causing his pain.  Patient denies any associated nausea, vomiting, shortness of breath or diaphoresis. No other complaints or concerns.      Past Medical History:  Diagnosis Date  . Cough   . Generalized weakness   . Shortness of breath   . Stroke (cerebrum) ([facility])   . Tobacco abuse   . Tobacco use   . Visual problems     Patient Active Problem List   Diagnosis Date Noted  . Tobacco use 09/09/2019  . Cough 09/09/2019  . Shortness of breath 09/09/2019  . Generalized weakness 09/09/2019  . Right sided weakness 09/09/2019  . Abnormal PFT 08/12/2019  . HLD (hyperlipidemia) 05/23/2019  . CVA (cerebral vascular accident) ([facility]) 05/21/2019    Past Surgical History:  Procedure Laterality Date  . BUBBLE STUDY  05/23/2019   Procedure: BUBBLE STUDY;  Surgeon: Chrystie NoseHilty, Kenneth C, MD;  [facility];  Service: Cardiovascular;;  . implantable loop recorder placement  07/26/2019   MDT Reveal [id number] implanted by Dr Johney FrameAllred for cryptogenic stroke  . TEE WITHOUT CARDIOVERSION N/A 05/23/2019   Procedure: TRANSESOPHAGEAL ECHOCARDIOGRAM (TEE);  Surgeon: Chrystie NoseHilty, Kenneth C, MD;  Location: [facility] ENDOSCOPY;  Service: Cardiovascular;  Laterality: N/A;        Home Medications    Prior to Admission medications   Medication Sig Start Date End Date Taking? Authorizing  Provider  albuterol (VENTOLIN HFA) 108 (90 Base) MCG/ACT inhaler Inhale 2 puffs into the lungs every 6 (six) hours as needed for wheezing or shortness of breath. 09/12/19  Yes Kallie LocksStroud, Natalie M, FNP  aspirin EC 81 MG tablet Take 1 tablet (81 mg total) by mouth daily. 06/07/19 06/06/20 Yes Mike Gipouglas, Andre, FNP  atorvastatin (LIPITOR) 40 MG tablet Take 1 tablet (40 mg total) by mouth daily at 6 PM. 08/19/19  Yes Jegede, Olugbemiga E, MD  benzonatate (TESSALON) 100 MG capsule Take 1 capsule (100 mg total) by mouth 2 (two) times daily as needed for cough. 09/09/19   Kallie LocksStroud, Natalie M, FNP  sucralfate (CARAFATE) 1 g tablet Take 1 tablet (1 g total) by mouth 4 (four) times daily -  with meals and at bedtime for 7 days. 10/13/19 10/20/19  Jeannie FendMurphy, Jr Mill[place] A, PA-C    Family History Family History  Problem Relation Age of Onset  . Hypertension Mother   . Stroke Mother     Social History Social History   Tobacco Use  . Smoking status: Former Smoker    Types: Cigarettes    Quit date: 05/21/2019    Years since quitting: 0.3  . Smokeless tobacco: Never Used  . Tobacco comment: 1 pack in 4 days - cutting back per pt  Substance Use Topics  . Alcohol use: Yes    Comment: occasional  . Drug use: No     Allergies   Patient  has no known allergies.   Review of Systems Review of Systems  Constitutional: Negative for chills, diaphoresis and fever.  Respiratory: Negative for chest tightness and shortness of breath.   Cardiovascular: Positive for chest pain. Negative for palpitations and leg swelling.  Gastrointestinal: Positive for abdominal pain. Negative for blood in stool, constipation, diarrhea, nausea and vomiting.  Musculoskeletal: Negative for back pain.  Skin: Negative for rash and wound.  Allergic/Immunologic: Negative for immunocompromised state.  Neurological: Negative for weakness and numbness.  Psychiatric/Behavioral: Negative for confusion.  All other systems reviewed and are negative.     Physical Exam Updated Vital Signs BP 122/84   Pulse (!) 57   Temp 98.1 F (36.7 C) (Oral)   Resp 13   SpO2 100%   Physical Exam Vitals signs and nursing note reviewed.  Constitutional:      General: He is not in acute distress.    Appearance: He is well-developed. He is not diaphoretic.  HENT:     Head: Normocephalic and atraumatic.  Cardiovascular:     Rate and Rhythm: Normal rate and regular rhythm.     Heart sounds: Normal heart sounds.  Pulmonary:     Effort: Pulmonary effort is normal.     Breath sounds: Normal breath sounds.  Abdominal:     General: Bowel sounds are normal.     Palpations: Abdomen is soft.     Tenderness: There is no abdominal tenderness.  Skin:    General: Skin is warm and dry.     Findings: No erythema or rash.  Neurological:     Mental Status: He is alert and oriented to person, place, and time.  Psychiatric:        Behavior: Behavior normal.      ED Treatments / Results  Labs (all labs ordered are listed, but only abnormal results are displayed) Labs Reviewed  BASIC METABOLIC PANEL - Abnormal; Notable for the following components:      Result Value   Glucose, Bld 113 (*)    All other components within normal limits  CBC - Abnormal; Notable for the following components:   RBC 6.64 (*)    MCV 74.8 (*)    MCH 22.9 (*)    RDW 17.7 (*)    All other components within normal limits  TROPONIN I (HIGH SENSITIVITY)  TROPONIN I (HIGH SENSITIVITY)  TROPONIN I (HIGH SENSITIVITY)    EKG EKG Interpretation  Date/Time:  Saturday October 12 2019 23:45:24 EST Ventricular Rate:  71 PR Interval:    QRS Duration: 74 QT Interval:  382 QTC Calculation: 416 R Axis:   73 Text Interpretation: Sinus rhythm Probable left atrial enlargement Borderline T wave abnormalities Borderline ST elevation, anterior leads Baseline wander in lead(s) I III aVL aVF V3 V5 No significant change since last tracing Confirmed by Gilda Crease ([id number]) on  10/13/2019 2:13:25 AM   Radiology Dg Chest Port 1 View  Result Date: 10/13/2019 CLINICAL DATA:  Chest pain EXAM: PORTABLE CHEST 1 VIEW COMPARISON:  08/27/2019 FINDINGS: Mild hyperinflation. Heart and mediastinal contours are within normal limits. No focal opacities or effusions. No acute bony abnormality. IMPRESSION: Hyperinflation.  No active cardiopulmonary disease. Electronically Signed   By: Charlett Nose M.D.   On: 10/13/2019 00:37    Procedures Procedures (including critical care time)  Medications Ordered in ED Medications - No data to display   Initial Impression / Assessment and Plan / ED Course  I have reviewed the triage vital signs and the  nursing notes.  Pertinent labs & imaging results that were available during my care of the patient were reviewed by me and considered in my medical decision making (see chart for details).  Clinical Course as of Oct 13 403  Nancy Fetter Oct 12, 2045  2644 48 year old male presents with complaint of left lower chest/left upper abdominal pain.  No associated symptoms.  Patient feels like his pain is caused by the aspirin he takes daily since his stroke.  Work-up is reassuring including negative troponin x2, CBC and BMP without significant findings, chest x-ray, EKG without acute ischemic changes.  On recheck, patient is pain-free.  Patient will like to stop taking his aspirin as he feels this is causing his pain.  Advised patient he can hold his aspirin until he calls his PCP on Monday to further discuss this.  Patient can take Carafate while awaiting follow-up with his PCP.  Return to ER for new or worsening symptoms.   [LM]    Clinical Course User Index [LM] Tacy Learn, PA-C       Final Clinical Impressions(s) / ED Diagnoses   Final diagnoses:  Acute gastritis without hemorrhage, unspecified gastritis type    ED Discharge Orders         Ordered    sucralfate (CARAFATE) 1 g tablet  3 times daily with meals & bedtime     10/13/19  0402           Tacy Learn, PA-C 10/13/19 0405    Orpah Greek, MD 10/13/19 [phone]

## 2019-10-14 ENCOUNTER — Encounter: Payer: Self-pay | Admitting: Family Medicine

## 2019-10-14 ENCOUNTER — Ambulatory Visit (INDEPENDENT_AMBULATORY_CARE_PROVIDER_SITE_OTHER): Payer: Medicaid Other | Admitting: *Deleted

## 2019-10-14 ENCOUNTER — Other Ambulatory Visit: Payer: Self-pay

## 2019-10-14 DIAGNOSIS — I639 Cerebral infarction, unspecified: Secondary | ICD-10-CM | POA: Diagnosis not present

## 2019-10-15 LAB — CUP PACEART REMOTE DEVICE CHECK
Date Time Interrogation Session: 20201116184403
Implantable Pulse Generator Implant Date: 20200828

## 2019-11-08 ENCOUNTER — Telehealth: Payer: Self-pay | Admitting: Family Medicine

## 2019-11-08 ENCOUNTER — Other Ambulatory Visit: Payer: Self-pay

## 2019-11-08 ENCOUNTER — Ambulatory Visit (INDEPENDENT_AMBULATORY_CARE_PROVIDER_SITE_OTHER): Payer: Medicaid Other | Admitting: Family Medicine

## 2019-11-08 ENCOUNTER — Encounter: Payer: Self-pay | Admitting: Family Medicine

## 2019-11-08 VITALS — BP 113/72 | HR 67 | Temp 97.7°F | Ht 72.0 in | Wt 162.4 lb

## 2019-11-08 DIAGNOSIS — R531 Weakness: Secondary | ICD-10-CM | POA: Diagnosis not present

## 2019-11-08 DIAGNOSIS — Z09 Encounter for follow-up examination after completed treatment for conditions other than malignant neoplasm: Secondary | ICD-10-CM

## 2019-11-08 DIAGNOSIS — Z8673 Personal history of transient ischemic attack (TIA), and cerebral infarction without residual deficits: Secondary | ICD-10-CM

## 2019-11-08 DIAGNOSIS — Z0000 Encounter for general adult medical examination without abnormal findings: Secondary | ICD-10-CM | POA: Diagnosis not present

## 2019-11-08 DIAGNOSIS — R1084 Generalized abdominal pain: Secondary | ICD-10-CM

## 2019-11-08 LAB — POCT URINALYSIS DIPSTICK
Bilirubin, UA: NEGATIVE
Blood, UA: NEGATIVE
Glucose, UA: NEGATIVE
Leukocytes, UA: NEGATIVE
Nitrite, UA: NEGATIVE
Protein, UA: NEGATIVE
Spec Grav, UA: >=1.03 — AB (ref 1.010–1.025)
Urobilinogen, UA: 0.2 E.U./dL
pH, UA: 8.5 — AB (ref 5.0–8.0)

## 2019-11-08 NOTE — Patient Instructions (Signed)
Gastritis, Adult ° °Gastritis is swelling (inflammation) of the stomach. Gastritis can develop quickly (acute). It can also develop slowly over time (chronic). It is important to get help for this condition. If you do not get help, your stomach can bleed, and you can get sores (ulcers) in your stomach. °What are the causes? °This condition may be caused by: °· Germs that get to your stomach. °· Drinking too much alcohol. °· Medicines you are taking. °· Too much acid in the stomach. °· A disease of the intestines or stomach. °· Stress. °· An allergic reaction. °· Crohn's disease. °· Some cancer treatments (radiation). °Sometimes the cause of this condition is not known. °What are the signs or symptoms? °Symptoms of this condition include: °· Pain in your stomach. °· A burning feeling in your stomach. °· Feeling sick to your stomach (nauseous). °· Throwing up (vomiting). °· Feeling too full after you eat. °· Weight loss. °· Bad breath. °· Throwing up blood. °· Blood in your poop (stool). °How is this diagnosed? °This condition may be diagnosed with: °· Your medical history and symptoms. °· A physical exam. °· Tests. These can include: °? Blood tests. °? Stool tests. °? A procedure to look inside your stomach (upper endoscopy). °? A test in which a sample of tissue is taken for testing (biopsy). °How is this treated? °Treatment for this condition depends on what caused it. You may be given: °· Antibiotic medicine, if your condition was caused by germs. °· H2 blockers and similar medicines, if your condition was caused by too much acid. °Follow these instructions at home: °Medicines °· Take over-the-counter and prescription medicines only as told by your doctor. °· If you were prescribed an antibiotic medicine, take it as told by your doctor. Do not stop taking it even if you start to feel better. °Eating and drinking ° °· Eat small meals often, instead of large meals. °· Avoid foods and drinks that make your symptoms  worse. °· Drink enough fluid to keep your pee (urine) pale yellow. °Alcohol use °· Do not drink alcohol if: °? Your doctor tells you not to drink. °? You are pregnant, may be pregnant, or are planning to become pregnant. °· If you drink alcohol: °? Limit your use to: °§ 0-1 drink a day for women. °§ 0-2 drinks a day for men. °? Be aware of how much alcohol is in your drink. In the [place] one drink equals one 12 oz bottle of beer (355 mL), one 5 oz glass of wine (148 mL), or one 1½ oz glass of hard liquor (44 mL). °General instructions °· Talk with your doctor about ways to manage stress. You can exercise or do deep breathing, meditation, or yoga. °· Do not smoke or use products that have nicotine or tobacco. If you need help quitting, ask your doctor. °· Keep all follow-up visits as told by your doctor. This is important. °Contact a doctor if: °· Your symptoms get worse. °· Your symptoms go away and then come back. °Get help right away if: °· You throw up blood or something that looks like coffee grounds. °· You have black or dark red poop. °· You throw up any time you try to drink fluids. °· Your stomach pain gets worse. °· You have a fever. °· You do not feel better after one week. °Summary °· Gastritis is swelling (inflammation) of the stomach. °· You must get help for this condition. If you do not get help, your stomach   can bleed, and you can get sores (ulcers). °· This condition is diagnosed with medical history, physical exam, or tests. °· You can be treated with medicines for germs or medicines to block too much acid in your stomach. °This information is not intended to replace advice given to you by your health care provider. Make sure you discuss any questions you have with your health care provider. °Document Released: 05/02/2008 Document Revised: 04/03/2018 Document Reviewed: 04/03/2018 °Elsevier Patient Education © 2020 [employer] ° °

## 2019-11-08 NOTE — Telephone Encounter (Signed)
Attempted to contact patient's wife with questions and concerns about today's office visit.

## 2019-11-08 NOTE — Progress Notes (Signed)
Patient [facility] Follow Up  Subjective:  Patient ID: Brandon Summers, male    DOB: January 23, 1971  Age: 48 y.o. MRN: [id number]  CC:  Chief Complaint  Patient presents with  . Hospitalization Follow-up    10/13/2019 ED Gastritis   . Constipation    HPI Brandon Summers is a 48 year old male who presents for Hospital Follow Up today.   Past Medical History:  Diagnosis Date  . Cough   . Generalized weakness   . Shortness of breath   . Stroke (cerebrum) ([place])   . Tobacco abuse   . Tobacco use   . Visual problems    Current Status: Since his last office visit, he has had ED visit on 10/12/2019 for Acute Gastritis. He has c/o constipation and abdominal pain after using bathroom. Denies GI problems such as nausea, vomiting, diarrhea, and constipation.He denies fevers, chills, fatigue, recent infections, weight loss, and night sweats. He has not had any headaches, visual changes, dizziness, and falls. No chest pain, heart palpitations, cough and shortness of breath reported.  He has no reports of blood in stools, dysuria and hematuria. No depression or anxiety reported today. He denies suicidal ideations, homicidal ideations, or auditory hallucinations. Patient is requesting for me to contact his wife today, who has additional questions and concerns.   Past Surgical History:  Procedure Laterality Date  . BUBBLE STUDY  05/23/2019   Procedure: BUBBLE STUDY;  Surgeon: Pixie Casino, MD;  Location: [facility] ENDOSCOPY;  Service: Cardiovascular;;  . implantable loop recorder placement  07/26/2019   MDT Reveal [id number] implanted by Dr Rayann Heman for cryptogenic stroke  . TEE WITHOUT CARDIOVERSION N/A 05/23/2019   Procedure: TRANSESOPHAGEAL ECHOCARDIOGRAM (TEE);  Surgeon: Pixie Casino, MD;  Location: [facility] ENDOSCOPY;  Service: Cardiovascular;  Laterality: N/A;    Family History  Problem Relation Age of Onset  . Hypertension Mother   . Stroke Mother      Social History   Socioeconomic History  . Marital status: Married    Spouse name: Not on file  . Number of children: Not on file  . Years of education: Not on file  . Highest education level: Not on file  Occupational History  . Not on file  Tobacco Use  . Smoking status: Former Smoker    Types: Cigarettes    Quit date: 05/21/2019    Years since quitting: 0.4  . Smokeless tobacco: Never Used  . Tobacco comment: 1 pack in 4 days - cutting back per pt  Substance and Sexual Activity  . Alcohol use: Yes    Comment: occasional  . Drug use: No  . Sexual activity: Not Currently  Other Topics Concern  . Not on file  Social History Narrative   Marital status: separated; moved from [place] to [place] 1998   Children: 2 children; no grandchildren.   Employment: chef   Tobacco: 1/2ppd   Alcohol: none   Drugs: none   Exercise: none.    no Caffeine   Social Determinants of Health   Financial Resource Strain:   . Difficulty of Paying Living Expenses: Not on file  Food Insecurity:   . Worried About Charity fundraiser in the Last Year: Not on file  . Ran Out of Food in the Last Year: Not on file  Transportation Needs:   . Lack of Transportation (Medical): Not on file  . Lack of Transportation (Non-Medical): Not on file  Physical Activity:   .  Days of Exercise per Week: Not on file  . Minutes of Exercise per Session: Not on file  Stress:   . Feeling of Stress : Not on file  Social Connections:   . Frequency of Communication with Friends and Family: Not on file  . Frequency of Social Gatherings with Friends and Family: Not on file  . Attends Religious Services: Not on file  . Active Member of Clubs or Organizations: Not on file  . Attends BankerClub or Organization Meetings: Not on file  . Marital Status: Not on file  Intimate Partner Violence:   . Fear of Current or Ex-Partner: Not on file  . Emotionally Abused: Not on file  . Physically Abused: Not on file  . Sexually Abused: Not on  file    Outpatient Medications Prior to Visit  Medication Sig Dispense Refill  . albuterol (VENTOLIN HFA) 108 (90 Base) MCG/ACT inhaler Inhale 2 puffs into the lungs every 6 (six) hours as needed for wheezing or shortness of breath. 8 g 11  . aspirin EC 81 MG tablet Take 1 tablet (81 mg total) by mouth daily. 90 tablet 3  . atorvastatin (LIPITOR) 40 MG tablet Take 1 tablet (40 mg total) by mouth daily at 6 PM. 90 tablet 3  . benzonatate (TESSALON) 100 MG capsule Take 1 capsule (100 mg total) by mouth 2 (two) times daily as needed for cough. 20 capsule 0  . sucralfate (CARAFATE) 1 g tablet Take 1 tablet (1 g total) by mouth 4 (four) times daily -  with meals and at bedtime for 7 days. 28 tablet 0   No facility-administered medications prior to visit.    No Known Allergies  ROS Review of Systems  Constitutional: Negative.   HENT: Negative.   Eyes: Negative.   Respiratory: Negative.   Cardiovascular: Negative.   Gastrointestinal: Positive for abdominal pain.  Genitourinary: Negative.   Musculoskeletal: Negative.   Skin: Negative.   Allergic/Immunologic: Negative.   Neurological: Negative.   Hematological: Negative.   Psychiatric/Behavioral: Negative.       Objective:    Physical Exam  Constitutional: He is oriented to person, place, and time. He appears well-developed and well-nourished.  HENT:  Head: Normocephalic and atraumatic.  Eyes: Conjunctivae are normal.  Cardiovascular: Normal rate, regular rhythm, normal heart sounds and intact distal pulses.  Pulmonary/Chest: Effort normal and breath sounds normal.  Abdominal: Soft. Bowel sounds are normal.  Musculoskeletal:        General: Normal range of motion.     Cervical back: Normal range of motion and neck supple.  Neurological: He is alert and oriented to person, place, and time. He has normal reflexes.  Skin: Skin is warm and dry.  Psychiatric: He has a normal mood and affect. His behavior is normal. Judgment and  thought content normal.  Nursing note and vitals reviewed.   BP 113/72   Pulse 67   Temp 97.7 F (36.5 C) (Oral)   Ht 6' (1.829 m)   Wt 162 lb 6.4 oz (73.7 kg)   SpO2 97%   BMI 22.03 kg/m  Wt Readings from Last 3 Encounters:  11/08/19 162 lb 6.4 oz (73.7 kg)  09/09/19 164 lb (74.4 kg)  08/12/19 160 lb 6.4 oz (72.8 kg)     Health Maintenance Due  Topic Date Due  . HIV Screening  11/28/1986  . TETANUS/TDAP  11/28/1990  . INFLUENZA VACCINE  06/29/2019    There are no preventive care reminders to display for this patient.  No results found for: TSH Lab Results  Component Value Date   WBC 5.6 10/13/2019   HGB 15.2 10/13/2019   HCT 49.7 10/13/2019   MCV 74.8 (L) 10/13/2019   PLT 251 10/13/2019   Lab Results  Component Value Date   NA 141 10/13/2019   K 4.3 10/13/2019   CO2 29 10/13/2019   GLUCOSE 113 (H) 10/13/2019   BUN 9 10/13/2019   CREATININE 0.97 10/13/2019   BILITOT 0.5 05/21/2019   ALKPHOS 52 05/21/2019   AST 24 05/21/2019   ALT 44 05/21/2019   PROT 6.0 (L) 05/21/2019   ALBUMIN 3.3 (L) 05/21/2019   CALCIUM 9.1 10/13/2019   ANIONGAP 7 10/13/2019   Lab Results  Component Value Date   CHOL 170 05/22/2019   Lab Results  Component Value Date   HDL 44 05/22/2019   Lab Results  Component Value Date   LDLCALC 114 (H) 05/22/2019   Lab Results  Component Value Date   TRIG 61 05/22/2019   Lab Results  Component Value Date   CHOLHDL 3.9 05/22/2019   Lab Results  Component Value Date   HGBA1C 5.7 (H) 05/22/2019      Assessment & Plan:   1. Hospital discharge follow-up  2. Generalized abdominal pain Mild. Monitor.   3. History of CVA (cerebrovascular accident) No signs or symptoms of recurrence noted or reported today.   4. Generalized weakness Stable.   5. Healthcare maintenance - Urinalysis Dipstick  6. Follow up He will follow up in 3 months.  - Urinalysis Dipstick  No orders of the defined types were placed in this encounter.    Orders Placed This Encounter  Procedures  . Urinalysis Dipstick    Referral Orders  No referral(s) requested today    Raliegh Ip,  MSN, FNP-BC [facility] [address] [place], [place] [address] [phone]- fax  Problem List Items Addressed This Visit      Other   Generalized weakness    Other Visit Diagnoses    Hospital discharge follow-up    -  Primary   Generalized abdominal pain       History of CVA (cerebrovascular accident)       Healthcare maintenance       Relevant Orders   Urinalysis Dipstick (Completed)   Follow up       Relevant Orders   Urinalysis Dipstick (Completed)      No orders of the defined types were placed in this encounter.   Follow-up: Return in about 3 months (around 02/06/2020).    Kallie Locks, FNP

## 2019-11-09 NOTE — Progress Notes (Signed)
Carelink Summary Report / Loop Recorder 

## 2019-11-11 ENCOUNTER — Telehealth: Payer: Self-pay | Admitting: Family Medicine

## 2019-11-11 NOTE — Telephone Encounter (Signed)
-----   Message from Azzie Glatter, [place] sent at 11/10/2019  8:28 PM EST ----- Regarding: "Follow Up" Please schedule patient for follow up in 3 months. Thank you.

## 2019-11-11 NOTE — Telephone Encounter (Signed)
Call left a message for Pt to call office to set up a 3 month follow up

## 2019-11-13 ENCOUNTER — Encounter: Payer: Self-pay | Admitting: Occupational Therapy

## 2019-11-13 NOTE — Therapy (Signed)
[facility] [address] [place], [place], [address] Phone: [phone]   Fax:  [phone]  Patient Details  Name: Brandon Summers MRN: [id number] Date of Birth: [birth year] Referring Provider:  No ref. provider found  Encounter Date: 11/13/2019   OCCUPATIONAL THERAPY DISCHARGE SUMMARY  Visits from Start of Care: 9    Current functional level related to goals / functional outcomes: OT Short Term Goals - 08/20/19 1400      OT SHORT TERM GOAL #1   Title  Pt/caregiver will verbalize understanding of cognitive compensation strategies for incr safety/independence with ADLs/IADLs.--check STGs 06/29/19    Baseline  dependent    Time  4    Period  Weeks    Status  Deferred   Partially met as Issued/addressed by ST and issued/reviewed by OT 08/20/19 and educated pt in safety strategies with cooking     OT SHORT TERM GOAL #2   Title  Pt will perform simple cooking task mod I.    Baseline  dependent/has not attempted    Time  4    Period  Weeks    Status  Not Met   supervision, min-mod v.c     08/20/19:  supervision, min-mod cueing.     OT SHORT TERM GOAL #3   Title  Pt will perform simple home maintenance task mod I.    Baseline  dependent/has not attempted    Time  4    Period  Weeks    Status  Partially Met   08/13/19:   pt able to perform safely in clinic, but is not performing at home due to home situation per pt report     OT SHORT TERM GOAL #4   Title  Pt will perform simple functional organization/problem solving tasks with at least 90% accuracy.    Time  4    Period  Weeks    Status  Not met   grossly 75-80% today, 08/13/19:  approx 75-80% accuracy     OT Long Term Goals - 08/20/19 1403      OT LONG TERM GOAL #1   Title  Pt will perform simple alternating-divided attention activity (at least 1 cognitive and 1 physical task) with at least 90% accuracy for incr safety/in prep for return to work.    Baseline  pt  reports difficulty with attending to simple physical task    Time  8    Period  Weeks    Status  Not Met   08/20/19:  pt with slow progressing and unable to continue physical task with cognitive task     OT LONG TERM GOAL #2   Title  Pt will perform mod complex cooking task mod I.    Baseline  dependent/has not attempted    Time  8    Period  Weeks    Status  Not Met      OT LONG TERM GOAL #3   Title  Pt will perform mod complex home maintenance task mod I.    Baseline  dependent/has not attempted    Time  8    Period  Weeks    Status  Not Met   08/20/19:  is not performing     OT LONG TERM GOAL #4   Title  Pt will perform previous financial management tasks with supervision.    Baseline  needs assist    Time  8    Period  Weeks    Status  not  met        Remaining deficits: Cognitive deficits (pt with limited progress)   Education / Equipment: Pt educated in cognitive compensation strategies, activities for home/safety precautions, Vocational Rehab info and verbalized understanding of education provided. Plan: Patient agrees to discharge.  Patient goals were not met. Patient is being discharged due to not returning since the last visit.  Pt was only scheduled for 1 additional visit, but pt canceled last visit due to medical issue and did not reschedule/return. ?????        [facility] 11/13/2019, 3:14 PM  [facility] [address] [place], [place], [address] Phone: [phone]   Fax:  [place], OTR/L [facility] [address] [place], [place]  [address] [phone] phone [phone] 11/13/19 3:14 PM

## 2019-11-18 ENCOUNTER — Ambulatory Visit (INDEPENDENT_AMBULATORY_CARE_PROVIDER_SITE_OTHER): Payer: Medicaid Other | Admitting: *Deleted

## 2019-11-18 ENCOUNTER — Other Ambulatory Visit: Payer: Self-pay

## 2019-11-18 ENCOUNTER — Other Ambulatory Visit (INDEPENDENT_AMBULATORY_CARE_PROVIDER_SITE_OTHER): Payer: 59

## 2019-11-18 DIAGNOSIS — I639 Cerebral infarction, unspecified: Secondary | ICD-10-CM

## 2019-11-18 LAB — LIPID PANEL
Cholesterol: 108 mg/dL (ref 0–200)
HDL: 45.1 mg/dL (ref >39.00)
LDL Cholesterol: 46 mg/dL (ref 0–99)
NonHDL: 62.59
Total CHOL/HDL Ratio: 2
Triglycerides: 83 mg/dL (ref 0.0–149.0)
VLDL: 16.6 mg/dL (ref 0.0–40.0)

## 2019-11-18 LAB — CUP PACEART REMOTE DEVICE CHECK
Date Time Interrogation Session: 20201219134018
Implantable Pulse Generator Implant Date: 20200828

## 2019-11-20 ENCOUNTER — Telehealth: Payer: Self-pay | Admitting: Neurology

## 2019-11-20 NOTE — Telephone Encounter (Signed)
Please advise 

## 2019-11-20 NOTE — Telephone Encounter (Addendum)
Patient is requesting a letter from Dr. Tomi Likens documenting that the patient has had a previous stroke, has vision problems as a result, and cannot work. He is making an appeal to [employer].   Patient left a copy of a printout of the order from [facility] for Dr. Tomi Likens to review, if needed.  Medical records release form completed and sent to Medical Records Department. FYI only.

## 2019-11-20 NOTE — Telephone Encounter (Signed)
yes

## 2019-11-20 NOTE — Telephone Encounter (Signed)
He will need to make a follow up appointment first.

## 2019-11-20 NOTE — Telephone Encounter (Signed)
Please schedule an appt - thanks!

## 2019-11-20 NOTE — Telephone Encounter (Signed)
Patient is already scheduled on 12/06/2019 at 2:50 PM. Will this appointment be soon enough?

## 2019-11-20 NOTE — Telephone Encounter (Signed)
Patient is aware and expressed understanding

## 2019-11-27 ENCOUNTER — Ambulatory Visit: Payer: PRIVATE HEALTH INSURANCE | Admitting: Neurology

## 2019-11-29 DIAGNOSIS — E559 Vitamin D deficiency, unspecified: Secondary | ICD-10-CM

## 2019-11-29 HISTORY — DX: Vitamin D deficiency, unspecified: E55.9

## 2019-12-04 ENCOUNTER — Encounter: Payer: Self-pay | Admitting: Neurology

## 2019-12-05 NOTE — Progress Notes (Signed)
Virtual Visit via Video Note The purpose of this virtual visit is to provide medical care while limiting exposure to the novel coronavirus.    Consent was obtained for video visit:  Yes.   Answered questions that patient had about telehealth interaction:  Yes.   I discussed the limitations, risks, security and privacy concerns of performing an evaluation and management service by telemedicine. I also discussed with the patient that there may be a patient responsible charge related to this service. The patient expressed understanding and agreed to proceed.  Pt location: Home Physician Location: office Name of referring provider:  Mike Gip, FNP I connected with Brandon Summers at patients initiation/request on 12/06/2019 at  2:50 PM EST by video enabled telemedicine application and verified that I am speaking with the correct person using two identifiers. Pt MRN:  [id number] Pt DOB:  [birth year] Video Participants:  Brandon Summers   History of Present Illness:  Brandon Summers is a 49 year old right-handed black man, smoker, who follows up for CVA.  UPDATE: Current medications:  ASA 81mg  daily; Lipitor 40mg  daily  Since last visit, he has had a loop recorder implanted.   He has continued going to speech and occupational rehab.  It stopped for now but he will resume after January.  Speech is still slow. He has trouble with his vision.  He has trouble concentrating.  He still feels weak on his right side.  He is a and can no longer work.   He says he sometimes has abdominal pain.  He is requesting a letter documenting his diagnosis and deficits that explain why he cannot work.  HISTORY: He was admitted to [facility] on [facility]/21[facility]19 after presenting with blurred vision, headache and right sided numbness.  CT of head revealed no acute stroke.  MRI of brain, however, showed an acute large left thalamic infarct as well as significant chronic small vessel ischemic changes.  CTA of head and  neck revealed no aneurysm or emergent large vessel stenosis or occlusion. 2D echo demonstrated EF 55-60% with no cardiac source of embolus.  TEE demonstrated no PFO or other cardiac source of embolus.  LDL was 114 and Hgb A1c was 5.7.  He was discharged on ASA 81mg  and Plavix 75mg  daily for 3 weeks, followed by ASA alone.  He was started on Lipitor 40mg  daily.  He was advised to stop smoking.    Following discharge, he reported some blurred vision.  Right hand hurts but all extremities feel "tired".  He reports that he is drooling from both sides of his face.  He says face feels "different" but he denies numbness and tingling sensation.  He says when he talks, he tends to talk slowly because if he talks, he gets a left sided headache.  He has cut down on cigarettes, now smoking 1 pack over 4 days.  Seen by cardiology to have loop recorder insertion  Past Medical History: Past Medical History:  Diagnosis Date  . Cough   . Generalized weakness   . Shortness of breath   . Stroke (cerebrum) ([facility])   . Tobacco abuse   . Tobacco use   . Visual problems     Medications: Outpatient Encounter Medications as of 12/06/2019  Medication Sig Note  . albuterol (VENTOLIN HFA) 108 (90 Base) MCG/ACT inhaler Inhale 2 puffs into the lungs every 6 (six) hours as needed for wheezing or shortness of breath. 11/08/2019: As needed.   aspirin EC 81 MG  tablet Take 1 tablet (81 mg total) by mouth daily.   Marland Kitchen atorvastatin (LIPITOR) 40 MG tablet Take 1 tablet (40 mg total) by mouth daily at 6 PM.   . benzonatate (TESSALON) 100 MG capsule Take 1 capsule (100 mg total) by mouth 2 (two) times daily as needed for cough.   . sucralfate (CARAFATE) 1 g tablet Take 1 tablet (1 g total) by mouth 4 (four) times daily -  with meals and at bedtime for 7 days.    No facility-administered encounter medications on file as of 12/06/2019.    Allergies: No Known Allergies  Family History: Family History  Problem Relation Age of Onset   . Hypertension Mother   . Stroke Mother     Social History: Social History   Socioeconomic History  . Marital status: Married    Spouse name: Not on file  . Number of children: Not on file  . Years of education: Not on file  . Highest education level: Not on file  Occupational History  . Not on file  Tobacco Use  . Smoking status: Former Smoker    Types: Cigarettes    Quit date: 05/21/2019    Years since quitting: 0.5  . Smokeless tobacco: Never Used  . Tobacco comment: 1 pack in 4 days - cutting back per pt  Substance and Sexual Activity  . Alcohol use: Yes    Comment: occasional  . Drug use: No  . Sexual activity: Not Currently  Other Topics Concern  . Not on file  Social History Narrative   Marital status: separated; moved from [place] to [place] 1998   Children: 2 children; no grandchildren.   Employment: chef   Tobacco: 1/2ppd   Alcohol: none   Drugs: none   Exercise: none.    no Caffeine   Social Determinants of Health   Financial Resource Strain:   . Difficulty of Paying Living Expenses: Not on file  Food Insecurity:   . Worried About Charity fundraiser in the Last Year: Not on file  . Ran Out of Food in the Last Year: Not on file  Transportation Needs:   . Lack of Transportation (Medical): Not on file  . Lack of Transportation (Non-Medical): Not on file  Physical Activity:   . Days of Exercise per Week: Not on file  . Minutes of Exercise per Session: Not on file  Stress:   . Feeling of Stress : Not on file  Social Connections:   . Frequency of Communication with Friends and Family: Not on file  . Frequency of Social Gatherings with Friends and Family: Not on file  . Attends Religious Services: Not on file  . Active Member of Clubs or Organizations: Not on file  . Attends Archivist Meetings: Not on file  . Marital Status: Not on file  Intimate Partner Violence:   . Fear of Current or Ex-Partner: Not on file  . Emotionally Abused: Not on  file  . Physically Abused: Not on file  . Sexually Abused: Not on file    Observations/Objective:   Height 6' (1.829 m), weight 162 lb (73.5 kg). No acute distress.  Alert and oriented.  Speech fluent and not dysarthric.  Language intact.  Eyes orthophoric on primary gaze.  Face symmetric.  Assessment and Plan:   1.  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  Hyperlipidemia 3.  Tobacco use disorder  He continues to have aphasia, right sided numbness/neglect,  visual deficits and difficulty with concentration as result of his left thalamic stroke.  I do not believe he will be able to work in his profession.  I have asked that he contact Child Support Services to send me a letter or form specifically stating what information they need from me and I will prepare that letter.  1.  Continue secondary stroke prevention as managed by PCP  - ASA 81mg  daily for secondary stroke prevention.  - atorvastatin (LDL goal less than 70) 2.  Agree that he should continue Speech/OT/PT. 3.  I will order neuropsychological testing 4.  Follow up in 6 months.   Follow Up Instructions:    -I discussed the assessment and treatment plan with the patient. The patient was provided an opportunity to ask questions and all were answered. The patient agreed with the plan and demonstrated an understanding of the instructions.   The patient was advised to call back or seek an in-person evaluation if the symptoms worsen or if the condition fails to improve as anticipated.    Total Time spent in visit with the patient was:  15 minutes.  , DO

## 2019-12-06 ENCOUNTER — Other Ambulatory Visit: Payer: Self-pay

## 2019-12-06 ENCOUNTER — Telehealth (INDEPENDENT_AMBULATORY_CARE_PROVIDER_SITE_OTHER): Payer: Medicaid Other | Admitting: Neurology

## 2019-12-06 ENCOUNTER — Ambulatory Visit: Payer: PRIVATE HEALTH INSURANCE | Admitting: Neurology

## 2019-12-06 VITALS — Ht 72.0 in | Wt 162.0 lb

## 2019-12-06 DIAGNOSIS — I69320 Aphasia following cerebral infarction: Secondary | ICD-10-CM

## 2019-12-06 DIAGNOSIS — I6381 Other cerebral infarction due to occlusion or stenosis of small artery: Secondary | ICD-10-CM

## 2019-12-06 DIAGNOSIS — I639 Cerebral infarction, unspecified: Secondary | ICD-10-CM

## 2019-12-23 ENCOUNTER — Ambulatory Visit (INDEPENDENT_AMBULATORY_CARE_PROVIDER_SITE_OTHER): Payer: Medicaid Other | Admitting: *Deleted

## 2019-12-23 DIAGNOSIS — I639 Cerebral infarction, unspecified: Secondary | ICD-10-CM

## 2019-12-23 LAB — CUP PACEART REMOTE DEVICE CHECK
Date Time Interrogation Session: 20210124230113
Implantable Pulse Generator Implant Date: 20200828

## 2020-01-27 ENCOUNTER — Ambulatory Visit (INDEPENDENT_AMBULATORY_CARE_PROVIDER_SITE_OTHER): Payer: Medicaid Other | Admitting: *Deleted

## 2020-01-27 DIAGNOSIS — I639 Cerebral infarction, unspecified: Secondary | ICD-10-CM

## 2020-01-27 LAB — CUP PACEART REMOTE DEVICE CHECK
Date Time Interrogation Session: 20210228230453
Implantable Pulse Generator Implant Date: 20200828

## 2020-01-28 NOTE — Progress Notes (Signed)
ILR Remote 

## 2020-02-04 DIAGNOSIS — H00015 Hordeolum externum left lower eyelid: Secondary | ICD-10-CM | POA: Diagnosis not present

## 2020-02-04 DIAGNOSIS — H00012 Hordeolum externum right lower eyelid: Secondary | ICD-10-CM | POA: Diagnosis not present

## 2020-02-07 ENCOUNTER — Telehealth: Payer: Self-pay | Admitting: Family Medicine

## 2020-02-07 NOTE — Telephone Encounter (Signed)
Called pt. No answer. Left message to remind them of appointment

## 2020-02-10 ENCOUNTER — Encounter: Payer: Self-pay | Admitting: Family Medicine

## 2020-02-10 ENCOUNTER — Ambulatory Visit (INDEPENDENT_AMBULATORY_CARE_PROVIDER_SITE_OTHER): Payer: Medicaid Other | Admitting: Family Medicine

## 2020-02-10 ENCOUNTER — Other Ambulatory Visit: Payer: Self-pay

## 2020-02-10 VITALS — BP 120/77 | HR 70 | Ht 72.0 in | Wt 172.2 lb

## 2020-02-10 DIAGNOSIS — R2990 Unspecified symptoms and signs involving the nervous system: Secondary | ICD-10-CM

## 2020-02-10 NOTE — Progress Notes (Signed)
Patient & Ex-Wife present:   Patient c/o stroke like symptoms: Problem with vision, headaches, right-side weakness, not able to speak. Per wife symptoms has been going on for weeks.   Patient directed by Raliegh Ip NP to report to the ED.

## 2020-03-02 ENCOUNTER — Telehealth: Payer: Self-pay | Admitting: Neurology

## 2020-03-02 ENCOUNTER — Ambulatory Visit (INDEPENDENT_AMBULATORY_CARE_PROVIDER_SITE_OTHER): Payer: 59 | Admitting: *Deleted

## 2020-03-02 DIAGNOSIS — I639 Cerebral infarction, unspecified: Secondary | ICD-10-CM

## 2020-03-02 NOTE — Telephone Encounter (Signed)
Pt wants to know if it is ok for him to get the Covid Shot please call

## 2020-03-02 NOTE — Telephone Encounter (Signed)
Left message to contact [facility]

## 2020-03-03 LAB — CUP PACEART REMOTE DEVICE CHECK
Date Time Interrogation Session: 20210331230501
Implantable Pulse Generator Implant Date: 20200828

## 2020-03-13 DIAGNOSIS — H52223 Regular astigmatism, bilateral: Secondary | ICD-10-CM | POA: Diagnosis not present

## 2020-03-13 DIAGNOSIS — H524 Presbyopia: Secondary | ICD-10-CM | POA: Diagnosis not present

## 2020-03-13 DIAGNOSIS — H5203 Hypermetropia, bilateral: Secondary | ICD-10-CM | POA: Diagnosis not present

## 2020-04-02 LAB — CUP PACEART REMOTE DEVICE CHECK
Date Time Interrogation Session: 20210501230615
Implantable Pulse Generator Implant Date: 20200828

## 2020-04-06 ENCOUNTER — Ambulatory Visit (INDEPENDENT_AMBULATORY_CARE_PROVIDER_SITE_OTHER): Payer: Medicaid Other | Admitting: *Deleted

## 2020-04-06 DIAGNOSIS — I639 Cerebral infarction, unspecified: Secondary | ICD-10-CM | POA: Diagnosis not present

## 2020-04-07 NOTE — Progress Notes (Signed)
Carelink Summary Report / Loop Recorder 

## 2020-04-14 ENCOUNTER — Other Ambulatory Visit: Payer: Self-pay

## 2020-04-14 ENCOUNTER — Encounter: Payer: Self-pay | Admitting: Family Medicine

## 2020-04-14 ENCOUNTER — Ambulatory Visit (INDEPENDENT_AMBULATORY_CARE_PROVIDER_SITE_OTHER): Payer: 59 | Admitting: Family Medicine

## 2020-04-14 VITALS — BP 117/72 | HR 80 | Temp 98.3°F | Ht 72.0 in | Wt 178.2 lb

## 2020-04-14 DIAGNOSIS — R531 Weakness: Secondary | ICD-10-CM

## 2020-04-14 DIAGNOSIS — Z0000 Encounter for general adult medical examination without abnormal findings: Secondary | ICD-10-CM | POA: Diagnosis not present

## 2020-04-14 DIAGNOSIS — R519 Headache, unspecified: Secondary | ICD-10-CM | POA: Diagnosis not present

## 2020-04-14 DIAGNOSIS — M79604 Pain in right leg: Secondary | ICD-10-CM

## 2020-04-14 DIAGNOSIS — Z8673 Personal history of transient ischemic attack (TIA), and cerebral infarction without residual deficits: Secondary | ICD-10-CM

## 2020-04-14 DIAGNOSIS — Z09 Encounter for follow-up examination after completed treatment for conditions other than malignant neoplasm: Secondary | ICD-10-CM

## 2020-04-14 LAB — POCT URINALYSIS DIPSTICK
Bilirubin, UA: NEGATIVE
Blood, UA: NEGATIVE
Glucose, UA: NEGATIVE
Ketones, UA: NEGATIVE
Leukocytes, UA: NEGATIVE
Nitrite, UA: NEGATIVE
Protein, UA: NEGATIVE
Spec Grav, UA: 1.025 (ref 1.010–1.025)
Urobilinogen, UA: 0.2 E.U./dL
pH, UA: 6 (ref 5.0–8.0)

## 2020-04-14 MED ORDER — GABAPENTIN 300 MG PO CAPS: 300.0000 mg | ORAL_CAPSULE | Freq: Two times a day (BID) | ORAL | 6 refills | Status: DC

## 2020-04-14 MED ORDER — TOPIRAMATE 50 MG PO TABS: 50.0000 mg | ORAL_TABLET | Freq: Two times a day (BID) | ORAL | 6 refills | Status: DC

## 2020-04-14 NOTE — Progress Notes (Signed)
[facility]    Established Patient Office Visit  Subjective:  Patient ID: Brandon Summers, male    DOB: 19-Apr-1971  Age: 49 y.o. MRN: [id number]  CC:  Chief Complaint  Patient presents with  . Follow-up  . Headache    HPI Brandon Summers is a 49 year old male who presents for Follow Up today.   Past Medical History:  Diagnosis Date  . Anxiety   . Cough   . Generalized weakness   . Pacemaker   . Shortness of breath   . Stroke (cerebrum) (HCC)   . Tobacco abuse   . Tobacco use   . Visual problems    Patient Active Problem List   Diagnosis Date Noted  . Tobacco use 09/09/2019  . Cough 09/09/2019  . Shortness of breath 09/09/2019  . Generalized weakness 09/09/2019  . Right sided weakness 09/09/2019  . Abnormal PFT 08/12/2019  . HLD (hyperlipidemia) 05/23/2019  . CVA (cerebral vascular accident) (HCC) 05/21/2019   Current Status: Since his last office visit, se has c/o chronic headaches today. He is currently taking Acetaminophen for headache. He continues to have right-sided weakness. His anxiety is mild today. He has appointment with Neuropsych on 05/12/2020. He denies suicidal ideations, homicidal ideations, or auditory hallucinations. He denies fevers, chills, fatigue, recent infections, weight loss, and night sweats. He has not had any headaches, visual changes, dizziness, and falls. No chest pain, heart palpitations, cough and shortness of breath reported. Denies GI problems such as nausea, vomiting, diarrhea, and constipation. He has no reports of blood in stools, dysuria and hematuria. He is takng all medications as prescribed. He denies pain today.   Past Surgical History:  Procedure Laterality Date  . BUBBLE STUDY  05/23/2019   Procedure: BUBBLE STUDY;  Surgeon: Chrystie Nose, MD;  Location: [facility] ENDOSCOPY;  Service: Cardiovascular;;  . implantable loop recorder placement  07/26/2019   MDT Reveal [id number] implanted by Dr  Johney Frame for cryptogenic stroke  . TEE WITHOUT CARDIOVERSION N/A 05/23/2019   Procedure: TRANSESOPHAGEAL ECHOCARDIOGRAM (TEE);  Surgeon: Chrystie Nose, MD;  Location: [facility] ENDOSCOPY;  Service: Cardiovascular;  Laterality: N/A;    Family History  Problem Relation Age of Onset  . Hypertension Mother   . Stroke Mother     Social History   Socioeconomic History  . Marital status: Married    Spouse name: Not on file  . Number of children: Not on file  . Years of education: Not on file  . Highest education level: Not on file  Occupational History  . Not on file  Tobacco Use  . Smoking status: Former Smoker    Types: Cigarettes    Quit date: 05/21/2019    Years since quitting: 0.9  . Smokeless tobacco: Never Used  . Tobacco comment: 1 pack in 4 days - cutting back per pt  Substance and Sexual Activity  . Alcohol use: Yes    Comment: occasional  . Drug use: No  . Sexual activity: Not Currently  Other Topics Concern  . Not on file  Social History Narrative   Marital status: separated; moved from [place] to [place] 1998   Children: 2 children; no grandchildren.   Employment: chef   Tobacco: 1/2ppd   Alcohol: none   Drugs: none   Exercise: none.    no Caffeine   Social Determinants of Health   Financial Resource Strain:   . Difficulty of Paying Living Expenses:   Food Insecurity:   .  Worried About Programme researcher, broadcasting/film/video in the Last Year:   . Barista in the Last Year:   Transportation Needs:   . Freight forwarder (Medical):   Marland Kitchen Lack of Transportation (Non-Medical):   Physical Activity:   . Days of Exercise per Week:   . Minutes of Exercise per Session:   Stress:   . Feeling of Stress :   Social Connections:   . Frequency of Communication with Friends and Family:   . Frequency of Social Gatherings with Friends and Family:   . Attends Religious Services:   . Active Member of Clubs or Organizations:   . Attends Banker Meetings:   Marland Kitchen Marital Status:    Intimate Partner Violence:   . Fear of Current or Ex-Partner:   . Emotionally Abused:   Marland Kitchen Physically Abused:   . Sexually Abused:     Outpatient Medications Prior to Visit  Medication Sig Dispense Refill  . albuterol (VENTOLIN HFA) 108 (90 Base) MCG/ACT inhaler Inhale 2 puffs into the lungs every 6 (six) hours as needed for wheezing or shortness of breath. 8 g 11  . aspirin EC 81 MG tablet Take 1 tablet (81 mg total) by mouth daily. 90 tablet 3  . atorvastatin (LIPITOR) 40 MG tablet Take 1 tablet (40 mg total) by mouth daily at 6 PM. 90 tablet 3  . benzonatate (TESSALON) 100 MG capsule Take 1 capsule (100 mg total) by mouth 2 (two) times daily as needed for cough. (Patient not taking: Reported on 04/14/2020) 20 capsule 0  . sucralfate (CARAFATE) 1 g tablet Take 1 tablet (1 g total) by mouth 4 (four) times daily -  with meals and at bedtime for 7 days. 28 tablet 0   No facility-administered medications prior to visit.    No Known Allergies  ROS Review of Systems  Constitutional: Negative.   HENT: Negative.   Eyes: Negative.   Respiratory: Negative.   Cardiovascular: Negative.   Gastrointestinal: Negative.   Endocrine: Negative.   Genitourinary: Negative.   Musculoskeletal: Positive for arthralgias (right-sided extemity pain).  Skin: Negative.   Allergic/Immunologic: Negative.   Neurological: Positive for dizziness (occasional ), weakness (right-sided) and headaches (frequent ).  Hematological: Negative.   Psychiatric/Behavioral: Negative.     Objective:    Physical Exam  Constitutional: He is oriented to person, place, and time. He appears well-developed and well-nourished.  HENT:  Head: Normocephalic and atraumatic.  Eyes: Conjunctivae are normal.  Cardiovascular: Normal rate, regular rhythm, normal heart sounds and intact distal pulses.  Pulmonary/Chest: Effort normal and breath sounds normal.  Abdominal: Soft. Bowel sounds are normal.  Musculoskeletal:         General: Normal range of motion.     Cervical back: Normal range of motion and neck supple.  Neurological: He is alert and oriented to person, place, and time. He has normal reflexes.  Skin: Skin is warm and dry.  Psychiatric: He has a normal mood and affect. His behavior is normal. Judgment and thought content normal.  Nursing note and vitals reviewed.   BP 117/72   Pulse 80   Temp 98.3 F (36.8 C)   Ht 6' (1.829 m)   Wt 178 lb 3.2 oz (80.8 kg)   SpO2 100%   BMI 24.17 kg/m  Wt Readings from Last 3 Encounters:  04/14/20 178 lb 3.2 oz (80.8 kg)  02/10/20 172 lb 3.2 oz (78.1 kg)  12/04/19 162 lb (73.5 kg)  Health Maintenance Due  Topic Date Due  . HIV Screening  Never done  . COVID-19 Vaccine (1) Never done  . TETANUS/TDAP  Never done    There are no preventive care reminders to display for this patient.  No results found for: TSH Lab Results  Component Value Date   WBC 5.6 10/13/2019   HGB 15.2 10/13/2019   HCT 49.7 10/13/2019   MCV 74.8 (L) 10/13/2019   PLT 251 10/13/2019   Lab Results  Component Value Date   NA 141 10/13/2019   K 4.3 10/13/2019   CO2 29 10/13/2019   GLUCOSE 113 (H) 10/13/2019   BUN 9 10/13/2019   CREATININE 0.97 10/13/2019   BILITOT 0.5 05/21/2019   ALKPHOS 52 05/21/2019   AST 24 05/21/2019   ALT 44 05/21/2019   PROT 6.0 (L) 05/21/2019   ALBUMIN 3.3 (L) 05/21/2019   CALCIUM 9.1 10/13/2019   ANIONGAP 7 10/13/2019   Lab Results  Component Value Date   CHOL 108 11/18/2019   Lab Results  Component Value Date   HDL 45.10 11/18/2019   Lab Results  Component Value Date   LDLCALC 46 11/18/2019   Lab Results  Component Value Date   TRIG 83.0 11/18/2019   Lab Results  Component Value Date   CHOLHDL 2 11/18/2019   Lab Results  Component Value Date   HGBA1C 5.7 (H) 05/22/2019      Assessment & Plan:   1. History of CVA (cerebrovascular accident) No signs or symptoms of recurrence noted today.  2. Right sided weakness  - gabapentin (NEURONTIN) 300 MG capsule; Take 1 capsule (300 mg total) by mouth 2 (two) times daily.  Dispense: 60 capsule; Refill: 6  3. Generalized weakness - gabapentin (NEURONTIN) 300 MG capsule; Take 1 capsule (300 mg total) by mouth 2 (two) times daily.  Dispense: 60 capsule; Refill: 6  4. Nonintractable headache, unspecified chronicity pattern, unspecified headache type - topiramate (TOPAMAX) 50 MG tablet; Take 1 tablet (50 mg total) by mouth 2 (two) times daily.  Dispense: 60 tablet; Refill: 6  5. Pain of right lower extremity - gabapentin (NEURONTIN) 300 MG capsule; Take 1 capsule (300 mg total) by mouth 2 (two) times daily.  Dispense: 60 capsule; Refill: 6  6. Healthcare maintenance - POCT urinalysis dipstick  7. Follow up He will follow up in 6 months.   Meds ordered this encounter  Medications  . topiramate (TOPAMAX) 50 MG tablet    Sig: Take 1 tablet (50 mg total) by mouth 2 (two) times daily.    Dispense:  60 tablet    Refill:  6  . gabapentin (NEURONTIN) 300 MG capsule    Sig: Take 1 capsule (300 mg total) by mouth 2 (two) times daily.    Dispense:  60 capsule    Refill:  6   Orders Placed This Encounter  Procedures  . POCT urinalysis dipstick    Referral Orders  No referral(s) requested today    Kathe Becton,  MSN, FNP-BC [address] [place], [place] [address] [phone] fax    Problem List Items Addressed This Visit      Nervous and Auditory   Right sided weakness   Relevant Medications   gabapentin (NEURONTIN) 300 MG capsule     Other   Generalized weakness   Relevant Medications   gabapentin (NEURONTIN) 300 MG capsule    Other Visit Diagnoses  History of CVA (cerebrovascular accident)    -  Primary   Nonintractable headache, unspecified chronicity pattern, unspecified headache type       Relevant Medications   topiramate (TOPAMAX) 50  MG tablet   gabapentin (NEURONTIN) 300 MG capsule   Pain of right lower extremity       Relevant Medications   gabapentin (NEURONTIN) 300 MG capsule   Healthcare maintenance       Relevant Orders   POCT urinalysis dipstick (Completed)   Follow up          Meds ordered this encounter  Medications  . topiramate (TOPAMAX) 50 MG tablet    Sig: Take 1 tablet (50 mg total) by mouth 2 (two) times daily.    Dispense:  60 tablet    Refill:  6  . gabapentin (NEURONTIN) 300 MG capsule    Sig: Take 1 capsule (300 mg total) by mouth 2 (two) times daily.    Dispense:  60 capsule    Refill:  6    Follow-up: Return in about 6 months (around 10/15/2020).    Kallie Locks, FNP

## 2020-04-14 NOTE — Patient Instructions (Addendum)
Gabapentin capsules or tablets What is this medicine? GABAPENTIN (GA ba pen tin) is used to control seizures in certain types of epilepsy. It is also used to treat certain types of nerve pain. This medicine may be used for other purposes; ask your health care provider or pharmacist if you have questions. COMMON BRAND NAME(S): Active-PAC with Gabapentin, Gabarone, Neurontin What should I tell my health care provider before I take this medicine? They need to know if you have any of these conditions:  history of drug abuse or alcohol abuse problem  kidney disease  lung or breathing disease  suicidal thoughts, plans, or attempt; a previous suicide attempt by you or a family member  an unusual or allergic reaction to gabapentin, other medicines, foods, dyes, or preservatives  pregnant or trying to get pregnant  breast-feeding How should I use this medicine? Take this medicine by mouth with a glass of water. Follow the directions on the prescription label. You can take it with or without food. If it upsets your stomach, take it with food. Take your medicine at regular intervals. Do not take it more often than directed. Do not stop taking except on your doctor's advice. If you are directed to break the 600 or 800 mg tablets in half as part of your dose, the extra half tablet should be used for the next dose. If you have not used the extra half tablet within 28 days, it should be thrown away. A special MedGuide will be given to you by the pharmacist with each prescription and refill. Be sure to read this information carefully each time. Talk to your pediatrician regarding the use of this medicine in children. While this drug may be prescribed for children as young as 3 years for selected conditions, precautions do apply. Overdosage: If you think you have taken too much of this medicine contact a poison control center or emergency room at once. NOTE: This medicine is only for you. Do not share this  medicine with others. What if I miss a dose? If you miss a dose, take it as soon as you can. If it is almost time for your next dose, take only that dose. Do not take double or extra doses. What may interact with this medicine? This medicine may interact with the following medications:  alcohol  antihistamines for allergy, cough, and cold  certain medicines for anxiety or sleep  certain medicines for depression like amitriptyline, fluoxetine, sertraline  certain medicines for seizures like phenobarbital, primidone  certain medicines for stomach problems  general anesthetics like halothane, isoflurane, methoxyflurane, propofol  local anesthetics like lidocaine, pramoxine, tetracaine  medicines that relax muscles for surgery  narcotic medicines for pain  phenothiazines like chlorpromazine, mesoridazine, prochlorperazine, thioridazine This list may not describe all possible interactions. Give your health care provider a list of all the medicines, herbs, non-prescription drugs, or dietary supplements you use. Also tell them if you smoke, drink alcohol, or use illegal drugs. Some items may interact with your medicine. What should I watch for while using this medicine? Visit your doctor or health care provider for regular checks on your progress. You may want to keep a record at home of how you feel your condition is responding to treatment. You may want to share this information with your doctor or health care provider at each visit. You should contact your doctor or health care provider if your seizures get worse or if you have any new types of seizures. Do not stop taking   this medicine or any of your seizure medicines unless instructed by your doctor or health care provider. Stopping your medicine suddenly can increase your seizures or their severity. This medicine may cause serious skin reactions. They can happen weeks to months after starting the medicine. Contact your health care  provider right away if you notice fevers or flu-like symptoms with a rash. The rash may be red or purple and then turn into blisters or peeling of the skin. Or, you might notice a red rash with swelling of the face, lips or lymph nodes in your neck or under your arms. Wear a medical identification bracelet or chain if you are taking this medicine for seizures, and carry a card that lists all your medications. You may get drowsy, dizzy, or have blurred vision. Do not drive, use machinery, or do anything that needs mental alertness until you know how this medicine affects you. To reduce dizzy or fainting spells, do not sit or stand up quickly, especially if you are an older patient. Alcohol can increase drowsiness and dizziness. Avoid alcoholic drinks. Your mouth may get dry. Chewing sugarless gum or sucking hard candy, and drinking plenty of water will help. The use of this medicine may increase the chance of suicidal thoughts or actions. Pay special attention to how you are responding while on this medicine. Any worsening of mood, or thoughts of suicide or dying should be reported to your health care provider right away. Women who become pregnant while using this medicine may enroll in the [place] Pregnancy Registry by calling [phone]. This registry collects information about the safety of antiepileptic drug use during pregnancy. What side effects may I notice from receiving this medicine? Side effects that you should report to your doctor or health care professional as soon as possible:  allergic reactions like skin rash, itching or hives, swelling of the face, lips, or tongue  breathing problems  rash, fever, and swollen lymph nodes  redness, blistering, peeling or loosening of the skin, including inside the mouth  suicidal thoughts, mood changes Side effects that usually do not require medical attention (report to your doctor or health care professional if they  continue or are bothersome):  dizziness  drowsiness  headache  nausea, vomiting  swelling of ankles, feet, hands  tiredness This list may not describe all possible side effects. Call your doctor for medical advice about side effects. You may report side effects to FDA at 1-800-FDA-1088. Where should I keep my medicine? Keep out of reach of children. This medicine may cause accidental overdose and death if it taken by other adults, children, or pets. Mix any unused medicine with a substance like cat litter or coffee grounds. Then throw the medicine away in a sealed container like a sealed bag or a coffee can with a lid. Do not use the medicine after the expiration date. Store at room temperature between 15 and 30 degrees C (59 and 86 degrees F). NOTE: This sheet is a summary. It may not cover all possible information. If you have questions about this medicine, talk to your doctor, pharmacist, or health care provider.  2020 Elsevier/Gold Standard (2019-02-15 14:16:43) Peripheral Neuropathy Peripheral neuropathy is a type of nerve damage. It affects nerves that carry signals between the spinal cord and the arms, legs, and the rest of the body (peripheral nerves). It does not affect nerves in the spinal cord or brain. In peripheral neuropathy, one nerve or a group of nerves may be damaged.  Peripheral neuropathy is a broad category that includes many specific nerve disorders, like diabetic neuropathy, hereditary neuropathy, and carpal tunnel syndrome. What are the causes? This condition may be caused by:  Diabetes. This is the most common cause of peripheral neuropathy.  Nerve injury.  Pressure or stress on a nerve that lasts a long time.  Lack (deficiency) of B vitamins. This can result from alcoholism, poor diet, or a restricted diet.  Infections.  Autoimmune diseases, such as rheumatoid arthritis and systemic lupus erythematosus.  Nerve diseases that are passed from parent to  child (inherited).  Some medicines, such as cancer medicines (chemotherapy).  Poisonous (toxic) substances, such as lead and mercury.  Too little blood flowing to the legs.  Kidney disease.  Thyroid disease. In some cases, the cause of this condition is not known. What are the signs or symptoms? Symptoms of this condition depend on which of your nerves is damaged. Common symptoms include:  Loss of feeling (numbness) in the feet, hands, or both.  Tingling in the feet, hands, or both.  Burning pain.  Very sensitive skin.  Weakness.  Not being able to move a part of the body (paralysis).  Muscle twitching.  Clumsiness or poor coordination.  Loss of balance.  Not being able to control your bladder.  Feeling dizzy.  Sexual problems. How is this diagnosed? Diagnosing and finding the cause of peripheral neuropathy can be difficult. Your health care provider will take your medical history and do a physical exam. A neurological exam will also be done. This involves checking things that are affected by your brain, spinal cord, and nerves (nervous system). For example, your health care provider will check your reflexes, how you move, and what you can feel. You may have other tests, such as:  Blood tests.  Electromyogram (EMG) and nerve conduction tests. These tests check nerve function and how well the nerves are controlling the muscles.  Imaging tests, such as CT scans or MRI to rule out other causes of your symptoms.  Removing a small piece of nerve to be examined in a lab (nerve biopsy). This is rare.  Removing and examining a small amount of the fluid that surrounds the brain and spinal cord (lumbar puncture). This is rare. How is this treated? Treatment for this condition may involve:  Treating the underlying cause of the neuropathy, such as diabetes, kidney disease, or vitamin deficiencies.  Stopping medicines that can cause neuropathy, such as  chemotherapy.  Medicine to relieve pain. Medicines may include: ? Prescription or over-the-counter pain medicine. ? Antiseizure medicine. ? Antidepressants. ? Pain-relieving patches that are applied to painful areas of skin.  Surgery to relieve pressure on a nerve or to destroy a nerve that is causing pain.  Physical therapy to help improve movement and balance.  Devices to help you move around (assistive devices). Follow these instructions at home: Medicines  Take over-the-counter and prescription medicines only as told by your health care provider. Do not take any other medicines without first asking your health care provider.  Do not drive or use heavy machinery while taking prescription pain medicine. Lifestyle   Do not use any products that contain nicotine or tobacco, such as cigarettes and e-cigarettes. Smoking keeps blood from reaching damaged nerves. If you need help quitting, ask your health care provider.  Avoid or limit alcohol. Too much alcohol can cause a vitamin B deficiency, and vitamin B is needed for healthy nerves.  Eat a healthy diet. This includes: ? Eating  foods that are high in fiber, such as fresh fruits and vegetables, whole grains, and beans. ? Limiting foods that are high in fat and processed sugars, such as fried or sweet foods. General instructions   If you have diabetes, work closely with your health care provider to keep your blood sugar under control.  If you have numbness in your feet: ? Check every day for signs of injury or infection. Watch for redness, warmth, and swelling. ? Wear padded socks and comfortable shoes. These help protect your feet.  Develop a good support system. Living with peripheral neuropathy can be stressful. Consider talking with a mental health specialist or joining a support group.  Use assistive devices and attend physical therapy as told by your health care provider. This may include using a walker or a cane.  Keep  all follow-up visits as told by your health care provider. This is important. Contact a health care provider if:  You have new signs or symptoms of peripheral neuropathy.  You are struggling emotionally from dealing with peripheral neuropathy.  Your pain is not well-controlled. Get help right away if:  You have an injury or infection that is not healing normally.  You develop new weakness in an arm or leg.  You fall frequently. Summary  Peripheral neuropathy is when the nerves in the arms, or legs are damaged, resulting in numbness, weakness, or pain.  There are many causes of peripheral neuropathy, including diabetes, pinched nerves, vitamin deficiencies, autoimmune disease, and hereditary conditions.  Diagnosing and finding the cause of peripheral neuropathy can be difficult. Your health care provider will take your medical history, do a physical exam, and do tests, including blood tests and nerve function tests.  Treatment involves treating the underlying cause of the neuropathy and taking medicines to help control pain. Physical therapy and assistive devices may also help. This information is not intended to replace advice given to you by your health care provider. Make sure you discuss any questions you have with your health care provider. Document Revised: 10/27/2017 Document Reviewed: 01/23/2017 Elsevier Patient Education  2020 [employer]. Migraine Headache A migraine headache is a very strong throbbing pain on one side or both sides of your head. This type of headache can also cause other symptoms. It can last from 4 hours to 3 days. Talk with your doctor about what things may bring on (trigger) this condition. What are the causes? The exact cause of this condition is not known. This condition may be triggered or caused by:  Drinking alcohol.  Smoking.  Taking medicines, such as: ? Medicine used to treat chest pain (nitroglycerin). ? Birth control  pills. ? Estrogen. ? Some blood pressure medicines.  Eating or drinking certain products.  Doing physical activity. Other things that may trigger a migraine headache include:  Having a menstrual period.  Pregnancy.  Hunger.  Stress.  Not getting enough sleep or getting too much sleep.  Weather changes.  Tiredness (fatigue). What increases the risk?  Being 26-61 years old.  Being male.  Having a family history of migraine headaches.  Being Caucasian.  Having depression or anxiety.  Being very overweight. What are the signs or symptoms?  A throbbing pain. This pain may: ? Happen in any area of the head, such as on one side or both sides. ? Make it hard to do daily activities. ? Get worse with physical activity. ? Get worse around bright lights or loud noises.  Other symptoms may include: ? Feeling  sick to your stomach (nauseous). ? Vomiting. ? Dizziness. ? Being sensitive to bright lights, loud noises, or smells.  Before you get a migraine headache, you may get warning signs (an aura). An aura may include: ? Seeing flashing lights or having blind spots. ? Seeing bright spots, halos, or zigzag lines. ? Having tunnel vision or blurred vision. ? Having numbness or a tingling feeling. ? Having trouble talking. ? Having weak muscles.  Some people have symptoms after a migraine headache (postdromal phase), such as: ? Tiredness. ? Trouble thinking (concentrating). How is this treated?  Taking medicines that: ? Relieve pain. ? Relieve the feeling of being sick to your stomach. ? Prevent migraine headaches.  Treatment may also include: ? Having acupuncture. ? Avoiding foods that bring on migraine headaches. ? Learning ways to control your body functions (biofeedback). ? Therapy to help you know and deal with negative thoughts (cognitive behavioral therapy). Follow these instructions at home: Medicines  Take over-the-counter and prescription medicines  only as told by your doctor.  Ask your doctor if the medicine prescribed to you: ? Requires you to avoid driving or using heavy machinery. ? Can cause trouble pooping (constipation). You may need to take these steps to prevent or treat trouble pooping:  Drink enough fluid to keep your pee (urine) pale yellow.  Take over-the-counter or prescription medicines.  Eat foods that are high in fiber. These include beans, whole grains, and fresh fruits and vegetables.  Limit foods that are high in fat and sugar. These include fried or sweet foods. Lifestyle  Do not drink alcohol.  Do not use any products that contain nicotine or tobacco, such as cigarettes, e-cigarettes, and chewing tobacco. If you need help quitting, ask your doctor.  Get at least 8 hours of sleep every night.  Limit and deal with stress. General instructions      Keep a journal to find out what may bring on your migraine headaches. For example, write down: ? What you eat and drink. ? How much sleep you get. ? Any change in what you eat or drink. ? Any change in your medicines.  If you have a migraine headache: ? Avoid things that make your symptoms worse, such as bright lights. ? It may help to lie down in a dark, quiet room. ? Do not drive or use heavy machinery. ? Ask your doctor what activities are safe for you.  Keep all follow-up visits as told by your doctor. This is important. Contact a doctor if:  You get a migraine headache that is different or worse than others you have had.  You have more than 15 headache days in one month. Get help right away if:  Your migraine headache gets very bad.  Your migraine headache lasts longer than 72 hours.  You have a fever.  You have a stiff neck.  You have trouble seeing.  Your muscles feel weak or like you cannot control them.  You start to lose your balance a lot.  You start to have trouble walking.  You pass out (faint).  You have a  seizure. Summary  A migraine headache is a very strong throbbing pain on one side or both sides of your head. These headaches can also cause other symptoms.  This condition may be treated with medicines and changes to your lifestyle.  Keep a journal to find out what may bring on your migraine headaches.  Contact a doctor if you get a migraine headache that is  different or worse than others you have had.  Contact your doctor if you have more than 15 headache days in a month. This information is not intended to replace advice given to you by your health care provider. Make sure you discuss any questions you have with your health care provider. Document Revised: 03/08/2019 Document Reviewed: 12/27/2018 Elsevier Patient Education  2020 [employer] Topiramate tablets What is this medicine? TOPIRAMATE (toe PYRE a mate) is used to treat seizures in adults or children with epilepsy. It is also used for the prevention of migraine headaches. This medicine may be used for other purposes; ask your health care provider or pharmacist if you have questions. COMMON BRAND NAME(S): Topamax, Topiragen What should I tell my health care provider before I take this medicine? They need to know if you have any of these conditions:  bleeding disorders  kidney disease  lung or breathing disease, like asthma  suicidal thoughts, plans, or attempt; a previous suicide attempt by you or a family member  an unusual or allergic reaction to topiramate, other medicines, foods, dyes, or preservatives  pregnant or trying to get pregnant  breast-feeding How should I use this medicine? Take this medicine by mouth with a glass of water. Follow the directions on the prescription label. Do not cut, crush or chew this medicine. Swallow the tablets whole. You can take it with or without food. If it upsets your stomach, take it with food. Take your medicine at regular intervals. Do not take it more often than directed.  Do not stop taking except on your doctor's advice. A special MedGuide will be given to you by the pharmacist with each prescription and refill. Be sure to read this information carefully each time. Talk to your pediatrician regarding the use of this medicine in children. While this drug may be prescribed for children as young as 152 years of age for selected conditions, precautions do apply. Overdosage: If you think you have taken too much of this medicine contact a poison control center or emergency room at once. NOTE: This medicine is only for you. Do not share this medicine with others. What if I miss a dose? If you miss a dose, take it as soon as you can. If your next dose is to be taken in less than 6 hours, then do not take the missed dose. Take the next dose at your regular time. Do not take double or extra doses. What may interact with this medicine? This medicine may interact with the following medications:  acetazolamide  alcohol  antihistamines for allergy, cough, and cold  aspirin and aspirin-like medicines  atropine  birth control pills  certain medicines for anxiety or sleep  certain medicines for bladder problems like oxybutynin, tolterodine  certain medicines for depression like amitriptyline, fluoxetine, sertraline  certain medicines for seizures like carbamazepine, phenobarbital, phenytoin, primidone, valproic acid, zonisamide  certain medicines for stomach problems like dicyclomine, hyoscyamine  certain medicines for travel sickness like scopolamine  certain medicines for Parkinson's disease like benztropine, trihexyphenidyl  certain medicines that treat or prevent blood clots like warfarin, enoxaparin, dalteparin, apixaban, dabigatran, and rivaroxaban  digoxin  general anesthetics like halothane, isoflurane, methoxyflurane, propofol  hydrochlorothiazide  ipratropium  lithium  medicines that relax muscles for surgery  metformin  narcotic medicines  for pain  NSAIDs, medicines for pain and inflammation, like ibuprofen or naproxen  phenothiazines like chlorpromazine, mesoridazine, prochlorperazine, thioridazine  pioglitazone This list may not describe all possible interactions. Give your health care  provider a list of all the medicines, herbs, non-prescription drugs, or dietary supplements you use. Also tell them if you smoke, drink alcohol, or use illegal drugs. Some items may interact with your medicine. What should I watch for while using this medicine? Visit your doctor or health care professional for regular checks on your progress. Tell your health care professional if your symptoms do not start to get better or if they get worse. Do not stop taking except on your health care professional's advice. You may develop a severe reaction. Your health care professional will tell you how much medicine to take. Wear a medical ID bracelet or chain. Carry a card that describes your disease and details of your medicine and dosage times. This medicine can reduce the response of your body to heat or cold. Dress warm in cold weather and stay hydrated in hot weather. If possible, avoid extreme temperatures like saunas, hot tubs, very hot or cold showers, or activities that can cause dehydration such as vigorous exercise. Check with your health care professional if you have severe diarrhea, nausea, and vomiting, or if you sweat a lot. The loss of too much body fluid may make it dangerous for you to take this medicine. You may get drowsy or dizzy. Do not drive, use machinery, or do anything that needs mental alertness until you know how this medicine affects you. Do not stand up or sit up quickly, especially if you are an older patient. This reduces the risk of dizzy or fainting spells. Alcohol may interfere with the effect of this medicine. Avoid alcoholic drinks. Tell your health care professional right away if you have any change in your  eyesight. Patients and their families should watch out for new or worsening depression or thoughts of suicide. Also watch out for sudden changes in feelings such as feeling anxious, agitated, panicky, irritable, hostile, aggressive, impulsive, severely restless, overly excited and hyperactive, or not being able to sleep. If this happens, especially at the beginning of treatment or after a change in dose, call your healthcare professional. This medicine may cause serious skin reactions. They can happen weeks to months after starting the medicine. Contact your health care provider right away if you notice fevers or flu-like symptoms with a rash. The rash may be red or purple and then turn into blisters or peeling of the skin. Or, you might notice a red rash with swelling of the face, lips or lymph nodes in your neck or under your arms. Birth control may not work properly while you are taking this medicine. Talk to your health care professional about using an extra method of birth control. Women should inform their health care professional if they wish to become pregnant or think they might be pregnant. There is a potential for serious side effects and harm to an unborn child. Talk to your health care professional for more information. What side effects may I notice from receiving this medicine? Side effects that you should report to your doctor or health care professional as soon as possible:  allergic reactions like skin rash, itching or hives, swelling of the face, lips, or tongue  blood in the urine  changes in vision  confusion  loss of memory  pain in lower back or side  pain when urinating  redness, blistering, peeling or loosening of the skin, including inside the mouth  signs and symptoms of bleeding such as bloody or black, tarry stools; red or dark brown urine; spitting up blood or  brown material that looks like coffee grounds; red spots on the skin; unusual bruising or bleeding from  the eyes, gums, or nose  signs and symptoms of increased acid in the body like breathing fast; fast heartbeat; headache; confusion; unusually weak or tired; nausea, vomiting  suicidal thoughts, mood changes  trouble speaking or understanding  unusual sweating  unusually weak or tired Side effects that usually do not require medical attention (report to your doctor or health care professional if they continue or are bothersome):  dizziness  drowsiness  fever  loss of appetite  nausea, vomiting  pain, tingling, numbness in the hands or feet  stomach pain  tiredness  upset stomach This list may not describe all possible side effects. Call your doctor for medical advice about side effects. You may report side effects to FDA at 1-800-FDA-1088. Where should I keep my medicine? Keep out of the reach of children. Store at room temperature between 15 and 30 degrees C (59 and 86 degrees F). Throw away any unused medicine after the expiration date. NOTE: This sheet is a summary. It may not cover all possible information. If you have questions about this medicine, talk to your doctor, pharmacist, or health care provider.  2020 [employer] Standard (2019-06-13 15:07:20)

## 2020-04-15 DIAGNOSIS — R519 Headache, unspecified: Secondary | ICD-10-CM

## 2020-04-15 DIAGNOSIS — M79604 Pain in right leg: Secondary | ICD-10-CM | POA: Insufficient documentation

## 2020-04-15 HISTORY — DX: Headache, unspecified: R51.9

## 2020-05-08 ENCOUNTER — Ambulatory Visit (INDEPENDENT_AMBULATORY_CARE_PROVIDER_SITE_OTHER): Payer: Medicaid Other | Admitting: *Deleted

## 2020-05-08 DIAGNOSIS — I639 Cerebral infarction, unspecified: Secondary | ICD-10-CM

## 2020-05-11 LAB — CUP PACEART REMOTE DEVICE CHECK
Date Time Interrogation Session: 20210610230415
Implantable Pulse Generator Implant Date: 20200828

## 2020-05-11 NOTE — Progress Notes (Signed)
Carelink Summary Report / Loop Recorder 

## 2020-05-12 ENCOUNTER — Encounter: Payer: Self-pay | Admitting: Psychology

## 2020-05-12 ENCOUNTER — Ambulatory Visit: Payer: Medicaid Other | Admitting: Psychology

## 2020-05-12 ENCOUNTER — Other Ambulatory Visit: Payer: Self-pay

## 2020-05-12 ENCOUNTER — Ambulatory Visit (INDEPENDENT_AMBULATORY_CARE_PROVIDER_SITE_OTHER): Payer: Medicaid Other | Admitting: Psychology

## 2020-05-12 DIAGNOSIS — I639 Cerebral infarction, unspecified: Secondary | ICD-10-CM | POA: Diagnosis not present

## 2020-05-12 DIAGNOSIS — I999 Unspecified disorder of circulatory system: Secondary | ICD-10-CM | POA: Insufficient documentation

## 2020-05-12 DIAGNOSIS — F0150 Vascular dementia without behavioral disturbance: Secondary | ICD-10-CM

## 2020-05-12 DIAGNOSIS — F01A0 Vascular dementia, mild, without behavioral disturbance, psychotic disturbance, mood disturbance, and anxiety: Secondary | ICD-10-CM

## 2020-05-12 DIAGNOSIS — R4189 Other symptoms and signs involving cognitive functions and awareness: Secondary | ICD-10-CM

## 2020-05-12 HISTORY — DX: Unspecified disorder of circulatory system: I99.9

## 2020-05-12 HISTORY — DX: Vascular dementia, mild, without behavioral disturbance, psychotic disturbance, mood disturbance, and anxiety: F01.A0

## 2020-05-12 HISTORY — DX: Vascular dementia without behavioral disturbance: F01.50

## 2020-05-12 NOTE — Progress Notes (Signed)
   Psychometrician Note   Cognitive testing was administered to Brandon Summers by Wallace Keller, B.S. (psychometrist) under the supervision of Dr. Newman Nickels, Ph.D., licensed psychologist on 05/12/20. Brandon Summers did not appear overtly distressed by the testing session per behavioral observation or responses across self-report questionnaires. Dr. Newman Nickels, Ph.D. checked in with Brandon Summers as needed to manage any distress related to testing procedures (if applicable). Rest breaks were offered.    The battery of tests administered was selected by Dr. Newman Nickels, Ph.D. with consideration to Brandon Summers's current level of functioning, the nature of his symptoms, emotional and behavioral responses during interview, level of literacy, observed level of motivation/effort, and the nature of the referral question. This battery was communicated to the psychometrist. Communication between Dr. Newman Nickels, Ph.D. and the psychometrist was ongoing throughout the evaluation and Dr. Newman Nickels, Ph.D. was immediately accessible at all times. Dr. Newman Nickels, Ph.D. provided supervision to the psychometrist on the date of this service to the extent necessary to assure the quality of all services provided.    Brandon Summers will return within approximately 1-2 weeks for an interactive feedback session with Dr. [name] at which time his test performances, clinical impressions, and treatment recommendations will be reviewed in detail. Brandon Summers understands he can contact our office should he require our assistance before this time.  A total of 135 minutes of billable time were spent face-to-face with Brandon Summers by the psychometrist. This includes both test administration and scoring time. Billing for these services is reflected in the clinical report generated by Dr. Newman Nickels, Ph.D..  This note reflects time spent with the psychometrician and does not include test scores or any clinical  interpretations made by Dr. [name]. The full report will follow in a separate note.

## 2020-05-12 NOTE — Progress Notes (Signed)
NEUROPSYCHOLOGICAL EVALUATION [place]. [facility] [place] Department of Neurology  Date of Evaluation: May 12, 2020  Reason for Referral:   Ronson Hagins is a 49 y.o. 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   Assessment and Plan:   Clinical Impression(s): Overall, Mr. Mazo pattern of performance is suggestive of diffuse cognitive impairment generally in the exceptionally low normative ranges. As further described below, comprehension difficulties may have influenced his understanding of task instructions. However, poor performance persisted even when more exhaustive instructions were provided in a somewhat non-standardized manner. No relative strengths were able to be identified across testing.   There are several caveats which are important to note surrounding test interpretation. First, it is important to note that testing was completed in [place] rather than Mr. Regnier native language [place]). Despite Mr. Moroney being able to converse in English well, there remains the potential for test scores to be attenuated due to difficulties comprehending and conversing in a non-native language. Second, scores across stand-alone and embedded performance validity measures were consistently below expectation. However, this is believed to represent a combination of true cognitive dysfunction, as well as him being tested in his non-native language. Overall, there is a good likelihood that current performances underestimate true cognitive abilities. While the extent to this is unclear, I do believe that Mr. Kucinski is experiencing significant levels of cognitive dysfunction stemming from his stroke. Importantly, Mr. Kagawa did endorse acute symptoms of severe depression and  moderate anxiety. While these conditions can certainly influence cognitive dysfunction, current deficits would be above and beyond what would be expected from psychiatric distress alone. 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 He did acknowledge requiring assistance from his daughter in performing instrumental ADLs, which would indicate the potential for a milder presentation of a major vascular neurocognitive disorder (i.e., vascular dementia). However, the former diagnosis appears more prudent at the present time given the aforementioned factors, as well as the unknown contribution of mood and sleep dysfunction to his current clinical presentation. Continued medical monitoring will be important moving forward.  Recommendations: Should a clinician in his immediate area who has access to French-speaking cognitive assessments and normative data be identified, a repeat evaluation in this format may be beneficial so that the caveat of him being tested in a non-native language can be mitigated. This would likely offer improved diagnostic clarity.   I agree with Dr. Everlena Cooper that it would be difficult for Mr. Jamar to find and maintain competitive employment based upon ongoing cognitive deficits. He could consider applying for disability benefits if he has not already done so.   Mr. Pulido is encouraged to continue working with outpatient speech and occupational therapists in efforts to promote cognitive recovery stemming from his stroke.  Topamax/topiramate has well-known cognitive side effects which could be influencing his current presentation to a mild extent. He could discuss this medication with his prescribing physician and see if there are alternative options for headache management.   A combination of medication and psychotherapy has been shown to be most effective at treating symptoms of anxiety and  depression. As such, Mr. Applegate is encouraged to speak with his prescribing physician regarding medication adjustments to optimally manage these symptoms. His level of cognitive impairment may make it  challenging to engage in individual psychotherapy. However, if a French-speaking individual was found in the area, he could attempt short-term treatment. He would benefit from an active and collaborative therapeutic environment, rather than one purely supportive in nature. Recommended treatment modalities include Cognitive Behavioral Therapy (CBT) or Acceptance and Commitment Therapy (ACT).  Mr. Wende CreaseSeyni is encouraged to attend to lifestyle factors for brain health (e.g., regular physical exercise, good nutrition habits, regular participation in cognitively-stimulating activities, and general stress management techniques), which are likely to have benefits for both emotional adjustment and cognition. In fact, in addition to promoting good general health, regular exercise incorporating aerobic activities (e.g., brisk walking, jogging, cycling, etc.) has been demonstrated to be a very effective treatment for depression and stress, with similar efficacy rates to both antidepressant medication and psychotherapy. Optimal control of vascular risk factors (including safe cardiovascular exercise and adherence to dietary recommendations) is encouraged.  Performance across neurocognitive testing is not a strong predictor of an individual's safety operating a motor vehicle. However, given the diffuse and severe nature of cognitive impairment across testing, I would recommend that he not operate a motorized vehicle. Should his family wish to pursue a formalized driving evaluation, they would be encouraged to contact [employer] in [place], [place] at [phone]. Another option would be through [facility]; however, the latter would likely require a referral from a medical doctor. [facility] can be reached  directly at [phone].   It will be important for Mr. Worth to have another person with him when in situations where he may need to process information, weigh the pros and cons of different options, and make decisions, in order to ensure that he fully understands and recalls all information to be considered.  When learning new information, he would benefit from information being broken up into small, manageable pieces. He may also find it helpful to articulate the material in his own words and in a context to promote encoding at the onset of a new task. This material may need to be repeated multiple times to promote encoding.  Memory can be improved using internal strategies such as rehearsal, repetition, chunking, mnemonics, association, and imagery. External strategies such as written notes in a consistently used memory journal, visual and nonverbal auditory cues such as a calendar on the refrigerator or appointments with alarm, such as on a cell phone, can also help maximize recall.    To address problems with processing speed, he may wish to consider:   -Ensuring that he is alerted when essential material or instructions are being presented   -Adjusting the speed at which new information is presented   -Allowing for more time in comprehending, processing, and responding in conversation  To address problems with fluctuating attention, he may wish to consider:   -Avoiding external distractions when needing to concentrate   -Limiting exposure to fast paced environments with multiple sensory demands   -Writing down complicated information and using checklists   -Attempting and completing one task at a time (i.e., no multi-tasking)   -Verbalizing aloud each step of a task to maintain focus   -Reducing the amount of information considered at one time  Review of Records:   Mr. Wende CreaseSeyni was seen by [facility] Shon Millet(Adam Jaffe, D.O.) on 12/06/2019 for follow-up. Briefly, [facility] after presenting with blurredvision, headache, and right sided numbness. HeadCT revealed no acute stroke. However, brainMRI revealed an acute large left thalamic infarct, as well  as significant chronic small vessel ischemic changes. Head/neckCTA revealed no aneurysm or emergent large vessel stenosis or occlusion. 2D echo demonstrated EF 55-60% with no cardiac source of embolus. TEE demonstrated no PFO or other cardiac source of embolus. LDL was 114 and Hgb A1c was 5.7. He was discharged onASA 81mg and Plavix 75mg  daily for 3 weeks, followed by ASA alone. Following discharge, he reported some blurred vision. He noted that his right hand will hurt and that all extremities feel "tired." He also reported that he was drooling from both sides of his face.He stated that when he talks, he tends to talk slowly because he can develop a left sided headache. Most recently, he has been working with outpatient speech and occupational therapy. He reported that physical and cognitive weaknesses stemming from his stroke have made it so that he can no longer work. Dr. Tomi Likens agreed with this assessment. Ultimately, Mr. Minnie was referred for a comprehensive neuropsychological evaluation to characterize his cognitive abilities and to assist with diagnostic clarity and treatment planning.   Brain MRI on 05/21/19 revealed a 1.3 x 1.5 x 2.2 cm acute ischemic nonhemorrhagic infarct involving the ventral medial left thalamus without mass effect. Underlying microvascular ischemic disease was also reported.   Past Medical History:  Diagnosis Date  . Cough   . CVA (cerebral vascular accident) 05/21/2019   Left thalamic stroke  . HLD (hyperlipidemia) 05/23/2019  . Nonintractable headache 04/15/2020  . Pacemaker   . Right sided weakness 09/09/2019  . Shortness of breath   . Visual problems     Past Surgical History:  Procedure Laterality Date  . BUBBLE STUDY  05/23/2019   Procedure: BUBBLE STUDY;   Surgeon: Pixie Casino, MD;  Location: [facility] ENDOSCOPY;  Service: Cardiovascular;;  . implantable loop recorder placement  07/26/2019   MDT Reveal IRWE3 implanted by Dr Rayann Heman for cryptogenic stroke  . TEE WITHOUT CARDIOVERSION N/A 05/23/2019   Procedure: TRANSESOPHAGEAL ECHOCARDIOGRAM (TEE);  Surgeon: Pixie Casino, MD;  Location: [facility] ENDOSCOPY;  Service: Cardiovascular;  Laterality: N/A;    Current Outpatient Medications:  .  albuterol (VENTOLIN HFA) 108 (90 Base) MCG/ACT inhaler, Inhale 2 puffs into the lungs every 6 (six) hours as needed for wheezing or shortness of breath., Disp: 8 g, Rfl: 11 .  aspirin EC 81 MG tablet, Take 1 tablet (81 mg total) by mouth daily., Disp: 90 tablet, Rfl: 3 .  atorvastatin (LIPITOR) 40 MG tablet, Take 1 tablet (40 mg total) by mouth daily at 6 PM., Disp: 90 tablet, Rfl: 3 .  gabapentin (NEURONTIN) 300 MG capsule, Take 1 capsule (300 mg total) by mouth 2 (two) times daily., Disp: 60 capsule, Rfl: 6 .  topiramate (TOPAMAX) 50 MG tablet, Take 1 tablet (50 mg total) by mouth 2 (two) times daily., Disp: 60 tablet, Rfl: 6  Clinical Interview:   Cognitive Symptoms: Decreased short-term memory: Endorsed. He reported "forgetting a lot," especially when trying to remembering details of previous conversations. He noted that information will often come to him with time, but not always when needed. Deficits were said to be largely stable since his stroke.  Decreased long-term memory: Denied. Decreased attention/concentration: Denied. However, he did report instances of increased distractibility. This was said to especially be true while attempting to cook and getting distracted by others. He reported that this has led to him being unable to work due to his colleagues feeling that he is more prone to leaving the stove on or accidentally performing other unsafe behaviors.  Reduced processing speed: Endorsed. Difficulties with executive functions: Unknown. He was unsure how  to answer these questions. He did deny trouble with impulsivity and did not report significant personality changes.  Difficulties with emotion regulation: Denied. Difficulties with receptive language: Unknown. He was unsure how to answer these questions, but did allude to instances where he has trouble comprehending what is said to him. It is unclear how much of this could be due to a language barrier (i.e., English is not his primary language).  Difficulties with word finding: Endorsed. What he wishes to speak will sometimes come to him with time.  Decreased visuoperceptual ability: Denied.  Trajectory of deficits: Cognitive deficits were said to stem from his left thalamic stroke in June 2020. No deficits prior to this event were reported.   Difficulties completing ADLs: Endorsed. He acknowledged difficulties with medication and financial management, noting that his daughter assists him in this regard. He appeared to deny difficulties driving or operating a motorized vehicle. However, he also stated that he "doesn't really go anywhere" since his stroke occurred so his ability to drive safely since this event is somewhat unclear.   Additional Medical History: History of traumatic brain injury/concussion: Denied. History of stroke: Endorsed (see above).  History of seizure activity: Denied. History of known exposure to toxins: Denied. Symptoms of chronic pain: Denied. Experience of frequent headaches/migraines: Endorsed. Since his stroke, he reported recurring headache symptoms which can be debilitating at times. When asked about the location of headache symptoms, he pointed to his right frontotemporal region. Medical records also suggest prior left-sided headaches. He reported being prescribed a headache medication (likely Topamax/topiramate). He reported that he does not always take this medication due to it causing him to feel drowsy and fall asleep.  Frequent instances of dizziness/vertigo:  Denied.  Sensory changes: He reported ongoing visual acuity difficulties since his stroke, requiring the new usage of glasses. He also reported ongoing blurred vision and seeing "white" in his peripheral visual field. Other sensory changes/difficulties (i.e., hearing, taste, or smell) were denied.  Balance/coordination difficulties: Endorsed. He reported ongoing right-sided weakness since his stroke which has led to some balance instability. However, he denied a history of recent falls.  Other motor difficulties: Denied.  Sleep History: Estimated hours obtained each night: 8 hours.  Difficulties falling asleep: Denied. Difficulties staying asleep: Endorsed. These were largely described as instances where he will wake up throughout the night without a known cause. This was said to occur frequently.  Feels rested and refreshed upon awakening: Variably, depending on the amount of time slept the night before.   History of snoring: Denied. History of waking up gasping for air: Denied. Witnessed breath cessation while asleep: Denied.  History of vivid dreaming: Denied. Excessive movement while asleep: Denied. Instances of acting out his dreams: Denied.  Psychiatric/Behavioral Health History: Depression: Unclear. He described his current mood as "not like before" and noted that his daughter has commented that he smiles much less than before. He reported potentially being diagnosed with depression in the time since his stroke by an unknown male physician. He also alluded to being prescribed a "depression pill" by this individual. He denied previous involvement in psychotherapy. Current or remote suicidal ideation, intent, or plan was denied.  Anxiety: Denied. Mania: Denied. Trauma History: Denied. Visual/auditory hallucinations: Denied. Delusional thoughts: Denied.  Tobacco: Denied. He reported recently quitting in the time between his stroke and the current appointment.  Alcohol: He denied  current alcohol consumption, as well as a history  of alcohol abuse or dependence.  Recreational drugs: Denied. Caffeine: Denied.  Family History: Problem Relation Age of Onset  . Hypertension Mother   . Stroke Mother    This information was confirmed by Mr. Delano.  Academic/Vocational History: Highest level of educational attainment: 12 years. Mr. Neyhart grew up in [place] and completed academic pursuits in his home country. His primary language is [place]; however, he is able to converse in [place]. He described himself as a Designer, multimedia in academic settings and denied any relative weaknesses across school subjects.  History of developmental delay: Denied. History of grade repetition: Denied. Enrollment in special education courses: Denied. History of LD/ADHD: Denied.  Employment: Prior to his stroke, he worked as a Investment banker, operational. However, he reported being unable to work in this capacity due to the cognitive and physical effects of his prior stroke.   Evaluation Results:   Behavioral Observations: Mr. Farrington was unaccompanied, arrived to his appointment on time, and was appropriately dressed and groomed. He appeared alert and oriented. Observed gait and station were within normal limits. Gross motor functioning appeared intact upon informal observation and no abnormal movements (e.g., tremors) were noted. His affect was generally relaxed and positive, but did range appropriately given the subject being discussed during the clinical interview or the task at hand during testing procedures. Spontaneous speech was somewhat fluent. However, there were times where he appeared to mix [place] and [place]. He also had a tendency to complete sentences very rapidly, creating some instances where comprehension was difficult. Mild word finding difficulties were also observed during the clinical interview. Thought processes were coherent, organized, and normal in content during interview. Insight into his cognitive  difficulties appeared somewhat limited in that objective deficits were far more pronounced than subjective complaints. However, this may be due to language barriers and difficulties expressing himself as desired. During testing, sustained attention was appropriate. Task engagement was adequate and he persisted when challenged. There appeared to be some cultural and/or comprehension confounds. For example, he was unclear what the phrase "hands of the clock" meant. He would also point to his head during testing as a means to express difficulties expressing himself. Large-print options were offered; however, he reported that these were not necessary for vision purposes. Overall, Mr. Kosh was cooperative with the clinical interview and subsequent testing procedures.   Adequacy of Effort: The validity of neuropsychological testing is limited by the extent to which the individual being tested may be assumed to have exerted adequate effort during testing. Mr. Deery expressed his intention to perform to the best of his abilities and exhibited adequate task engagement and persistence. However, scores across stand-alone and embedded performance validity measures were consistently below expectation. However, this is believed to represent a combination of true cognitive dysfunction stemming from his stroke, as well as him being tested in his non-native language.  Test Results: Mr. Racca was notably disoriented at the time of the current evaluation. He was unable to state his age, birth date, address, or phone number. He was also unsure of the current year, month, date, day of the week, or current time. Finally, he was unsure for the reason of the current appointment, as well as the current state which he was in.  Intellectual abilities based upon educational and vocational attainment were estimated to be in the average range. Premorbid abilities were estimated to be within the exceptionally low range based upon a  single-word reading test.   Processing speed was exceptionally low. Basic attention was  exceptionally low. More complex attention (e.g., working memory) was also exceptionally low. Executive functioning was exceptionally low.  Receptive language abilities were unable to be directly assessed. Assessed expressive language (e.g., verbal fluency and confrontation naming) was exceptionally low.     Assessed visuospatial/visuoconstructional abilities were exceptionally low. On his drawing of a clock, he placed the numbers 1-8 in inappropriate locations (e.g., he placed 1 where 6 should be). He was unable to draw the hands of the clock. Points were lost on his drawing of a complex figure due to noted visual distortions.     Learning (i.e., encoding) of novel verbal and visual information was exceptionally low. Spontaneous delayed recall (i.e., retrieval) of previously learned information was also exceptionally low. Retention rates were 0% across a story learning task, 25% (raw score of 1) across a list learning task, and 0% across a complex figure drawing task. Performance across recognition tasks was poor, suggesting minimal evidence for information consolidation.   Results of emotional screening instruments suggested that recent symptoms of generalized anxiety were in the moderate range, while symptoms of depression were within the severe range. A screening instrument assessing recent sleep quality suggested the presence of moderate sleep dysfunction.  Tables of Scores:   Note: This summary of test scores accompanies the interpretive report and should not be considered in isolation without reference to the appropriate sections in the text. Descriptors are based on appropriate normative data and may be adjusted based on clinical judgment. The terms "impaired" and "within normal limits (WNL)" are used when a more specific level of functioning cannot be determined.       Effort Testing:   DESCRIPTOR         Rey 15:       Free Recall --- --- Below Expectation    Combined Score --- --- Below Expectation  Dot Counting Test: --- --- Discontinued  RBANS Effort Index: --- --- Below Expectation  WAIS-IV Reliable Digit Span: --- --- Below Expectation       Orientation:      Raw Score Percentile   NAB Orientation, Form 1 6/29 --- ---       Cognitive Screening:          RBANS, Form A: Standard Score/ Scaled Score Percentile   Total Score 42 <1 Exceptionally Low  Immediate Memory 44 <1 Exceptionally Low    List Learning 2 <1 Exceptionally Low    Story Memory 1 <1 Exceptionally Low  Visuospatial/Constructional 50 <1 Exceptionally Low    Figure Copy 1 <1 Exceptionally Low    Line Orientation 4/20 <2 Exceptionally Low  Language 40 <1 Exceptionally Low    Picture Naming 4/10 <2 Exceptionally Low    Semantic Fluency 1 <1 Exceptionally Low  Attention 46 <1 Exceptionally Low    Digit Span 3 1 Exceptionally Low    Coding 1 <1 Exceptionally Low  Delayed Memory 40 <1 Exceptionally Low    List Recall 1/10 <2 Exceptionally Low    List Recognition 8/20 <2 Exceptionally Low    Story Recall 1 <1 Exceptionally Low    Story Recognition 2/12 <1 Exceptionally Low    Figure Recall 1 <1 Exceptionally Low    Figure Recognition 0/8 1 Exceptionally Low       Intellectual Functioning:           Standard Score Percentile   Test of Premorbid Functioning: 52 <1 Exceptionally Low       Attention/Executive Function:  Trail Making Test (TMT): Raw Score (T Score) Percentile     Part A Discontinued --- Impaired    Part B Discontinued --- Impaired         Scaled Score Percentile   WAIS-IV Digit Span: 1 <1 Exceptionally Low    Forward 2 <1 Exceptionally Low    Backward 2 <1 Exceptionally Low    Sequencing 2 <1 Exceptionally Low       Visuospatial/Visuoconstruction:      Raw Score Percentile   Clock Drawing: 2/10 --- Impaired        Scaled Score Percentile   WAIS-IV Block Design: 2 <1  Exceptionally Low       Mood and Personality:      Raw Score Percentile   Beck Depression Inventory - II: 38 --- Severe  PROMIS Anxiety Questionnaire: 27 --- Moderate       Additional Questionnaires:      Raw Score Percentile   PROMIS Sleep Disturbance Questionnaire: 31 --- Moderate   Informed Consent and Coding/Compliance:   Mr. Briner was provided with a verbal description of the nature and purpose of the present neuropsychological evaluation. Also reviewed were the foreseeable risks and/or discomforts and benefits of the procedure, limits of confidentiality, and mandatory reporting requirements of this provider. The patient was given the opportunity to ask questions and receive answers about the evaluation. Oral consent to participate was provided by the patient.   This evaluation was conducted by Newman Nickels, Ph.D., licensed clinical neuropsychologist. Mr. Mroczkowski completed a comprehensive clinical interview with Dr. Milbert Coulter, billed as one unit [phone], and 135 minutes of cognitive testing and scoring, billed as one unit [phone] and four additional units [id number]. Psychometrist Wallace Keller, B.S., assisted Dr. Milbert Coulter with test administration and scoring procedures. As a separate and discrete service, Dr. Milbert Coulter spent a total of 180 minutes in interpretation and report writing billed as one unit [phone] and two units [id number].

## 2020-05-19 ENCOUNTER — Other Ambulatory Visit: Payer: Self-pay

## 2020-05-19 ENCOUNTER — Ambulatory Visit (INDEPENDENT_AMBULATORY_CARE_PROVIDER_SITE_OTHER): Payer: Medicaid Other | Admitting: Psychology

## 2020-05-19 DIAGNOSIS — F01A0 Vascular dementia, mild, without behavioral disturbance, psychotic disturbance, mood disturbance, and anxiety: Secondary | ICD-10-CM

## 2020-05-19 DIAGNOSIS — F0150 Vascular dementia without behavioral disturbance: Secondary | ICD-10-CM

## 2020-05-19 DIAGNOSIS — I6381 Other cerebral infarction due to occlusion or stenosis of small artery: Secondary | ICD-10-CM

## 2020-05-19 DIAGNOSIS — I639 Cerebral infarction, unspecified: Secondary | ICD-10-CM

## 2020-05-19 NOTE — Patient Instructions (Signed)
Recommendations: Should a clinician in his immediate area who has access to French-speaking cognitive assessments and normative data be identified, a repeat evaluation in this format may be beneficial so that the caveat of him being tested in a non-native language can be mitigated. This would likely offer improved diagnostic clarity.   I agree with Dr. Tomi Likens that it would be difficult for Mr. Chittenden to find and maintain competitive employment based upon ongoing cognitive deficits. He could consider applying for disability benefits if he has not already done so.   Mr. Hoshino is encouraged to continue working with outpatient speech and occupational therapists in efforts to promote cognitive recovery stemming from his stroke.  Topamax/topiramate has well-known cognitive side effects which could be influencing his current presentation to a mild extent. He could discuss this medication with his prescribing physician and see if there are alternative options for headache management.   A combination of medication and psychotherapy has been shown to be most effective at treating symptoms of anxiety and depression. As such, Mr. Datta is encouraged to speak with his prescribing physician regarding medication adjustments to optimally manage these symptoms. His level of cognitive impairment may make it challenging to engage in individual psychotherapy. However, if a French-speaking individual was found in the area, he could attempt short-term treatment. He would benefit from an active and collaborative therapeutic environment, rather than one purely supportive in nature. Recommended treatment modalities include Cognitive Behavioral Therapy (CBT) or Acceptance and Commitment Therapy (ACT).  Mr. Neyhart is encouraged to attend to lifestyle factors for brain health (e.g., regular physical exercise, good nutrition habits, regular participation in cognitively-stimulating activities, and general stress management  techniques), which are likely to have benefits for both emotional adjustment and cognition. In fact, in addition to promoting good general health, regular exercise incorporating aerobic activities (e.g., brisk walking, jogging, cycling, etc.) has been demonstrated to be a very effective treatment for depression and stress, with similar efficacy rates to both antidepressant medication and psychotherapy. Optimal control of vascular risk factors (including safe cardiovascular exercise and adherence to dietary recommendations) is encouraged.  Performance across neurocognitive testing is not a strong predictor of an individual's safety operating a motor vehicle. However, given the diffuse and severe nature of cognitive impairment across testing, I would recommend that he not operate a motorized vehicle. Should his family wish to pursue a formalized driving evaluation, they would be encouraged to [employer] in [place], [place] at [phone].Another option would be through [facility]; however, the latter would likely require a referral from a medical doctor. [facility] can be reached directly at [phone].  It will be important for Mr. Bowlby to have another person with him when in situations where he may need to process information, weigh the pros and cons of different options, and make decisions, in order to ensure that he fully understands and recalls all information to be considered.  When learning new information, he would benefit from information being broken up into small, manageable pieces. He may also find it helpful to articulate the material in his own words and in a context to promote encoding at the onset of a new task. This material may need to be repeated multiple times to promote encoding.  Memory can be improved using internal strategies such as rehearsal, repetition, chunking, mnemonics, association, and imagery. External strategies such as written  notes in a consistently used memory journal, visual and nonverbal auditory cues such as a calendar on the refrigerator or appointments with  alarm, such as on a cell phone, can also help maximize recall.    To address problems with processing speed, he may wish to consider:   -Ensuring that he is alerted when essential material or instructions are being presented   -Adjusting the speed at which new information is presented   -Allowing for more time in comprehending, processing, and responding in conversation  To address problems with fluctuating attention, he may wish to consider:   -Avoiding external distractions when needing to concentrate   -Limiting exposure to fast paced environments with multiple sensory demands   -Writing down complicated information and using checklists   -Attempting and completing one task at a time (i.e., no multi-tasking)   -Verbalizing aloud each step of a task to maintain focus   -Reducing the amount of information considered at one time

## 2020-05-19 NOTE — Progress Notes (Signed)
   Neuropsychology Feedback Session Brandon Summers. [facility] [place] Department of Neurology  Reason for Referral:   Brandon Summers a 49 y.o. 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   Feedback:   [name] completed a comprehensive neuropsychological evaluation on 05/12/2020. Please refer to that encounter for the full report and recommendations. There are several caveats which are important to note surrounding test interpretation. First, it is important to note that testing was completed in [place] rather than [name] native language [place]). Despite [name] being able to converse in English well, there remains the potential for test scores to be attenuated due to difficulties comprehending and conversing in a non-native language. Second, scores across stand-alone and embedded performance validity measures were consistently below expectation. However, this is believed to represent a combination of true cognitive dysfunction, as well as him being tested in his non-native language. Overall, [name] pattern of performance is suggestive of diffuse cognitive impairment generally in the exceptionally low normative ranges. No relative strengths were able to be identified across testing. 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 He did acknowledge requiring assistance from his daughter in performing instrumental ADLs, which would indicate the potential for a milder presentation of a major vascular neurocognitive disorder (i.e., vascular dementia). However, the former diagnosis appears more prudent at the present time given the aforementioned factors, as well as the unknown  contribution of mood and sleep dysfunction to his current clinical presentation.  [name] was unaccompanied during the current telephone call. He was at his residence while I was within my office. Content of the current session focused on the results of his neuropsychological evaluation. [name] was given the opportunity to ask questions and his questions were answered. He was encouraged to reach out should additional questions arise. A copy of his report was mailed at the conclusion of the visit.      Less than 16 minutes were spent conducting the current feedback session with [name].

## 2020-06-03 NOTE — Progress Notes (Signed)
NEUROLOGY FOLLOW UP OFFICE NOTE  Brandon Summers [id number]  HISTORY OF PRESENT ILLNESS: Brandon Summers is a 49 year oldright-handed black man, smoker,who follows up for stroke.  UPDATE: Current medications:  ASA 81mg  daily; Lipitor 40mg  daily Implantable loop recorder has not identified a fib.  He underwent neuropsychological testing on 05/12/2020 which demonstrated mild vascular neurocognitive disorder with deficits believed to hinder patient's ability to maintain competitive employment.    HISTORY: He was admitted to [facility] on 05/21/19 after presenting with blurredvision, headache and right sided numbness. CT of head revealed no acute stroke. MRI of brain, however, showed an acute large left thalamic infarct as well as significant chronic small vessel ischemic changes. CTA of head and neck revealed no aneurysm or emergent large vessel stenosis or occlusion. 2D echo demonstrated EF 55-60% with no cardiac source of embolus. TEE demonstrated no PFO or other cardiac source of embolus. LDL was 114 and Hgb A1c was 5.7. He was discharged onASA 81mg and Plavix 75mg  daily for 3 weeks, followed by ASA alone. He was started on Lipitor 40mg  daily. He was advised to stop smoking.    Following discharge, he reported some blurred vision. Right hand hurts but all extremities feel "tired". He reports that he is drooling from both sides of his face. He says face feels "different" but he denies numbness and tingling sensation. He says when he talks, he tends to talk slowly because if he talks, he gets a left sided headache. He has cut down on cigarettes, now smoking 1 pack over 4 days. Seen by cardiology to have loop recorder insertion  PAST MEDICAL HISTORY: Past Medical History:  Diagnosis Date  . Cough   . CVA (cerebral vascular accident) 05/21/2019   Left thalamic stroke  . HLD (hyperlipidemia) 05/23/2019  . 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 Nonintractable  headache 04/15/2020  . Pacemaker   . Right sided weakness 09/09/2019  . Shortness of breath   . Visual problems     MEDICATIONS: Outpatient Encounter Medications as of 06/04/2020  Medication Sig Note  . albuterol (VENTOLIN HFA) 108 (90 Base) MCG/ACT inhaler Inhale 2 puffs into the lungs every 6 (six) hours as needed for wheezing or shortness of breath. 04/14/2020: As needed.   05/14/2020 aspirin EC 81 MG tablet Take 1 tablet (81 mg total) by mouth daily.   04/17/2020 atorvastatin (LIPITOR) 40 MG tablet Take 1 tablet (40 mg total) by mouth daily at 6 PM.   . gabapentin (NEURONTIN) 300 MG capsule Take 1 capsule (300 mg total) by mouth 2 (two) times daily. (Patient not taking: Reported on 06/04/2020)   . topiramate (TOPAMAX) 50 MG tablet Take 1 tablet (50 mg total) by mouth 2 (two) times daily. (Patient not taking: Reported on 06/04/2020)    No facility-administered encounter medications on file as of 06/04/2020.   ALLERGIES: No Known Allergies  FAMILY HISTORY: Family History  Problem Relation Age of Onset  . Hypertension Mother   . Stroke Mother    SOCIAL HISTORY: Social History   Socioeconomic History  . Marital status: Married    Spouse name: Not on file  . Number of children: Not on file  . Years of education: 81  . Highest education level: High school graduate  Occupational History  . Occupation: Chef  Tobacco Use  . Smoking status: Former Smoker    Types: Cigarettes    Quit date: 05/21/2019    Years since quitting: 1.0  . Smokeless tobacco: Never Used  Vaping Use  . Vaping Use: Never used  Substance and Sexual Activity  . Alcohol use: Yes    Comment: occasional  . Drug use: No  . Sexual activity: Not Currently  Other Topics Concern  . Not on file  Social History Narrative   Marital status: separated; moved from [place] to [place] 1998   Children: 2 children; no grandchildren.   Employment: chef   Tobacco: 1/2ppd   Alcohol: none   Drugs: none   Exercise: none.    no Caffeine   Social  Determinants of Health   Financial Resource Strain:   . Difficulty of Paying Living Expenses:   Food Insecurity:   . Worried About Programme researcher, broadcasting/film/video in the Last Year:   . Barista in the Last Year:   Transportation Needs:   . Freight forwarder (Medical):   Marland Kitchen Lack of Transportation (Non-Medical):   Physical Activity:   . Days of Exercise per Week:   . Minutes of Exercise per Session:   Stress:   . Feeling of Stress :   Social Connections:   . Frequency of Communication with Friends and Family:   . Frequency of Social Gatherings with Friends and Family:   . Attends Religious Services:   . Active Member of Clubs or Organizations:   . Attends Banker Meetings:   Marland Kitchen Marital Status:   Intimate Partner Violence:   . Fear of Current or Ex-Partner:   . Emotionally Abused:   Marland Kitchen Physically Abused:   . Sexually Abused:     PHYSICAL EXAM: Blood pressure 121/81, pulse 68, height 6\' 2"  (1.88 m), weight 181 lb 9.6 oz (82.4 kg), SpO2 99 %. General: No acute distress.  Patient appears well-groomed.   Head:  Normocephalic/atraumatic Eyes:  Fundi examined but not visualized Neck: supple, no paraspinal tenderness, full range of motion Heart:  Regular rate and rhythm Lungs:  Clear to auscultation bilaterally Back: No paraspinal tenderness Neurological Exam: alert and oriented to person, place, and time. Attention span and concentration reduced, memory intact.  Some difficulty repeating and mild non-fluent speech.  Able to follow commands.  CN II-XII intact. Bulk and tone normal, muscle strength 5/5 throughout.  Sensation to light touch, temperature and vibration intact.  Deep tendon reflexes 2+ throughout, toes downgoing.  Finger to nose and heel to shin testing intact.  Gait normal, Romberg negative.  IMPRESSION: 1.  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  Hyperlipidemia 5.  Tobacco use disorder  PLAN: 1.  Secondary stroke prevention management as managed by PCP:  - ASA 81mg  daily  -  Atorvastatin 80mg  (LDL goal less than 70)  -  Blood pressure control  -  Glycemic control (Hgb A1c goal less than 7) 2.  Recommend to apply for long-term disability.   3.  Follow up in 6 months.  , DO  CC: , FNP

## 2020-06-04 ENCOUNTER — Encounter: Payer: Self-pay | Admitting: Neurology

## 2020-06-04 ENCOUNTER — Ambulatory Visit (INDEPENDENT_AMBULATORY_CARE_PROVIDER_SITE_OTHER): Payer: Medicaid Other | Admitting: Neurology

## 2020-06-04 ENCOUNTER — Other Ambulatory Visit: Payer: Self-pay

## 2020-06-04 VITALS — BP 121/81 | HR 68 | Ht 74.0 in | Wt 181.6 lb

## 2020-06-04 DIAGNOSIS — I69320 Aphasia following cerebral infarction: Secondary | ICD-10-CM | POA: Diagnosis not present

## 2020-06-04 DIAGNOSIS — F17200 Nicotine dependence, unspecified, uncomplicated: Secondary | ICD-10-CM

## 2020-06-04 DIAGNOSIS — F0150 Vascular dementia without behavioral disturbance: Secondary | ICD-10-CM | POA: Diagnosis not present

## 2020-06-04 DIAGNOSIS — F01A0 Vascular dementia, mild, without behavioral disturbance, psychotic disturbance, mood disturbance, and anxiety: Secondary | ICD-10-CM

## 2020-06-04 DIAGNOSIS — I639 Cerebral infarction, unspecified: Secondary | ICD-10-CM | POA: Diagnosis not present

## 2020-06-04 DIAGNOSIS — I6381 Other cerebral infarction due to occlusion or stenosis of small artery: Secondary | ICD-10-CM

## 2020-06-04 NOTE — Patient Instructions (Addendum)
1.  Continue aspirin 81mg  daily 2.  Continue atorvastatin 40mg  daily 3.  Follow up with applying for disability 4.  Follow up in 6 months.

## 2020-06-08 ENCOUNTER — Ambulatory Visit (INDEPENDENT_AMBULATORY_CARE_PROVIDER_SITE_OTHER): Payer: Medicaid Other | Admitting: *Deleted

## 2020-06-08 DIAGNOSIS — I639 Cerebral infarction, unspecified: Secondary | ICD-10-CM

## 2020-06-08 LAB — CUP PACEART REMOTE DEVICE CHECK
Date Time Interrogation Session: 20210711230401
Implantable Pulse Generator Implant Date: 20200828

## 2020-06-09 NOTE — Progress Notes (Signed)
Carelink Summary Report / Loop Recorder 

## 2020-07-09 ENCOUNTER — Ambulatory Visit (INDEPENDENT_AMBULATORY_CARE_PROVIDER_SITE_OTHER): Payer: Medicaid Other | Admitting: *Deleted

## 2020-07-09 DIAGNOSIS — I639 Cerebral infarction, unspecified: Secondary | ICD-10-CM | POA: Diagnosis not present

## 2020-07-09 LAB — CUP PACEART REMOTE DEVICE CHECK
Date Time Interrogation Session: 20210811230246
Implantable Pulse Generator Implant Date: 20200828

## 2020-07-13 NOTE — Progress Notes (Signed)
Carelink Summary Report / Loop Recorder 

## 2020-08-10 ENCOUNTER — Ambulatory Visit (INDEPENDENT_AMBULATORY_CARE_PROVIDER_SITE_OTHER): Payer: Medicaid Other | Admitting: *Deleted

## 2020-08-10 DIAGNOSIS — I639 Cerebral infarction, unspecified: Secondary | ICD-10-CM | POA: Diagnosis not present

## 2020-08-10 LAB — CUP PACEART REMOTE DEVICE CHECK
Date Time Interrogation Session: 20210911230351
Implantable Pulse Generator Implant Date: 20200828

## 2020-08-11 NOTE — Progress Notes (Signed)
Carelink Summary Report / Loop Recorder 

## 2020-08-14 ENCOUNTER — Other Ambulatory Visit: Payer: Self-pay | Admitting: Internal Medicine

## 2020-08-14 DIAGNOSIS — E785 Hyperlipidemia, unspecified: Secondary | ICD-10-CM

## 2020-09-08 ENCOUNTER — Telehealth: Payer: Self-pay | Admitting: Neurology

## 2020-09-08 NOTE — Telephone Encounter (Signed)
Per pt daughter pt has a letter from another provider he should be ok.

## 2020-09-08 NOTE — Telephone Encounter (Signed)
Patient came to office. He needs paperwork filled out stating that he is not able to work. He was approved for disability. Please call. You can also call [phone] Ezzard Flax- patients daughter, came with patient to office, patient gave consent to speak with her).

## 2020-09-09 LAB — CUP PACEART REMOTE DEVICE CHECK
Date Time Interrogation Session: 20211012230106
Implantable Pulse Generator Implant Date: 20200828

## 2020-09-10 ENCOUNTER — Ambulatory Visit (INDEPENDENT_AMBULATORY_CARE_PROVIDER_SITE_OTHER): Payer: Medicaid Other

## 2020-09-10 DIAGNOSIS — I639 Cerebral infarction, unspecified: Secondary | ICD-10-CM | POA: Diagnosis not present

## 2020-09-11 ENCOUNTER — Encounter: Payer: Self-pay | Admitting: Family Medicine

## 2020-09-15 NOTE — Progress Notes (Signed)
Carelink Summary Report / Loop Recorder 

## 2020-09-30 ENCOUNTER — Other Ambulatory Visit: Payer: Self-pay

## 2020-09-30 ENCOUNTER — Encounter: Payer: Self-pay | Admitting: Family Medicine

## 2020-09-30 ENCOUNTER — Ambulatory Visit (INDEPENDENT_AMBULATORY_CARE_PROVIDER_SITE_OTHER): Payer: Medicaid Other | Admitting: Family Medicine

## 2020-09-30 VITALS — BP 130/93 | HR 71 | Temp 98.0°F | Resp 18 | Ht 72.0 in | Wt 181.6 lb

## 2020-09-30 DIAGNOSIS — M79604 Pain in right leg: Secondary | ICD-10-CM

## 2020-09-30 DIAGNOSIS — F411 Generalized anxiety disorder: Secondary | ICD-10-CM | POA: Diagnosis not present

## 2020-09-30 DIAGNOSIS — R519 Headache, unspecified: Secondary | ICD-10-CM | POA: Diagnosis not present

## 2020-09-30 DIAGNOSIS — R21 Rash and other nonspecific skin eruption: Secondary | ICD-10-CM

## 2020-09-30 DIAGNOSIS — L299 Pruritus, unspecified: Secondary | ICD-10-CM | POA: Diagnosis not present

## 2020-09-30 DIAGNOSIS — R531 Weakness: Secondary | ICD-10-CM

## 2020-09-30 DIAGNOSIS — Z8673 Personal history of transient ischemic attack (TIA), and cerebral infarction without residual deficits: Secondary | ICD-10-CM | POA: Diagnosis not present

## 2020-09-30 DIAGNOSIS — Z09 Encounter for follow-up examination after completed treatment for conditions other than malignant neoplasm: Secondary | ICD-10-CM | POA: Diagnosis not present

## 2020-09-30 MED ORDER — BUSPIRONE HCL 10 MG PO TABS: 10.0000 mg | ORAL_TABLET | Freq: Two times a day (BID) | ORAL | 3 refills | Status: DC

## 2020-09-30 MED ORDER — TRIAMCINOLONE ACETONIDE 0.1 % EX CREA: 1.0000 "application " | TOPICAL_CREAM | Freq: Two times a day (BID) | CUTANEOUS | 3 refills | Status: DC

## 2020-09-30 NOTE — Patient Instructions (Signed)
Generalized Anxiety Disorder, Adult Generalized anxiety disorder (GAD) is a mental health disorder. People with this condition constantly worry about everyday events. Unlike normal anxiety, worry related to GAD is not triggered by a specific event. These worries also do not fade or get better with time. GAD interferes with life functions, including relationships, work, and school. GAD can vary from mild to severe. People with severe GAD can have intense waves of anxiety with physical symptoms (panic attacks). What are the causes? The exact cause of GAD is not known. What increases the risk? This condition is more likely to develop in:  Women.  People who have a family history of anxiety disorders.  People who are very shy.  People who experience very stressful life events, such as the death of a loved one.  People who have a very stressful family environment. What are the signs or symptoms? People with GAD often worry excessively about many things in their lives, such as their health and family. They may also be overly concerned about:  Doing well at work.  Being on time.  Natural disasters.  Friendships. Physical symptoms of GAD include:  Fatigue.  Muscle tension or having muscle twitches.  Trembling or feeling shaky.  Being easily startled.  Feeling like your heart is pounding or racing.  Feeling out of breath or like you cannot take a deep breath.  Having trouble falling asleep or staying asleep.  Sweating.  Nausea, diarrhea, or irritable bowel syndrome (IBS).  Headaches.  Trouble concentrating or remembering facts.  Restlessness.  Irritability. How is this diagnosed? Your health care provider can diagnose GAD based on your symptoms and medical history. You will also have a physical exam. The health care provider will ask specific questions about your symptoms, including how severe they are, when they started, and if they come and go. Your health care  provider may ask you about your use of alcohol or drugs, including prescription medicines. Your health care provider may refer you to a mental health specialist for further evaluation. Your health care provider will do a thorough examination and may perform additional tests to rule out other possible causes of your symptoms. To be diagnosed with GAD, a person must have anxiety that:  Is out of his or her control.  Affects several different aspects of his or her life, such as work and relationships.  Causes distress that makes him or her unable to take part in normal activities.  Includes at least three physical symptoms of GAD, such as restlessness, fatigue, trouble concentrating, irritability, muscle tension, or sleep problems. Before your health care provider can confirm a diagnosis of GAD, these symptoms must be present more days than they are not, and they must last for six months or longer. How is this treated? The following therapies are usually used to treat GAD:  Medicine. Antidepressant medicine is usually prescribed for long-term daily control. Antianxiety medicines may be added in severe cases, especially when panic attacks occur.  Talk therapy (psychotherapy). Certain types of talk therapy can be helpful in treating GAD by providing support, education, and guidance. Options include: ? Cognitive behavioral therapy (CBT). People learn coping skills and techniques to ease their anxiety. They learn to identify unrealistic or negative thoughts and behaviors and to replace them with positive ones. ? Acceptance and commitment therapy (ACT). This treatment teaches people how to be mindful as a way to cope with unwanted thoughts and feelings. ? Biofeedback. This process trains you to manage your body's response (  physiological response) through breathing techniques and relaxation methods. You will work with a therapist while machines are used to monitor your physical symptoms.  Stress  management techniques. These include yoga, meditation, and exercise. A mental health specialist can help determine which treatment is best for you. Some people see improvement with one type of therapy. However, other people require a combination of therapies. Follow these instructions at home:  Take over-the-counter and prescription medicines only as told by your health care provider.  Try to maintain a normal routine.  Try to anticipate stressful situations and allow extra time to manage them.  Practice any stress management or self-calming techniques as taught by your health care provider.  Do not punish yourself for setbacks or for not making progress.  Try to recognize your accomplishments, even if they are small.  Keep all follow-up visits as told by your health care provider. This is important. Contact a health care provider if:  Your symptoms do not get better.  Your symptoms get worse.  You have signs of depression, such as: ? A persistently sad, cranky, or irritable mood. ? Loss of enjoyment in activities that used to bring you joy. ? Change in weight or eating. ? Changes in sleeping habits. ? Avoiding friends or family members. ? Loss of energy for normal tasks. ? Feelings of guilt or worthlessness. Get help right away if:  You have serious thoughts about hurting yourself or others. If you ever feel like you may hurt yourself or others, or have thoughts about taking your own life, get help right away. You can go to your nearest emergency department or call:  Your local emergency services (911 in the [place]  A suicide crisis helpline, such as the National Suicide Prevention Lifeline at 1-800-273-8255. This is open 24 hours a day. Summary  Generalized anxiety disorder (GAD) is a mental health disorder that involves worry that is not triggered by a specific event.  People with GAD often worry excessively about many things in their lives, such as their health and  family.  GAD may cause physical symptoms such as restlessness, trouble concentrating, sleep problems, frequent sweating, nausea, diarrhea, headaches, and trembling or muscle twitching.  A mental health specialist can help determine which treatment is best for you. Some people see improvement with one type of therapy. However, other people require a combination of therapies. This information is not intended to replace advice given to you by your health care provider. Make sure you discuss any questions you have with your health care provider. Document Revised: 10/27/2017 Document Reviewed: 10/04/2016 Elsevier Patient Education  2020 Elsevier Inc.   Buspirone tablets What is this medicine? BUSPIRONE (byoo SPYE rone) is used to treat anxiety disorders. This medicine may be used for other purposes; ask your health care provider or pharmacist if you have questions. COMMON BRAND NAME(S): BuSpar What should I tell my health care provider before I take this medicine? They need to know if you have any of these conditions:  kidney or liver disease  an unusual or allergic reaction to buspirone, other medicines, foods, dyes, or preservatives  pregnant or trying to get pregnant  breast-feeding How should I use this medicine? Take this medicine by mouth with a glass of water. Follow the directions on the prescription label. You may take this medicine with or without food. To ensure that this medicine always works the same way for you, you should take it either always with or always without food. Take your doses at   regular intervals. Do not take your medicine more often than directed. Do not stop taking except on the advice of your doctor or health care professional. Talk to your pediatrician regarding the use of this medicine in children. Special care may be needed. Overdosage: If you think you have taken too much of this medicine contact a poison control center or emergency room at once. NOTE: This  medicine is only for you. Do not share this medicine with others. What if I miss a dose? If you miss a dose, take it as soon as you can. If it is almost time for your next dose, take only that dose. Do not take double or extra doses. What may interact with this medicine? Do not take this medicine with any of the following medications:  linezolid  MAOIs like Carbex, Eldepryl, Marplan, Nardil, and Parnate  methylene blue  procarbazine This medicine may also interact with the following medications:  diazepam  digoxin  diltiazem  erythromycin  grapefruit juice  haloperidol  medicines for mental depression or mood problems  medicines for seizures like carbamazepine, phenobarbital and phenytoin  nefazodone  other medications for anxiety  rifampin  ritonavir  some antifungal medicines like itraconazole, ketoconazole, and voriconazole  verapamil  warfarin This list may not describe all possible interactions. Give your health care provider a list of all the medicines, herbs, non-prescription drugs, or dietary supplements you use. Also tell them if you smoke, drink alcohol, or use illegal drugs. Some items may interact with your medicine. What should I watch for while using this medicine? Visit your doctor or health care professional for regular checks on your progress. It may take 1 to 2 weeks before your anxiety gets better. You may get drowsy or dizzy. Do not drive, use machinery, or do anything that needs mental alertness until you know how this drug affects you. Do not stand or sit up quickly, especially if you are an older patient. This reduces the risk of dizzy or fainting spells. Alcohol can make you more drowsy and dizzy. Avoid alcoholic drinks. What side effects may I notice from receiving this medicine? Side effects that you should report to your doctor or health care professional as soon as possible:  blurred vision or other vision changes  chest  pain  confusion  difficulty breathing  feelings of hostility or anger  muscle aches and pains  numbness or tingling in hands or feet  ringing in the ears  skin rash and itching  vomiting  weakness Side effects that usually do not require medical attention (report to your doctor or health care professional if they continue or are bothersome):  disturbed dreams, nightmares  headache  nausea  restlessness or nervousness  sore throat and nasal congestion  stomach upset This list may not describe all possible side effects. Call your doctor for medical advice about side effects. You may report side effects to FDA at 1-800-FDA-1088. Where should I keep my medicine? Keep out of the reach of children. Store at room temperature below 30 degrees C (86 degrees F). Protect from light. Keep container tightly closed. Throw away any unused medicine after the expiration date. NOTE: This sheet is a summary. It may not cover all possible information. If you have questions about this medicine, talk to your doctor, pharmacist, or health care provider.  2020 Elsevier/Gold Standard (2010-06-24 18:06:11)  

## 2020-09-30 NOTE — Progress Notes (Signed)
[facility]   Established Patient Office Visit  Subjective:  Patient ID: Brandon Summers, male    DOB: 05/03/1971  Age: 49 y.o. MRN: [id number]  CC:  Chief Complaint  Patient presents with  . Follow-up    HPI Brandon Summers is a 49 year old male who presents for Follow Up today.    Patient Active Problem List   Diagnosis Date Noted  . 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 Nonintractable headache 04/15/2020  . Pain of right lower extremity 04/15/2020  . Tobacco use 09/09/2019  . Cough 09/09/2019  . Shortness of breath 09/09/2019  . Right sided weakness 09/09/2019  . Abnormal PFT 08/12/2019  . HLD (hyperlipidemia) 05/23/2019  . CVA (cerebral vascular accident) (HCC) 05/21/2019    Current Status: Since his last office visit, he is doing well with no complaints. He continues to follow up with Neurologist and Cardiologist as needed. He has recently had defibrillator placed on 07/26/2019. He denies fevers, chills, fatigue, recent infections, weight loss, and night sweats. He has not had any headaches, visual changes, dizziness, and falls. No chest pain, heart palpitations, cough and shortness of breath reported. Denies GI problems such as nausea, vomiting, diarrhea, and constipation. He has no reports of blood in stools, dysuria and hematuria. No depression or anxiety reported. He is taking all medications as prescribed. He denies pain today.    Past Medical History:  Diagnosis Date  . Cough   . CVA (cerebral vascular accident) 05/21/2019   Left thalamic stroke  . HLD (hyperlipidemia) 05/23/2019  . 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 Nonintractable headache 04/15/2020  . Pacemaker   . Right sided weakness 09/09/2019  . Shortness of breath   . Visual problems     Past Surgical History:  Procedure Laterality Date  . BUBBLE STUDY  05/23/2019   Procedure: BUBBLE STUDY;  Surgeon: Chrystie NoseHilty, Kenneth C, MD;   [facility];  Service: Cardiovascular;;  . implantable loop recorder placement  07/26/2019   MDT Reveal [id number] implanted by Dr Johney FrameAllred for cryptogenic stroke  . TEE WITHOUT CARDIOVERSION N/A 05/23/2019   Procedure: TRANSESOPHAGEAL ECHOCARDIOGRAM (TEE);  Surgeon: Chrystie NoseHilty, Kenneth C, MD;  [facility];  Service: Cardiovascular;  Laterality: N/A;    Family History  Problem Relation Age of Onset  . Hypertension Mother   . Stroke Mother     Social History   Socioeconomic History  . Marital status: Married    Spouse name: Not on file  . Number of children: Not on file  . Years of education: 1112  . Highest education level: High school graduate  Occupational History  . Occupation: Chef  Tobacco Use  . Smoking status: Former Smoker    Types: Cigarettes    Quit date: 05/21/2019    Years since quitting: 1.3  . Smokeless tobacco: Never Used  Vaping Use  . Vaping Use: Never used  Substance and Sexual Activity  . Alcohol use: Yes    Comment: occasional  . Drug use: No  . Sexual activity: Not Currently  Other Topics Concern  . Not on file  Social History Narrative   Marital status: separated; [place]   Children: 2 children; no grandchildren.   Employment: chef   Tobacco: 1/2ppd   Alcohol: none   Drugs: none   Exercise: none.    no Caffeine   Social Determinants of Corporate investment bankerHealth   Financial Resource Strain:   .  Difficulty of Paying Living Expenses: Not on file  Food Insecurity:   . Worried About Programme researcher, broadcasting/film/video in the Last Year: Not on file  . Ran Out of Food in the Last Year: Not on file  Transportation Needs:   . Lack of Transportation (Medical): Not on file  . Lack of Transportation (Non-Medical): Not on file  Physical Activity:   . Days of Exercise per Week: Not on file  . Minutes of Exercise per Session: Not on file  Stress:   . Feeling of Stress : Not on file  Social Connections:   . Frequency of Communication with Friends and  Family: Not on file  . Frequency of Social Gatherings with Friends and Family: Not on file  . Attends Religious Services: Not on file  . Active Member of Clubs or Organizations: Not on file  . Attends Banker Meetings: Not on file  . Marital Status: Not on file  Intimate Partner Violence:   . Fear of Current or Ex-Partner: Not on file  . Emotionally Abused: Not on file  . Physically Abused: Not on file  . Sexually Abused: Not on file    Outpatient Medications Prior to Visit  Medication Sig Dispense Refill  . albuterol (VENTOLIN HFA) 108 (90 Base) MCG/ACT inhaler Inhale 2 puffs into the lungs every 6 (six) hours as needed for wheezing or shortness of breath. 8 g 11  . atorvastatin (LIPITOR) 40 MG tablet TAKE 1 TABLET (40 MG TOTAL) BY MOUTH DAILY AT 6 PM. 90 tablet 0  . gabapentin (NEURONTIN) 300 MG capsule Take 1 capsule (300 mg total) by mouth 2 (two) times daily. 60 capsule 6  . topiramate (TOPAMAX) 50 MG tablet Take 1 tablet (50 mg total) by mouth 2 (two) times daily. 60 tablet 6   No facility-administered medications prior to visit.    No Known Allergies  ROS Review of Systems  Constitutional: Negative.   HENT: Negative.   Eyes: Negative.   Respiratory: Negative.   Cardiovascular: Negative.   Gastrointestinal: Negative.   Endocrine: Negative.   Genitourinary: Negative.   Musculoskeletal: Positive for arthralgias (generalized joint pain).  Skin: Negative.   Allergic/Immunologic: Negative.   Neurological: Positive for dizziness (occasional ), weakness (generalized post stroke) and headaches (occasional ).  Hematological: Negative.   Psychiatric/Behavioral: Negative.       Objective:    Physical Exam Vitals and nursing note reviewed.  Constitutional:      Appearance: Normal appearance.  HENT:     Head: Normocephalic and atraumatic.     Nose: Nose normal.     Mouth/Throat:     Mouth: Mucous membranes are moist.     Pharynx: Oropharynx is clear.    Cardiovascular:     Rate and Rhythm: Normal rate and regular rhythm.     Pulses: Normal pulses.     Heart sounds: Normal heart sounds.  Pulmonary:     Effort: Pulmonary effort is normal.     Breath sounds: Normal breath sounds.  Abdominal:     General: Abdomen is flat. Bowel sounds are normal.     Palpations: Abdomen is soft.  Musculoskeletal:        General: Normal range of motion.     Cervical back: Normal range of motion and neck supple.  Skin:    General: Skin is warm.  Neurological:     General: No focal deficit present.     Mental Status: He is alert and oriented to person, place,  and time.  Psychiatric:        Mood and Affect: Mood normal.        Behavior: Behavior normal.        Thought Content: Thought content normal.        Judgment: Judgment normal.    BP (!) 130/93 (BP Location: Right Arm, Patient Position: Sitting, Cuff Size: Normal)   Pulse 71   Temp 98 F (36.7 C) (Temporal)   Resp 18   Wt 181 lb 9.6 oz (82.4 kg)   SpO2 100%   BMI 23.32 kg/m  Wt Readings from Last 3 Encounters:  09/30/20 181 lb 9.6 oz (82.4 kg)  06/04/20 181 lb 9.6 oz (82.4 kg)  04/14/20 178 lb 3.2 oz (80.8 kg)    Health Maintenance Due  Topic Date Due  . Hepatitis C Screening  Never done  . HIV Screening  Never done    There are no preventive care reminders to display for this patient.  No results found for: TSH Lab Results  Component Value Date   WBC 5.6 10/13/2019   HGB 15.2 10/13/2019   HCT 49.7 10/13/2019   MCV 74.8 (L) 10/13/2019   PLT 251 10/13/2019   Lab Results  Component Value Date   NA 141 10/13/2019   K 4.3 10/13/2019   CO2 29 10/13/2019   GLUCOSE 113 (H) 10/13/2019   BUN 9 10/13/2019   CREATININE 0.97 10/13/2019   BILITOT 0.5 05/21/2019   ALKPHOS 52 05/21/2019   AST 24 05/21/2019   ALT 44 05/21/2019   PROT 6.0 (L) 05/21/2019   ALBUMIN 3.3 (L) 05/21/2019   CALCIUM 9.1 10/13/2019   ANIONGAP 7 10/13/2019   Lab Results  Component Value Date   CHOL  108 11/18/2019   Lab Results  Component Value Date   HDL 45.10 11/18/2019   Lab Results  Component Value Date   LDLCALC 46 11/18/2019   Lab Results  Component Value Date   TRIG 83.0 11/18/2019   Lab Results  Component Value Date   CHOLHDL 2 11/18/2019   Lab Results  Component Value Date   HGBA1C 5.7 (H) 05/22/2019      Assessment & Plan:   1. History of CVA (cerebrovascular accident) Stable. No signs or symptoms of recurrence noted or reported.  - CBC with Differential - Comprehensive metabolic panel - TSH - Lipid Panel - Vitamin B12 - Vitamin D, 25-hydroxy  2. Nonintractable headache, unspecified chronicity pattern, unspecified headache type Stable.   3. Generalized anxiety disorder - busPIRone (BUSPAR) 10 MG tablet; Take 1 tablet (10 mg total) by mouth 2 (two) times daily.  Dispense: 60 tablet; Refill: 3 - CBC with Differential - Comprehensive metabolic panel - TSH - Lipid Panel - Vitamin B12 - Vitamin D, 25-hydroxy  4. Right sided weakness  5. Generalized weakness  6. Pain of right lower extremity  7. Rash - triamcinolone cream (KENALOG) 0.1 %; Apply 1 application topically 2 (two) times daily.  Dispense: 30 g; Refill: 3  8. Itching - triamcinolone cream (KENALOG) 0.1 %; Apply 1 application topically 2 (two) times daily.  Dispense: 30 g; Refill: 3  9. Follow up He will follow up in 3 months.   Meds ordered this encounter  Medications  . busPIRone (BUSPAR) 10 MG tablet    Sig: Take 1 tablet (10 mg total) by mouth 2 (two) times daily.    Dispense:  60 tablet    Refill:  3  . triamcinolone cream (KENALOG) 0.1 %  Sig: Apply 1 application topically 2 (two) times daily.    Dispense:  30 g    Refill:  3    Orders Placed This Encounter  Procedures  . CBC with Differential  . Comprehensive metabolic panel  . TSH  . Lipid Panel  . Vitamin B12  . Vitamin D, 25-hydroxy    Referral Orders  No referral(s) requested today    Raliegh Ip,  MSN, FNP-BC [facility] [address] [place], [place] [address] [phone]   Problem List Items Addressed This Visit      Nervous and Auditory   Right sided weakness     Other   Nonintractable headache   Pain of right lower extremity    Other Visit Diagnoses    History of CVA (cerebrovascular accident)    -  Primary   Relevant Orders   CBC with Differential   Comprehensive metabolic panel   TSH   Lipid Panel   Vitamin B12   Vitamin D, 25-hydroxy   Generalized anxiety disorder       Relevant Medications   busPIRone (BUSPAR) 10 MG tablet   Other Relevant Orders   CBC with Differential   Comprehensive metabolic panel   TSH   Lipid Panel   Vitamin B12   Vitamin D, 25-hydroxy   Generalized weakness       Rash       Relevant Medications   triamcinolone cream (KENALOG) 0.1 %   Itching       Relevant Medications   triamcinolone cream (KENALOG) 0.1 %   Follow up          Meds ordered this encounter  Medications  . busPIRone (BUSPAR) 10 MG tablet    Sig: Take 1 tablet (10 mg total) by mouth 2 (two) times daily.    Dispense:  60 tablet    Refill:  3  . triamcinolone cream (KENALOG) 0.1 %    Sig: Apply 1 application topically 2 (two) times daily.    Dispense:  30 g    Refill:  3    Follow-up: No follow-ups on file.    Kallie Locks, FNP

## 2020-09-30 NOTE — Progress Notes (Signed)
C/o R side leg/arm pain and headaches, constant, "for awhile"

## 2020-10-01 ENCOUNTER — Encounter: Payer: Self-pay | Admitting: Family Medicine

## 2020-10-01 LAB — COMPREHENSIVE METABOLIC PANEL
ALT: 64 IU/L — ABNORMAL HIGH (ref 0–44)
AST: 28 IU/L (ref 0–40)
Albumin/Globulin Ratio: 2 (ref 1.2–2.2)
Albumin: 4.1 g/dL (ref 4.0–5.0)
Alkaline Phosphatase: 64 IU/L (ref 44–121)
BUN/Creatinine Ratio: 8 — ABNORMAL LOW (ref 9–20)
BUN: 8 mg/dL (ref 6–24)
Bilirubin Total: 0.4 mg/dL (ref 0.0–1.2)
CO2: 26 mmol/L (ref 20–29)
Calcium: 9.2 mg/dL (ref 8.7–10.2)
Chloride: 104 mmol/L (ref 96–106)
Creatinine, Ser: 0.98 mg/dL (ref 0.76–1.27)
GFR calc Af Amer: 105 mL/min/{1.73_m2} (ref >59)
GFR calc non Af Amer: 91 mL/min/{1.73_m2} (ref >59)
Globulin, Total: 2.1 g/dL (ref 1.5–4.5)
Glucose: 90 mg/dL (ref 65–99)
Potassium: 4.5 mmol/L (ref 3.5–5.2)
Sodium: 143 mmol/L (ref 134–144)
Total Protein: 6.2 g/dL (ref 6.0–8.5)

## 2020-10-01 LAB — CBC WITH DIFFERENTIAL/PLATELET
Basophils Absolute: 0 10*3/uL (ref 0.0–0.2)
Basos: 0 %
EOS (ABSOLUTE): 0.1 10*3/uL (ref 0.0–0.4)
Eos: 1 %
Hematocrit: 54.9 % — ABNORMAL HIGH (ref 37.5–51.0)
Hemoglobin: 17.1 g/dL (ref 13.0–17.7)
Immature Grans (Abs): 0 10*3/uL (ref 0.0–0.1)
Immature Granulocytes: 0 %
Lymphocytes Absolute: 1.9 10*3/uL (ref 0.7–3.1)
Lymphs: 35 %
MCH: 23 pg — ABNORMAL LOW (ref 26.6–33.0)
MCHC: 31.1 g/dL — ABNORMAL LOW (ref 31.5–35.7)
MCV: 74 fL — ABNORMAL LOW (ref 79–97)
Monocytes Absolute: 0.4 10*3/uL (ref 0.1–0.9)
Monocytes: 8 %
Neutrophils Absolute: 3 10*3/uL (ref 1.4–7.0)
Neutrophils: 56 %
Platelets: 262 10*3/uL (ref 150–450)
RBC: 7.43 x10E6/uL (ref 4.14–5.80)
RDW: 17.2 % — ABNORMAL HIGH (ref 11.6–15.4)
WBC: 5.4 10*3/uL (ref 3.4–10.8)

## 2020-10-01 LAB — LIPID PANEL
Chol/HDL Ratio: 2.4 ratio (ref 0.0–5.0)
Cholesterol, Total: 113 mg/dL (ref 100–199)
HDL: 48 mg/dL
LDL Chol Calc (NIH): 54 mg/dL (ref 0–99)
Triglycerides: 45 mg/dL (ref 0–149)
VLDL Cholesterol Cal: 11 mg/dL (ref 5–40)

## 2020-10-01 LAB — VITAMIN D 25 HYDROXY (VIT D DEFICIENCY, FRACTURES): Vit D, 25-Hydroxy: 16.2 ng/mL — ABNORMAL LOW (ref 30.0–100.0)

## 2020-10-01 LAB — TSH: TSH: 1.68 u[IU]/mL (ref 0.450–4.500)

## 2020-10-01 LAB — VITAMIN B12: Vitamin B-12: 559 pg/mL (ref 232–1245)

## 2020-10-05 ENCOUNTER — Encounter: Payer: Self-pay | Admitting: Family Medicine

## 2020-10-05 ENCOUNTER — Other Ambulatory Visit: Payer: Self-pay | Admitting: Family Medicine

## 2020-10-05 DIAGNOSIS — E559 Vitamin D deficiency, unspecified: Secondary | ICD-10-CM

## 2020-10-05 MED ORDER — VITAMIN D (ERGOCALCIFEROL) 1.25 MG (50000 UNIT) PO CAPS: 50000.0000 [IU] | ORAL_CAPSULE | ORAL | 5 refills | Status: DC

## 2020-10-07 NOTE — Progress Notes (Signed)
Pt was called to discuss his lab results. Pt stated he understood and will keep his f/u appt. 

## 2020-10-11 LAB — CUP PACEART REMOTE DEVICE CHECK
Date Time Interrogation Session: 20211112230413
Implantable Pulse Generator Implant Date: 20200828

## 2020-10-12 ENCOUNTER — Ambulatory Visit (INDEPENDENT_AMBULATORY_CARE_PROVIDER_SITE_OTHER): Payer: Medicaid Other

## 2020-10-12 DIAGNOSIS — I639 Cerebral infarction, unspecified: Secondary | ICD-10-CM

## 2020-10-14 ENCOUNTER — Ambulatory Visit: Payer: Medicaid Other | Admitting: Family Medicine

## 2020-10-14 NOTE — Progress Notes (Signed)
Carelink Summary Report / Loop Recorder 

## 2020-11-11 LAB — CUP PACEART REMOTE DEVICE CHECK
Date Time Interrogation Session: 20211213230204
Implantable Pulse Generator Implant Date: 20200828

## 2020-11-12 ENCOUNTER — Ambulatory Visit (INDEPENDENT_AMBULATORY_CARE_PROVIDER_SITE_OTHER): Payer: Medicaid Other

## 2020-11-12 DIAGNOSIS — I639 Cerebral infarction, unspecified: Secondary | ICD-10-CM | POA: Diagnosis not present

## 2020-11-17 ENCOUNTER — Other Ambulatory Visit: Payer: Self-pay | Admitting: Family Medicine

## 2020-11-17 DIAGNOSIS — E785 Hyperlipidemia, unspecified: Secondary | ICD-10-CM

## 2020-11-26 NOTE — Progress Notes (Signed)
Carelink Summary Report / Loop Recorder 

## 2020-12-01 NOTE — Progress Notes (Signed)
Virtual Visit via Video Note The purpose of this virtual visit is to provide medical care while limiting exposure to the novel coronavirus.    Consent was obtained for video visit:  Yes.   Answered questions that patient had about telehealth interaction:  Yes.   I discussed the limitations, risks, security and privacy concerns of performing an evaluation and management service by telemedicine. I also discussed with the patient that there may be a patient responsible charge related to this service. The patient expressed understanding and agreed to proceed.  Pt location: Home Physician Location: office Name of referring provider:  Kallie Locks, FNP I connected with [name] at patients initiation/request on 12/07/2020 at  8:50 AM EST by video enabled telemedicine application and verified that I am speaking with the correct person using two identifiers. Pt MRN:  [id number] Pt DOB:  Feb 10, 1971 Video Participants:  [name]   History of Present Illness:  Brandon Summers is a 50year oldright-handed black man, smoker,whofollows up for stroke.  UPDATE: Current medications: ASA 81mg  daily; Lipitor 40mg  daily Implantable loop recorder has not identified a fib. He has been approved for disability. LDL from November was 54.   HISTORY: He was admitted to [facility] on 05/21/19 after presenting with blurredvision, headache and right sided numbness. CT of head revealed no acute stroke. MRI of brain, however, showed an acute large left thalamic infarct as well as significant chronic small vessel ischemic changes. CTA of head and neck revealed no aneurysm or emergent large vessel stenosis or occlusion. 2D echo demonstrated EF 55-60% with no cardiac source of embolus. TEE demonstrated no PFO or other cardiac source of embolus. LDL was 114 and Hgb A1c was 5.7. He was discharged onASA 81mg and Plavix 75mg  dailyfor 3 weeks, followed by ASA alone. He was started on Lipitor 40mg   daily. He was advised to stop smoking.   Following discharge, he reportedsome blurred vision. Right hand hurts but all extremities feel "tired". He reports that he is drooling from both sides of his face. He says face feels "different" but he denies numbness and tingling sensation. He says when he talks, he tends to talk slowly because if he talks, he gets a left sided headache. He underwent neuropsychological testing on 05/12/2020 which demonstrated mild vascular neurocognitive disorder with deficits believed to hinder patient's ability to maintain competitive employment.  Past Medical History: Past Medical History:  Diagnosis Date  . Cough   . CVA (cerebral vascular accident) 05/21/2019   Left thalamic stroke  . HLD (hyperlipidemia) 05/23/2019  . 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 Nonintractable headache 04/15/2020  . Pacemaker   . Right sided weakness 09/09/2019  . Shortness of breath   . Visual problems   . Vitamin D deficiency 11/2019    Medications: Outpatient Encounter Medications as of 12/07/2020  Medication Sig Note  . albuterol (VENTOLIN HFA) 108 (90 Base) MCG/ACT inhaler Inhale 2 puffs into the lungs every 6 (six) hours as needed for wheezing or shortness of breath. 04/14/2020: As needed.   04/17/2020 atorvastatin (LIPITOR) 40 MG tablet TAKE 1 TABLET (40 MG TOTAL) BY MOUTH DAILY AT 6 PM.   . busPIRone (BUSPAR) 10 MG tablet Take 1 tablet (10 mg total) by mouth 2 (two) times daily.   11/09/2019 gabapentin (NEURONTIN) 300 MG capsule Take 1 capsule (300 mg total) by mouth 2 (two) times daily.   12/2019 topiramate (TOPAMAX) 50 MG tablet Take 1 tablet (50 mg total) by mouth 2 (two) times  daily.   . triamcinolone cream (KENALOG) 0.1 % Apply 1 application topically 2 (two) times daily.   . Vitamin D, Ergocalciferol, (DRISDOL) 1.25 MG (50000 UNIT) CAPS capsule Take 1 capsule (50,000 Units total) by mouth every 7 (seven) days.    No facility-administered encounter medications on file as  of 12/07/2020.    Allergies: No Known Allergies  Family History: Family History  Problem Relation Age of Onset  . Hypertension Mother   . Stroke Mother     Social History: Social History   Socioeconomic History  . Marital status: Married    Spouse name: Not on file  . Number of children: Not on file  . Years of education: 38  . Highest education level: High school graduate  Occupational History  . Occupation: Chef  Tobacco Use  . Smoking status: Former Smoker    Types: Cigarettes    Quit date: 05/21/2019    Years since quitting: 1.5  . Smokeless tobacco: Never Used  Vaping Use  . Vaping Use: Never used  Substance and Sexual Activity  . Alcohol use: Yes    Comment: occasional  . Drug use: No  . Sexual activity: Not Currently  Other Topics Concern  . Not on file  Social History Narrative   Marital status: separated; moved from [place] to [place] 1998   Children: 2 children; no grandchildren.   Employment: chef   Tobacco: 1/2ppd   Alcohol: none   Drugs: none   Exercise: none.    no Caffeine   Social Determinants of Corporate investment banker Strain: Not on file  Food Insecurity: Not on file  Transportation Needs: Not on file  Physical Activity: Not on file  Stress: Not on file  Social Connections: Not on file  Intimate Partner Violence: Not on file    Observations/Objective:   There were no vitals taken for this visit. No acute distress.  Alert and oriented.  Speech fluent and not dysarthric.  Language intact.  Eyes orthophoric on primary gaze.  Face symmetric.  Assessment and Plan:   1.  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  Hyperlipidemia 5.  Tobacco use disorder.  1.  Secondary stroke prevention as managed by PCP: -  ASA 81mg  daily - Atorvastatin 80mg .  LDL goal less than 70 - Blood pressure control. -  Glycemic control. Hgb A1c goal less than 7 2.  Smoking cessation, Mediterranean diet, routine exercise 3.  Follow up 6 months.   Follow Up Instructions:    -I discussed the assessment and treatment plan with the patient. The patient was provided an opportunity to ask questions and all were answered. The patient agreed with the plan and demonstrated an understanding of the instructions.   The patient was advised to call back or seek an in-person evaluation if the symptoms worsen or if the condition fails to improve as anticipated.    , DO

## 2020-12-07 ENCOUNTER — Encounter: Payer: Self-pay | Admitting: Neurology

## 2020-12-07 ENCOUNTER — Telehealth (INDEPENDENT_AMBULATORY_CARE_PROVIDER_SITE_OTHER): Payer: Medicaid Other | Admitting: Neurology

## 2020-12-07 ENCOUNTER — Other Ambulatory Visit: Payer: Self-pay

## 2020-12-07 DIAGNOSIS — E7849 Other hyperlipidemia: Secondary | ICD-10-CM

## 2020-12-07 DIAGNOSIS — I639 Cerebral infarction, unspecified: Secondary | ICD-10-CM

## 2020-12-07 DIAGNOSIS — F01A0 Vascular dementia, mild, without behavioral disturbance, psychotic disturbance, mood disturbance, and anxiety: Secondary | ICD-10-CM

## 2020-12-07 DIAGNOSIS — I69320 Aphasia following cerebral infarction: Secondary | ICD-10-CM

## 2020-12-07 DIAGNOSIS — I6381 Other cerebral infarction due to occlusion or stenosis of small artery: Secondary | ICD-10-CM

## 2020-12-07 DIAGNOSIS — F0150 Vascular dementia without behavioral disturbance: Secondary | ICD-10-CM | POA: Diagnosis not present

## 2020-12-15 ENCOUNTER — Ambulatory Visit (INDEPENDENT_AMBULATORY_CARE_PROVIDER_SITE_OTHER): Payer: Medicaid Other

## 2020-12-15 DIAGNOSIS — I639 Cerebral infarction, unspecified: Secondary | ICD-10-CM | POA: Diagnosis not present

## 2020-12-15 LAB — CUP PACEART REMOTE DEVICE CHECK
Date Time Interrogation Session: 20220115230401
Implantable Pulse Generator Implant Date: 20200828

## 2020-12-29 NOTE — Progress Notes (Signed)
Carelink Summary Report / Loop Recorder 

## 2020-12-30 ENCOUNTER — Encounter: Payer: Self-pay | Admitting: Family Medicine

## 2020-12-30 ENCOUNTER — Ambulatory Visit (INDEPENDENT_AMBULATORY_CARE_PROVIDER_SITE_OTHER): Payer: Medicaid Other | Admitting: Family Medicine

## 2020-12-30 ENCOUNTER — Other Ambulatory Visit: Payer: Self-pay

## 2020-12-30 VITALS — BP 119/72 | HR 67 | Temp 97.4°F | Ht 72.0 in | Wt 189.0 lb

## 2020-12-30 DIAGNOSIS — F411 Generalized anxiety disorder: Secondary | ICD-10-CM | POA: Diagnosis not present

## 2020-12-30 DIAGNOSIS — M79604 Pain in right leg: Secondary | ICD-10-CM

## 2020-12-30 DIAGNOSIS — Z1212 Encounter for screening for malignant neoplasm of rectum: Secondary | ICD-10-CM | POA: Diagnosis not present

## 2020-12-30 DIAGNOSIS — R519 Headache, unspecified: Secondary | ICD-10-CM

## 2020-12-30 DIAGNOSIS — R531 Weakness: Secondary | ICD-10-CM

## 2020-12-30 DIAGNOSIS — Z8673 Personal history of transient ischemic attack (TIA), and cerebral infarction without residual deficits: Secondary | ICD-10-CM | POA: Diagnosis not present

## 2020-12-30 DIAGNOSIS — K5909 Other constipation: Secondary | ICD-10-CM | POA: Diagnosis not present

## 2020-12-30 DIAGNOSIS — L299 Pruritus, unspecified: Secondary | ICD-10-CM

## 2020-12-30 DIAGNOSIS — Z1211 Encounter for screening for malignant neoplasm of colon: Secondary | ICD-10-CM | POA: Diagnosis not present

## 2020-12-30 DIAGNOSIS — Z09 Encounter for follow-up examination after completed treatment for conditions other than malignant neoplasm: Secondary | ICD-10-CM | POA: Diagnosis not present

## 2020-12-30 DIAGNOSIS — R21 Rash and other nonspecific skin eruption: Secondary | ICD-10-CM

## 2020-12-30 NOTE — Progress Notes (Signed)
[facility]    Established Patient Office Visit  Subjective:  Patient ID: Brandon Summers, male    DOB: 07-24-1971  Age: 50 y.o. MRN: [id number]  CC:  Chief Complaint  Patient presents with  . Follow-up    Follow up , pt has no concerns     HPI Brandon Summers is a 50 year old male who presents for Follow Up today.   Patient Active Problem List   Diagnosis Date Noted  . 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 Nonintractable headache 04/15/2020  . Pain of right lower extremity 04/15/2020  . Tobacco use 09/09/2019  . Cough 09/09/2019  . Shortness of breath 09/09/2019  . Right sided weakness 09/09/2019  . Abnormal PFT 08/12/2019  . HLD (hyperlipidemia) 05/23/2019  . CVA (cerebral vascular accident) (HCC) 05/21/2019   Current Status: Since his last office visit, he is doing well with no complaints. He has chronic constipation, which he takes as OTC stool softener. He is requesting referral for Colonoscopy. He denies fevers, chills, fatigue, recent infections, weight loss, and night sweats. He has not had any headaches, visual changes, dizziness, and falls. No chest pain, heart palpitations, cough and shortness of breath reported. Denies GI problems such as nausea, vomiting, diarrhea, and constipation. He has no reports of blood in stools, dysuria and hematuria. No depression or anxiety, and denies suicidal ideations, homicidal ideations, or auditory hallucinations. He is taking all medications as prescribed. He denies pain today.    Past Medical History:  Diagnosis Date  . Cough   . CVA (cerebral vascular accident) 05/21/2019   Left thalamic stroke  . HLD (hyperlipidemia) 05/23/2019  . 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 Nonintractable headache 04/15/2020  . Pacemaker   . Right sided weakness 09/09/2019  . Shortness of breath   . Visual problems   . Vitamin D deficiency 11/2019    Past Surgical History:   Procedure Laterality Date  . BUBBLE STUDY  05/23/2019   Procedure: BUBBLE STUDY;  Surgeon: Chrystie Nose, MD;  Location: [facility] ENDOSCOPY;  Service: Cardiovascular;;  . implantable loop recorder placement  07/26/2019   MDT Reveal [id number] implanted by Dr Johney Frame for cryptogenic stroke  . TEE WITHOUT CARDIOVERSION N/A 05/23/2019   Procedure: TRANSESOPHAGEAL ECHOCARDIOGRAM (TEE);  Surgeon: Chrystie Nose, MD;  Location: [facility] ENDOSCOPY;  Service: Cardiovascular;  Laterality: N/A;    Family History  Problem Relation Age of Onset  . Hypertension Mother   . Stroke Mother     Social History   Socioeconomic History  . Marital status: Married    Spouse name: Not on file  . Number of children: Not on file  . Years of education: 55  . Highest education level: High school graduate  Occupational History  . Occupation: Chef  Tobacco Use  . Smoking status: Former Smoker    Types: Cigarettes    Quit date: 05/21/2019    Years since quitting: 1.6  . Smokeless tobacco: Never Used  Vaping Use  . Vaping Use: Never used  Substance and Sexual Activity  . Alcohol use: Yes    Comment: occasional  . Drug use: No  . Sexual activity: Not Currently  Other Topics Concern  . Not on file  Social History Narrative   Marital status: separated; moved from [place] to [place] 1998   Children: 2 children; no grandchildren.   Employment: chef   Tobacco: 1/2ppd   Alcohol: none   Drugs: none  Exercise: none.    no Caffeine   Social Determinants of Corporate investment banker Strain: Not on file  Food Insecurity: Not on file  Transportation Needs: Not on file  Physical Activity: Not on file  Stress: Not on file  Social Connections: Not on file  Intimate Partner Violence: Not on file    Outpatient Medications Prior to Visit  Medication Sig Dispense Refill  . albuterol (VENTOLIN HFA) 108 (90 Base) MCG/ACT inhaler Inhale 2 puffs into the lungs every 6 (six) hours as needed for wheezing or shortness of  breath. 8 g 11  . atorvastatin (LIPITOR) 40 MG tablet TAKE 1 TABLET (40 MG TOTAL) BY MOUTH DAILY AT 6 PM. 90 tablet 0  . busPIRone (BUSPAR) 10 MG tablet Take 1 tablet (10 mg total) by mouth 2 (two) times daily. 60 tablet 3  . gabapentin (NEURONTIN) 300 MG capsule Take 1 capsule (300 mg total) by mouth 2 (two) times daily. 60 capsule 6  . topiramate (TOPAMAX) 50 MG tablet Take 1 tablet (50 mg total) by mouth 2 (two) times daily. 60 tablet 6  . triamcinolone cream (KENALOG) 0.1 % Apply 1 application topically 2 (two) times daily. 30 g 3  . Vitamin D, Ergocalciferol, (DRISDOL) 1.25 MG (50000 UNIT) CAPS capsule Take 1 capsule (50,000 Units total) by mouth every 7 (seven) days. 5 capsule 5   No facility-administered medications prior to visit.    No Known Allergies  ROS Review of Systems  Constitutional: Negative.   HENT: Negative.   Eyes: Negative.   Respiratory: Negative.   Cardiovascular: Negative.   Gastrointestinal: Negative.   Endocrine: Negative.   Genitourinary: Negative.   Musculoskeletal: Positive for arthralgias (lower extremity pain).  Skin: Positive for rash.  Allergic/Immunologic: Negative.   Neurological: Positive for dizziness (occasional ), weakness (lower extremity) and headaches (occasional ).  Hematological: Negative.   Psychiatric/Behavioral: Negative.       Objective:    Physical Exam Vitals and nursing note reviewed.  Constitutional:      Appearance: Normal appearance.  HENT:     Head: Normocephalic and atraumatic.     Nose: Nose normal.     Mouth/Throat:     Mouth: Mucous membranes are moist.     Pharynx: Oropharynx is clear.  Cardiovascular:     Rate and Rhythm: Normal rate and regular rhythm.     Pulses: Normal pulses.     Heart sounds: Normal heart sounds.  Pulmonary:     Effort: Pulmonary effort is normal.     Breath sounds: Normal breath sounds.  Abdominal:     General: Bowel sounds are normal.     Palpations: Abdomen is soft.   Musculoskeletal:        General: Normal range of motion.     Cervical back: Normal range of motion and neck supple.  Skin:    Findings: Rash present.  Neurological:     Mental Status: He is alert and oriented to person, place, and time.     Comments: Lower extremity weakness.   Psychiatric:        Mood and Affect: Mood normal.        Behavior: Behavior normal.        Thought Content: Thought content normal.        Judgment: Judgment normal.     BP 119/72 (BP Location: Left Arm, Patient Position: Sitting, Cuff Size: Large)   Pulse 67   Temp (!) 97.4 F (36.3 C) (Temporal)   Ht  6' (1.829 m)   Wt 189 lb (85.7 kg)   SpO2 99%   BMI 25.63 kg/m  Wt Readings from Last 3 Encounters:  12/30/20 189 lb (85.7 kg)  09/30/20 181 lb 9.6 oz (82.4 kg)  06/04/20 181 lb 9.6 oz (82.4 kg)     Health Maintenance Due  Topic Date Due  . Hepatitis C Screening  Never done  . COVID-19 Vaccine (1) Never done  . HIV Screening  Never done  . COLONOSCOPY (Pts 45-95yrs Insurance coverage will need to be confirmed)  Never done    There are no preventive care reminders to display for this patient.  Lab Results  Component Value Date   TSH 1.680 09/30/2020   Lab Results  Component Value Date   WBC 5.4 09/30/2020   HGB 17.1 09/30/2020   HCT 54.9 (H) 09/30/2020   MCV 74 (L) 09/30/2020   PLT 262 09/30/2020   Lab Results  Component Value Date   NA 143 09/30/2020   K 4.5 09/30/2020   CO2 26 09/30/2020   GLUCOSE 90 09/30/2020   BUN 8 09/30/2020   CREATININE 0.98 09/30/2020   BILITOT 0.4 09/30/2020   ALKPHOS 64 09/30/2020   AST 28 09/30/2020   ALT 64 (H) 09/30/2020   PROT 6.2 09/30/2020   ALBUMIN 4.1 09/30/2020   CALCIUM 9.2 09/30/2020   ANIONGAP 7 10/13/2019   Lab Results  Component Value Date   CHOL 113 09/30/2020   Lab Results  Component Value Date   HDL 48 09/30/2020   Lab Results  Component Value Date   LDLCALC 54 09/30/2020   Lab Results  Component Value Date   TRIG  45 09/30/2020   Lab Results  Component Value Date   CHOLHDL 2.4 09/30/2020   Lab Results  Component Value Date   HGBA1C 5.7 (H) 05/22/2019      Assessment & Plan:   1. History of CVA (cerebrovascular accident) Stable. No signs or symptoms of recurrence noted or reported today. Monitor.   2. Nonintractable headache, unspecified chronicity pattern, unspecified headache type Stable.   3. Generalized anxiety disorder  4. Right sided weakness He will change positions slowly when ambulating.   5. Pain of right lower extremity  6. Generalized weakness  7. Rash  8. Itching  9. Chronic constipation - Ambulatory referral to Gastroenterology  10. Screening for colorectal cancer - Ambulatory referral to Gastroenterology  11. Follow up He will follow up in 6 months.  No orders of the defined types were placed in this encounter.   Orders Placed This Encounter  Procedures  . Ambulatory referral to Gastroenterology     Referral Orders     Ambulatory referral to Gastroenterology   Raliegh Ip, MSN, ANE, FNP-BC [facility] [address] [place], [place] [address] [phone]   Problem List Items Addressed This Visit      Nervous and Auditory   Right sided weakness     Other   Nonintractable headache   Pain of right lower extremity    Other Visit Diagnoses    History of CVA (cerebrovascular accident)    -  Primary   Generalized anxiety disorder       Generalized weakness       Rash       Itching       Chronic constipation       Relevant Orders   Ambulatory referral to Gastroenterology  Screening for colorectal cancer       Follow up          No orders of the defined types were placed in this encounter.   Follow-up: No follow-ups on file.    Kallie Locks, FNP

## 2021-01-18 ENCOUNTER — Ambulatory Visit (INDEPENDENT_AMBULATORY_CARE_PROVIDER_SITE_OTHER): Payer: Medicaid Other

## 2021-01-18 DIAGNOSIS — I639 Cerebral infarction, unspecified: Secondary | ICD-10-CM

## 2021-01-19 LAB — CUP PACEART REMOTE DEVICE CHECK
Date Time Interrogation Session: 20220217230719
Implantable Pulse Generator Implant Date: 20200828

## 2021-01-22 NOTE — Progress Notes (Signed)
Carelink Summary Report / Loop Recorder 

## 2021-02-04 ENCOUNTER — Ambulatory Visit (INDEPENDENT_AMBULATORY_CARE_PROVIDER_SITE_OTHER): Payer: Medicaid Other | Admitting: Gastroenterology

## 2021-02-04 ENCOUNTER — Encounter: Payer: Self-pay | Admitting: Gastroenterology

## 2021-02-04 VITALS — BP 122/78 | HR 90 | Ht 72.0 in | Wt 194.5 lb

## 2021-02-04 DIAGNOSIS — K5909 Other constipation: Secondary | ICD-10-CM

## 2021-02-04 NOTE — Patient Instructions (Addendum)
It was a pleasure to meet you today. Based on our discussion, I am providing you with my recommendations below:  RECOMMENDATION(S):   I am not making any changes to your medications. Please continue all medications as ordered.  COLONOSCOPY:   . You have been scheduled for a colonoscopy. Please follow written instructions given to you at your visit today.   PREP:   . Please pick up your prep supplies at the pharmacy within the next 1-3 days.  INHALERS:   . If you use inhalers (even only as needed), please bring them with you on the day of your procedure.  COLONOSCOPY TIPS:  . To reduce nausea and dehydration, stay well hydrated for 3-4 days prior to the exam.  . To prevent skin/hemorrhoid irritation - prior to wiping, put A&Dointment or vaseline on the toilet paper. Marland Kitchen Keep a towel or pad on the bed.  Marland Kitchen BEFORE STARTING YOUR PREP, drink  64oz of clear liquids in the morning. This will help to flush the colon and will ensure you are well hydrated!!!!  NOTE - This is in addition to the fluids required for to complete your prep. . Use of a flavored hard candy, such as grape Rubin Payor, can counteract some of the flavor of the prep and may prevent some nausea.   BMI:  . If you are age 49 or younger, your body mass index should be between 19-25. Your There is no height or weight on file to calculate BMI. If this is out of the aformentioned range listed, please consider follow up with your Primary Care Provider.   Thank you for trusting me with your gastrointestinal care!    Tressia Danas, MD, MPH

## 2021-02-04 NOTE — Progress Notes (Signed)
Referring Provider: Kallie Locks, FNP Primary Care Physician:  Kallie Locks, FNP  Reason for Consultation:  Constipation   IMPRESSION:  Chronic constipation - now improved with dietary changes    - normal TSH and calcium    - previously managed with PRN OTC stool softeners No prior colon cancer screening    - discussed screening strategies    - he would like to proceed with colonoscopy No known family history of colon cancer or polyps  PLAN: Continue current management regimen Consider addition of psyllium or methylcellulose if needed in the future Screening colonoscopy with 2 day bowel prep  Please see the "Patient Instructions" section for addition details about the plan.  HPI: Brandon Summers is a 50 y.o. male referred by Dr. Bradly Chris for constipation. He has hyperlipidemia, chronic headaches, anxiety, and had a CVA 05/2019. His daughter accompanies him to this appointment. He is from [place] and has been in the [place] since 1998.   Reports chronic constipation for 2-3 years. Has adjusted his diet as recommended by NP Bradly Chris to improve his constipation. He is very satisfied with the response. He is now having a bowel movement 1-3 times a day. Rare straining. Since of complete evacuation. No associated abdominal pain. No rectal pain. No blood or mucous in the stool. Uses OTC stool softener. Requesting colonoscopy, although he is not sure it's needed given his symptomatic improvement. .   TSH and calcium normal 09/30/20 No prior abdominal imaging No prior endoscopic evaluation  No known family history of colon cancer or polyps. No family history of uterine/endometrial cancer, pancreatic cancer or gastric/stomach cancer.   Past Medical History:  Diagnosis Date  . Cough   . CVA (cerebral vascular accident) 05/21/2019   Left thalamic stroke  . HLD (hyperlipidemia) 05/23/2019  . 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 Nonintractable headache 04/15/2020  . Pacemaker    . Right sided weakness 09/09/2019  . Shortness of breath   . Visual problems   . Vitamin D deficiency 11/2019    Past Surgical History:  Procedure Laterality Date  . BUBBLE STUDY  05/23/2019   Procedure: BUBBLE STUDY;  Surgeon: Chrystie Nose, MD;  Location: [facility] ENDOSCOPY;  Service: Cardiovascular;;  . implantable loop recorder placement  07/26/2019   MDT Reveal [id number] implanted by Dr Johney Frame for cryptogenic stroke  . TEE WITHOUT CARDIOVERSION N/A 05/23/2019   Procedure: TRANSESOPHAGEAL ECHOCARDIOGRAM (TEE);  Surgeon: Chrystie Nose, MD;  Location: [facility] ENDOSCOPY;  Service: Cardiovascular;  Laterality: N/A;    Current Outpatient Medications  Medication Sig Dispense Refill  . albuterol (VENTOLIN HFA) 108 (90 Base) MCG/ACT inhaler Inhale 2 puffs into the lungs every 6 (six) hours as needed for wheezing or shortness of breath. 8 g 11  . aspirin EC 81 MG tablet Take 81 mg by mouth daily. Swallow whole.    Marland Kitchen atorvastatin (LIPITOR) 40 MG tablet TAKE 1 TABLET (40 MG TOTAL) BY MOUTH DAILY AT 6 PM. 90 tablet 0  . busPIRone (BUSPAR) 10 MG tablet Take 10 mg by mouth daily.    . Cholecalciferol (VITAMIN D) 50 MCG (2000 UT) CAPS Take 1 capsule by mouth daily.    Marland Kitchen gabapentin (NEURONTIN) 300 MG capsule Take 1 capsule (300 mg total) by mouth 2 (two) times daily. 60 capsule 6  . topiramate (TOPAMAX) 50 MG tablet Take 1 tablet (50 mg total) by mouth 2 (two) times daily. 60 tablet 6   No current facility-administered medications for this visit.  Allergies as of 02/04/2021  . (No Known Allergies)    Family History  Problem Relation Age of Onset  . Hypertension Mother   . Stroke Mother   . Colon cancer Neg Hx   . Esophageal cancer Neg Hx   . Pancreatic cancer Neg Hx   . Stomach cancer Neg Hx      Review of Systems: 12 system ROS is negative except as noted above.   Physical Exam: General:   Alert,  well-nourished, pleasant and cooperative in NAD Head:  Normocephalic and  atraumatic. Eyes:  Sclera clear, no icterus.   Conjunctiva pink. Ears:  Normal auditory acuity. Nose:  No deformity, discharge,  or lesions. Mouth:  No deformity or lesions.   Neck:  Supple; no masses or thyromegaly. Lungs:  Clear throughout to auscultation.   No wheezes. Heart:  Regular rate and rhythm; no murmurs. Abdomen:  Soft, thin, nontender, nondistended, normal bowel sounds, no rebound or guarding. No hepatosplenomegaly.   Rectal:  Deferred  Msk:  Symmetrical. No boney deformities LAD: No inguinal or umbilical LAD Extremities:  No clubbing or edema. Neurologic:  Alert and  oriented x4;  grossly nonfocal Skin:  Intact without significant lesions or rashes. Psych:  Alert and cooperative. Normal mood and affect.   Kimberly L. Orvan Falconer, MD, MPH 02/04/2021, 2:30 PM

## 2021-02-15 ENCOUNTER — Other Ambulatory Visit: Payer: Self-pay | Admitting: Family Medicine

## 2021-02-15 DIAGNOSIS — E785 Hyperlipidemia, unspecified: Secondary | ICD-10-CM

## 2021-02-21 LAB — CUP PACEART REMOTE DEVICE CHECK
Date Time Interrogation Session: 20220322230300
Implantable Pulse Generator Implant Date: 20200828

## 2021-02-22 ENCOUNTER — Ambulatory Visit (INDEPENDENT_AMBULATORY_CARE_PROVIDER_SITE_OTHER): Payer: Medicaid Other

## 2021-02-22 DIAGNOSIS — I639 Cerebral infarction, unspecified: Secondary | ICD-10-CM | POA: Diagnosis not present

## 2021-03-08 NOTE — Progress Notes (Signed)
Carelink Summary Report / Loop Recorder 

## 2021-03-17 ENCOUNTER — Encounter: Payer: Medicaid Other | Admitting: Gastroenterology

## 2021-03-22 ENCOUNTER — Ambulatory Visit (INDEPENDENT_AMBULATORY_CARE_PROVIDER_SITE_OTHER): Payer: Medicaid Other

## 2021-03-22 DIAGNOSIS — I639 Cerebral infarction, unspecified: Secondary | ICD-10-CM

## 2021-03-23 LAB — CUP PACEART REMOTE DEVICE CHECK
Date Time Interrogation Session: 20220424230522
Implantable Pulse Generator Implant Date: 20200828

## 2021-04-08 NOTE — Progress Notes (Signed)
Carelink Summary Report / Loop Recorder 

## 2021-04-13 NOTE — Addendum Note (Signed)
Addended by: Geralyn Flash D on: 04/13/2021 03:32 PM   Modules accepted: Level of Service

## 2021-04-26 LAB — CUP PACEART REMOTE DEVICE CHECK
Date Time Interrogation Session: 20220527230307
Implantable Pulse Generator Implant Date: 20200828

## 2021-04-27 ENCOUNTER — Ambulatory Visit (INDEPENDENT_AMBULATORY_CARE_PROVIDER_SITE_OTHER): Payer: Medicaid Other

## 2021-04-27 DIAGNOSIS — I639 Cerebral infarction, unspecified: Secondary | ICD-10-CM | POA: Diagnosis not present

## 2021-05-14 ENCOUNTER — Telehealth: Payer: Self-pay | Admitting: Nurse Practitioner

## 2021-05-14 NOTE — Telephone Encounter (Signed)
Medicine Refill Gabapentin 300mg  Lipitor 40mg  [facility] [phone]

## 2021-05-18 ENCOUNTER — Other Ambulatory Visit: Payer: Self-pay | Admitting: Nurse Practitioner

## 2021-05-18 DIAGNOSIS — E785 Hyperlipidemia, unspecified: Secondary | ICD-10-CM

## 2021-05-18 MED ORDER — ATORVASTATIN CALCIUM 40 MG PO TABS: ORAL_TABLET | ORAL | 0 refills | Status: DC

## 2021-05-20 NOTE — Progress Notes (Signed)
Carelink Summary Report / Loop Recorder 

## 2021-05-21 ENCOUNTER — Telehealth: Payer: Self-pay

## 2021-05-21 NOTE — Telephone Encounter (Signed)
Gabapentin

## 2021-05-24 ENCOUNTER — Other Ambulatory Visit: Payer: Self-pay | Admitting: Nurse Practitioner

## 2021-05-24 DIAGNOSIS — R531 Weakness: Secondary | ICD-10-CM

## 2021-05-24 DIAGNOSIS — M79604 Pain in right leg: Secondary | ICD-10-CM

## 2021-05-24 MED ORDER — GABAPENTIN 300 MG PO CAPS: 300.0000 mg | ORAL_CAPSULE | Freq: Two times a day (BID) | ORAL | 6 refills | Status: DC

## 2021-05-24 NOTE — Telephone Encounter (Signed)
Refill sent.

## 2021-06-01 ENCOUNTER — Ambulatory Visit (INDEPENDENT_AMBULATORY_CARE_PROVIDER_SITE_OTHER): Payer: Medicaid Other

## 2021-06-01 DIAGNOSIS — I639 Cerebral infarction, unspecified: Secondary | ICD-10-CM | POA: Diagnosis not present

## 2021-06-03 LAB — CUP PACEART REMOTE DEVICE CHECK
Date Time Interrogation Session: 20220629230624
Implantable Pulse Generator Implant Date: 20200828

## 2021-06-06 IMAGING — CR CHEST - 2 VIEW
2 series · 2 of 2 positions shown · non-contrast
Comparison: Chest radiograph dated 02/13/2010

CLINICAL DATA: 48-year-old male with altered mental status.

EXAM:
CHEST - 2 VIEW

[w chest lat]
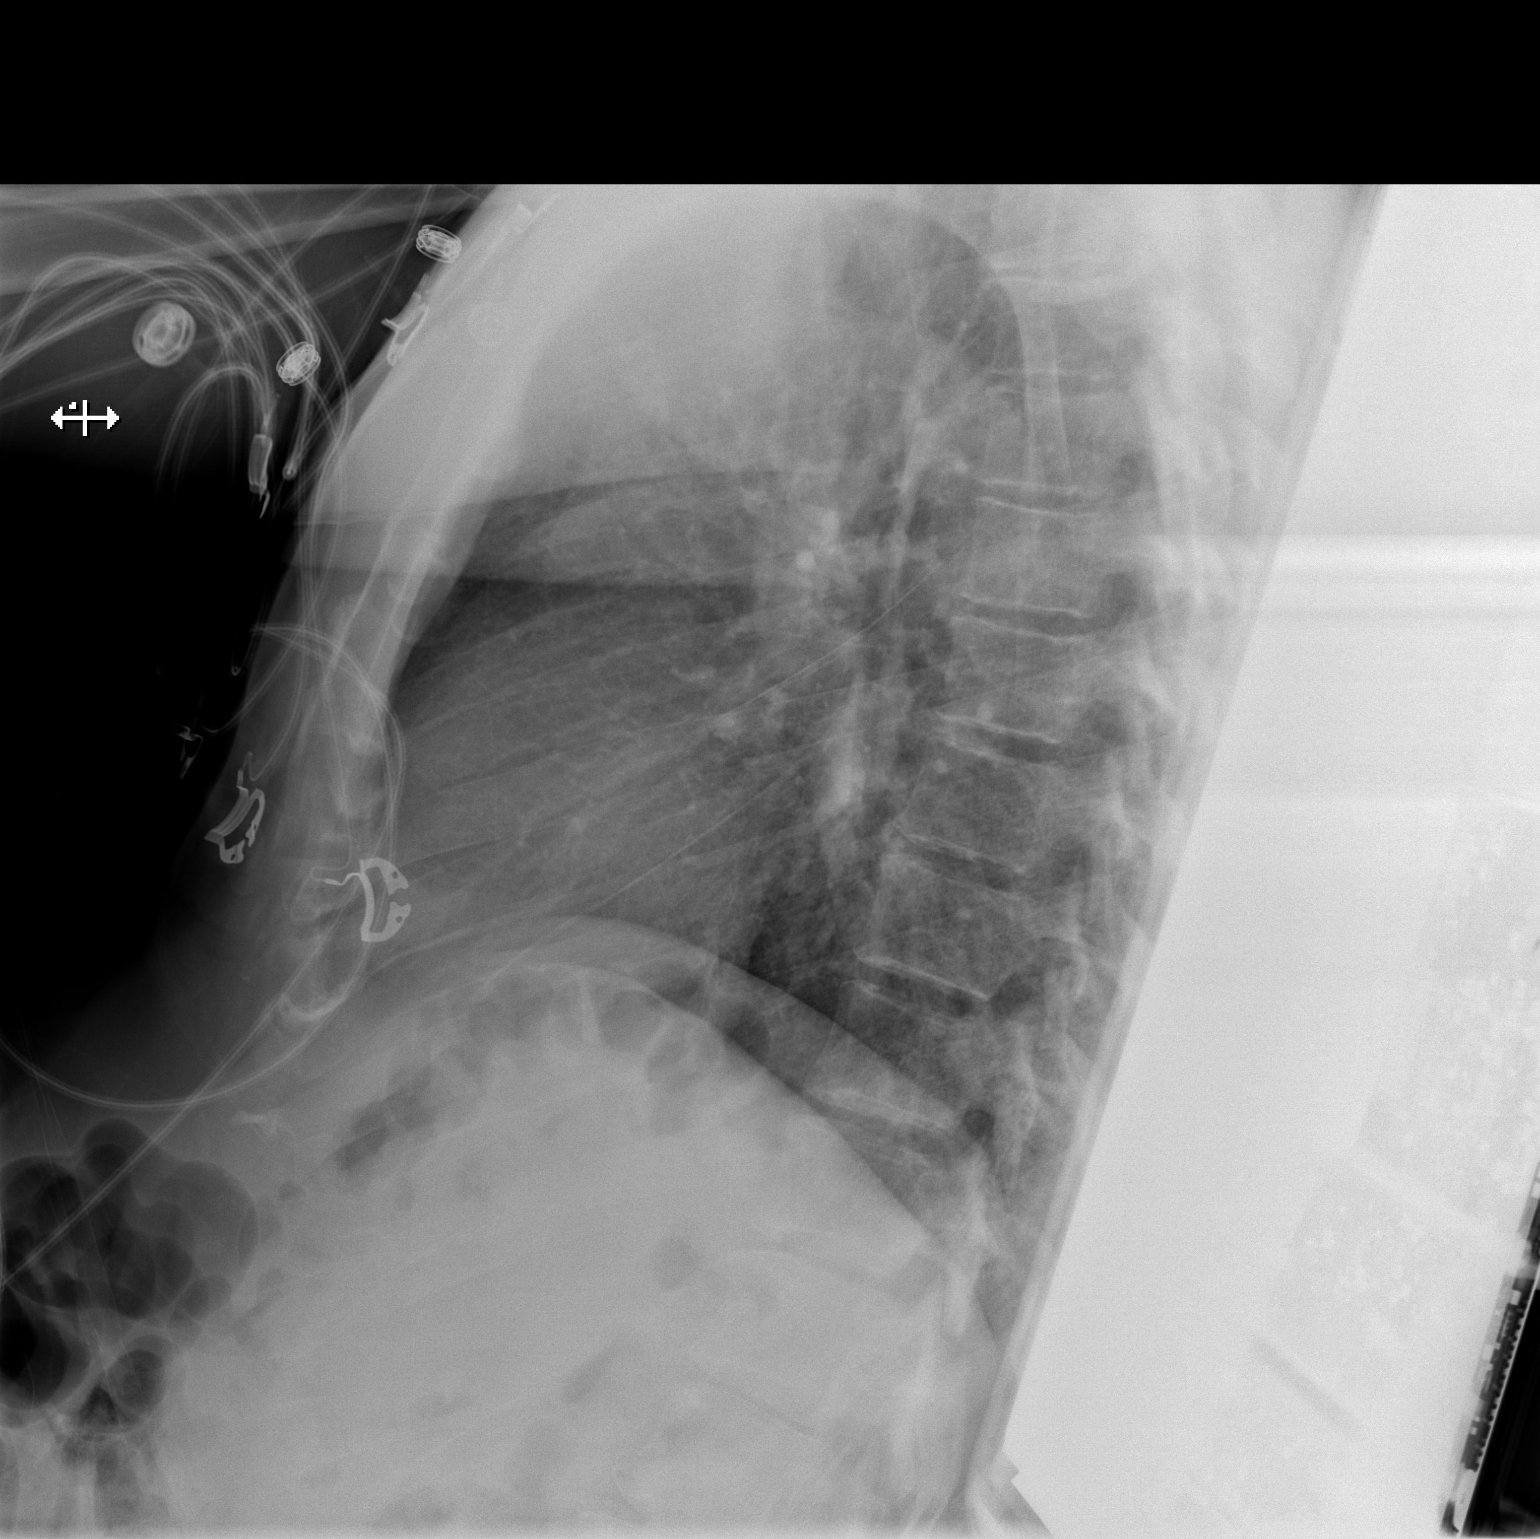

[x chest ap]
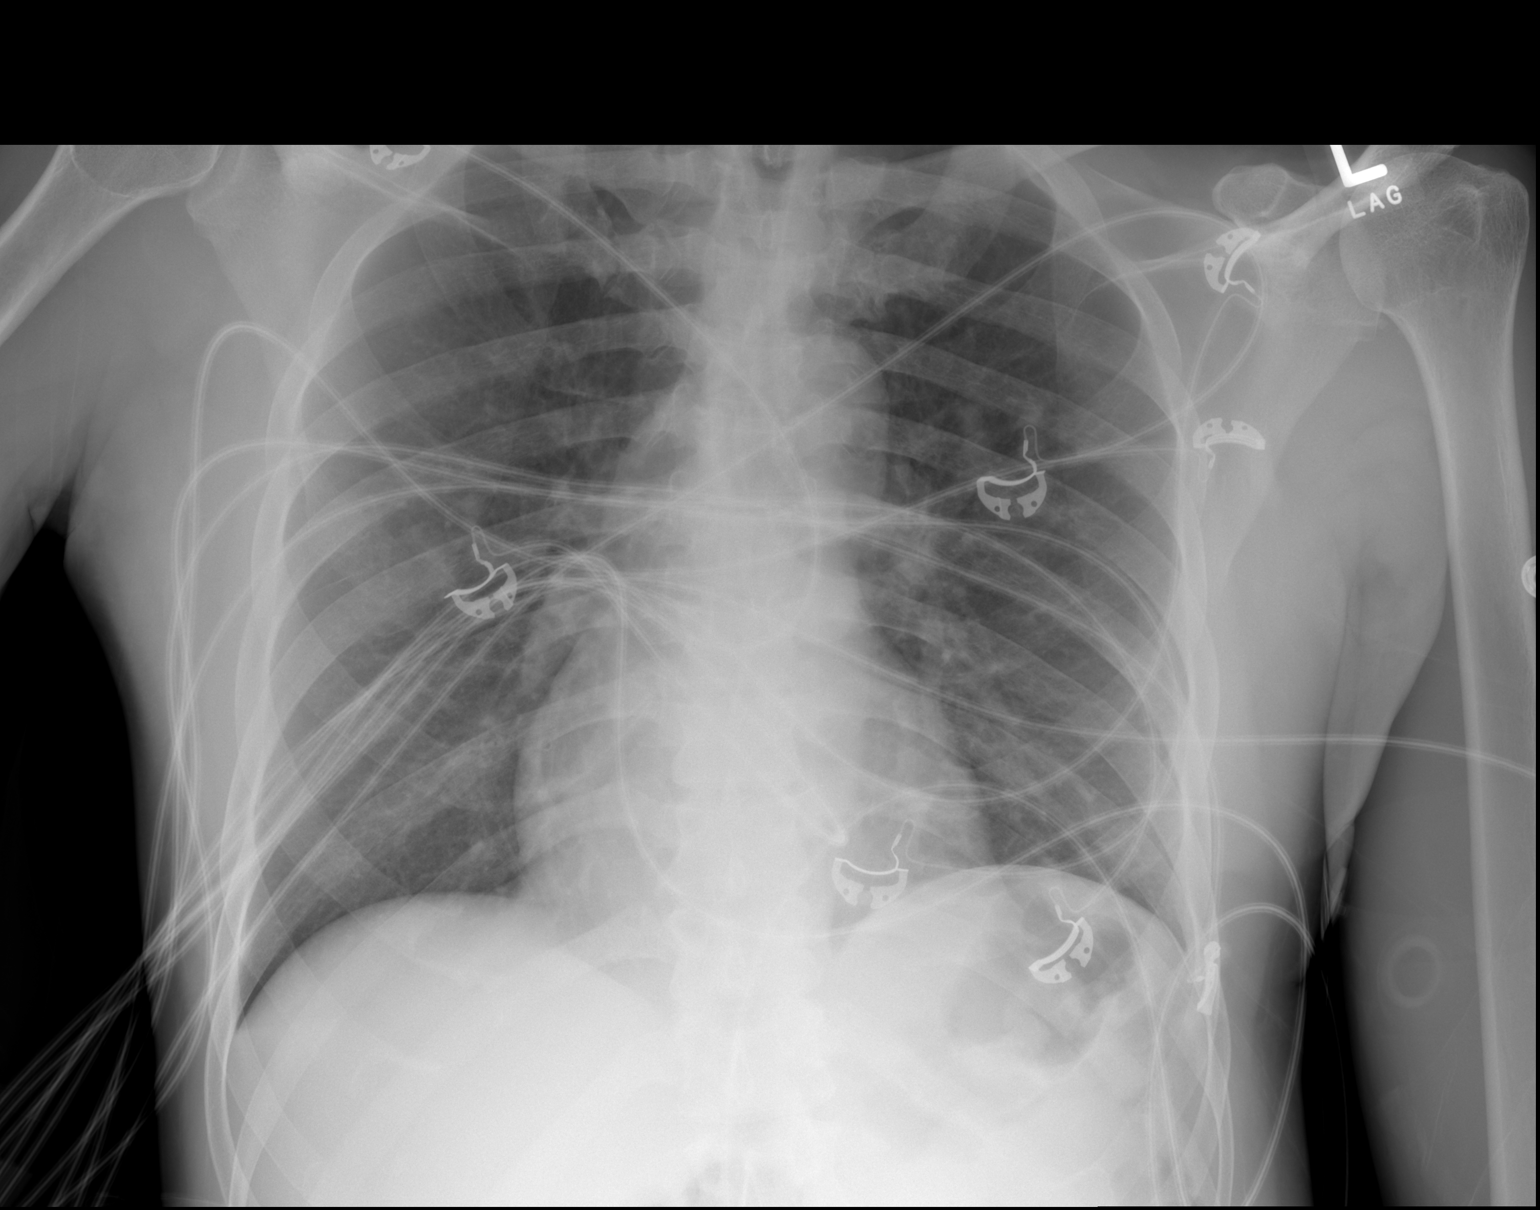

[2 of 2 positions shown; findings below may reference images not displayed]

FINDINGS: The heart size and mediastinal contours are within normal limits.
Both lungs are clear. The visualized skeletal structures are
unremarkable.
IMPRESSION: No active cardiopulmonary disease.

## 2021-06-06 IMAGING — CT CT ANGIOGRAPHY NECK
2 of 9 series · 8 of 46 positions shown, 13 images · IV contrast (OMNI)
Comparison: Brain MRI from yesterday

CLINICAL DATA: Stroke follow-up.  Headache today

EXAM:
CT ANGIOGRAPHY HEAD AND NECK
TECHNIQUE: Multidetector CT imaging of the head and neck was performed using
the standard protocol during bolus administration of intravenous
contrast. Multiplanar CT image reconstructions and MIPs were
obtained to evaluate the vascular anatomy. Carotid stenosis
measurements (when applicable) are obtained utilizing NASCET
criteria, using the distal internal carotid diameter as the
denominator.
CONTRAST:  75mL OMNIPAQUE IOHEXOL 350 MG/ML SOLN

[Series 5: carotid/brain 2.0 i30f 3 · axial · 0.43mm/px · z∈[-277,+3]mm · 6 of 196 slices shown, 11 images]
[im 28/196  soft-tissue]
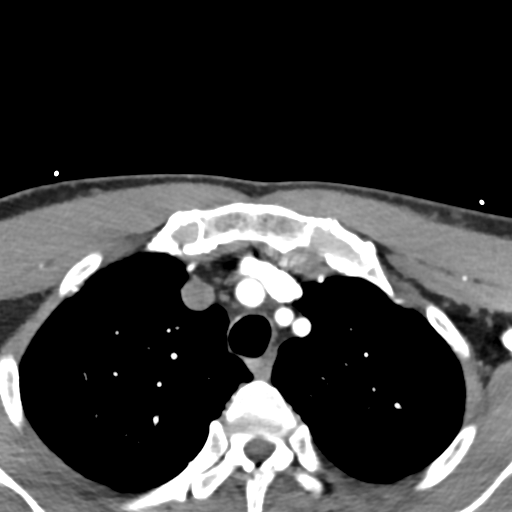
[im 28/196  bone]
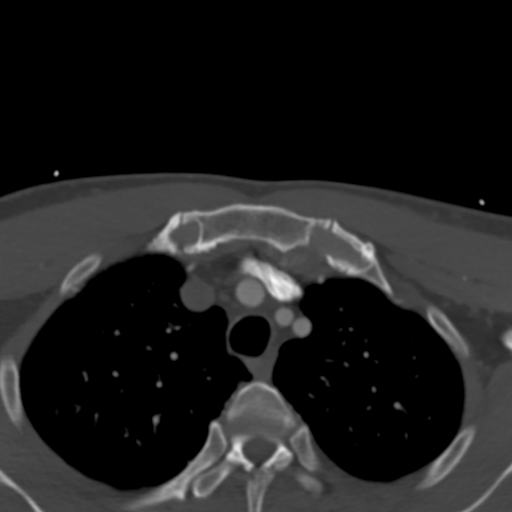
[im 56/196  soft-tissue]
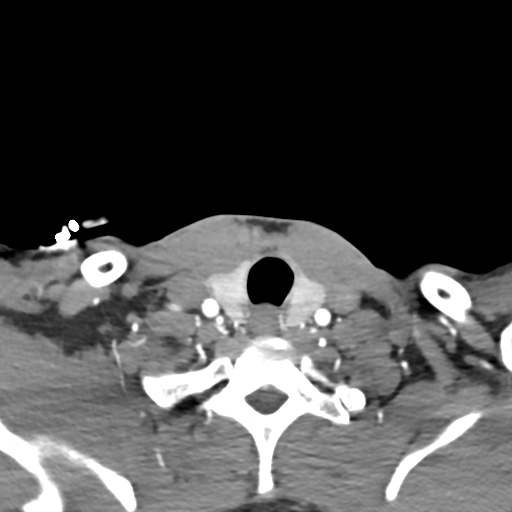
[im 84/196  soft-tissue]
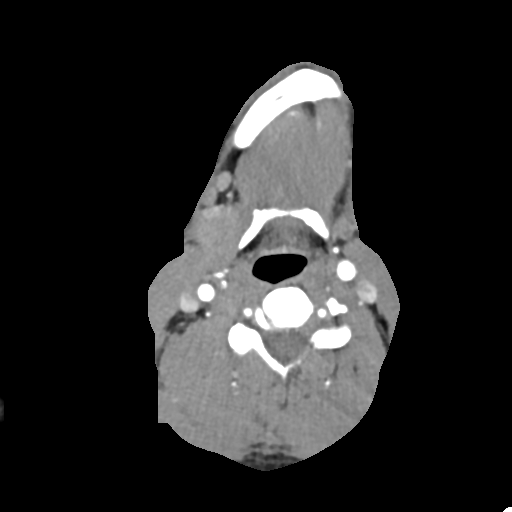
[im 84/196  lung]
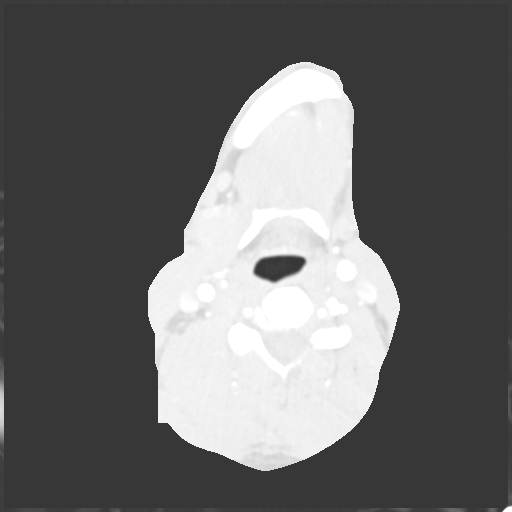
[im 112/196  soft-tissue]
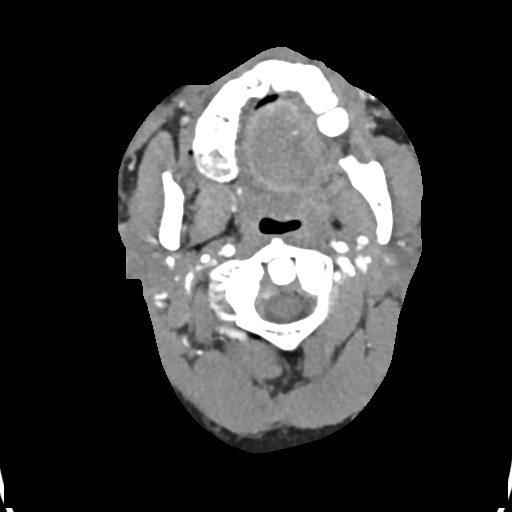
[im 112/196  lung]
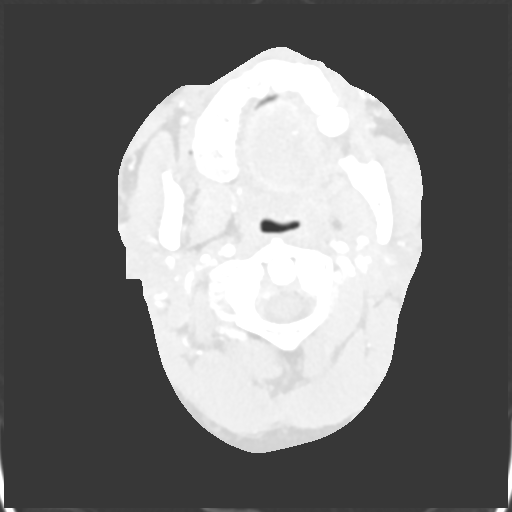
[im 140/196  soft-tissue]
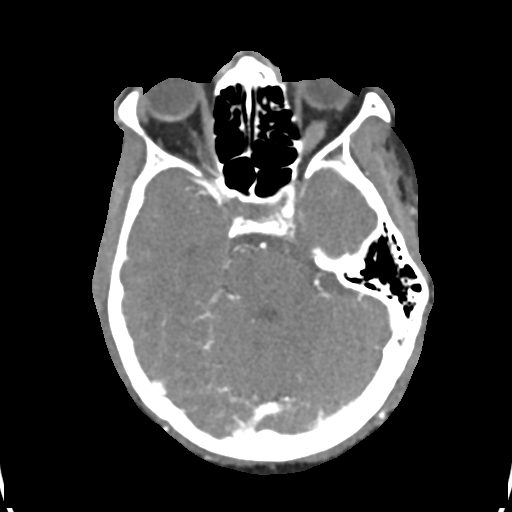
[im 140/196  lung]
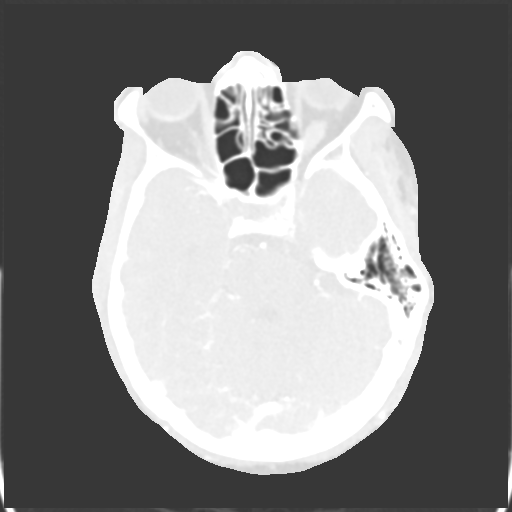
[im 168/196  soft-tissue]
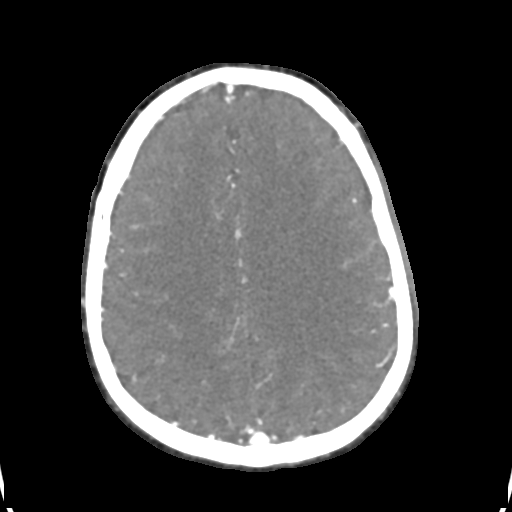
[im 168/196  lung]
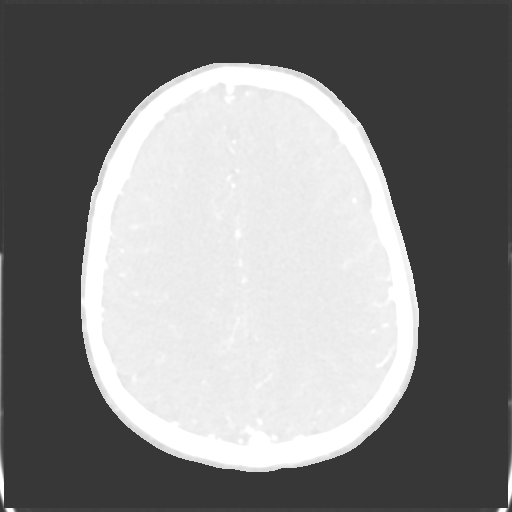

[Series 12: carotid mips (id) · axial · 0.49mm/px · z∈[-200,-70]mm · 2 of 79 slices shown]
[im 27/79  soft-tissue]
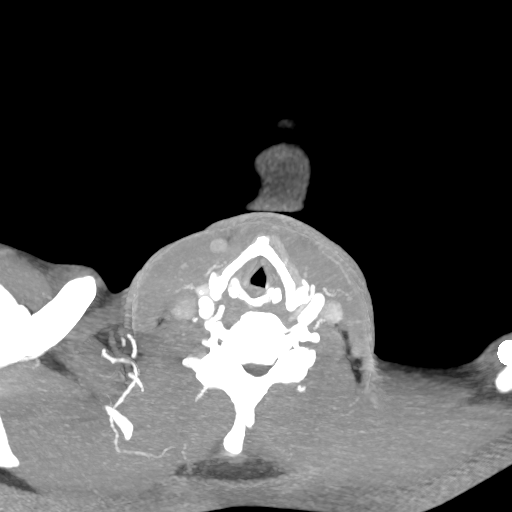
[im 53/79  soft-tissue]
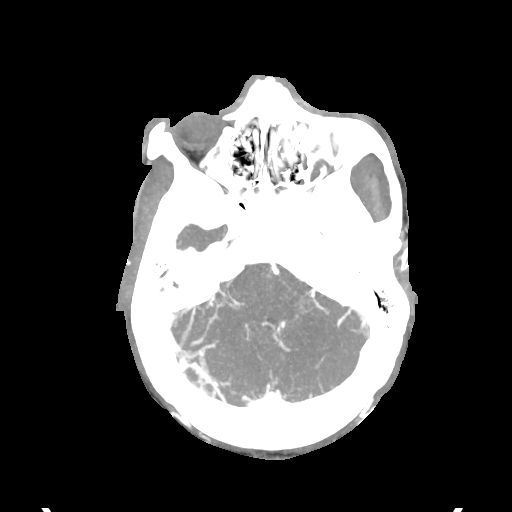

[8 of 46 positions shown; findings below may reference images not displayed]

FINDINGS: CTA NECK FINDINGS

Aortic arch: Normal.

Right carotid system: Vessels are smooth and widely patent. No
atheromatous changes.

Left carotid system: Vessels are smooth and widely patent. No
atheromatous changes.

Vertebral arteries: No proximal subclavian or vertebral stenosis or
beading.

Skeleton: No acute or aggressive finding

Other neck: Negative

Upper chest: Mild centrilobular emphysema. Presumed mucus in the
trachea.

Review of the MIP images confirms the above findings

CTA HEAD FINDINGS

Anterior circulation: Negative for branch occlusion, beading,
stenosis, or calcified plaque. Negative for aneurysm

Posterior circulation: Vertebral and basilar arteries are smooth and
widely patent. These vessels are relatively small in the setting of
bilateral P1 hypoplasia. Negative for branch occlusion or flow
limiting stenosis. Negative for vessel beading. Negative for
aneurysm (when accounting for tortuosity of the proximal left PICA
as seen 3D renderings).

Venous sinuses: Patent

Anatomic variants: As above

Delayed phase: At obtained

Review of the MIP images confirms the above findings
IMPRESSION: 1. Negative CTA of the head and neck.
2. Mild centrilobular emphysema.

## 2021-06-09 NOTE — Progress Notes (Signed)
NEUROLOGY FOLLOW UP OFFICE NOTE  Brandon Summers [id number]  Assessment/Plan:   1.  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  Hyperlipidemia    1.  Secondary stroke prevention as managed by PCP: -  ASA 81mg  daily - if a fib is detected, switch to anticoagulation. - Atorvastatin 80mg .  LDL goal less than 70 - Blood pressure control. - Glycemic control. Hgb A1c goal less than 7 2.  Smoking cessation, Mediterranean diet, routine exercise 3.  Follow up as needed.  Subjective:  Brandon Summers is a 50 year old right-handed black man, smoker, who follows up for stroke.   UPDATE: Current medications:  ASA 81mg  daily; Lipitor 40mg  daily Implantable loop recorder has not identified a fib. He is doing well.    HISTORY: He was admitted to [facility] on 05/21/19 after presenting with blurred vision, headache and right sided numbness.  CT of head revealed no acute stroke.  MRI of brain, however, showed an acute large left thalamic infarct as well as significant chronic small vessel ischemic changes.  CTA of head and neck revealed no aneurysm or emergent large vessel stenosis or occlusion. 2D echo demonstrated EF 55-60% with no cardiac source of embolus.  TEE demonstrated no PFO or other cardiac source of embolus.  LDL was 114 and Hgb A1c was 5.7.  He was discharged on ASA 81mg  and Plavix 75mg  daily for 3 weeks, followed by ASA alone.  He was started on Lipitor 40mg  daily.  He was advised to stop smoking.     Following discharge, he reported some blurred vision.  Right hand hurts but all extremities feel "tired".  He reports that he is drooling from both sides of his face.  He says face feels "different" but he denies numbness and tingling sensation.  He says when he talks, he tends to talk slowly because if he talks, he gets a left sided headache.  He  underwent neuropsychological testing on 05/12/2020 which demonstrated mild vascular neurocognitive disorder with deficits believed to hinder patient's ability to maintain competitive employment.  He is now on disability.  He has not smoked since the stroke.  PAST MEDICAL HISTORY: Past Medical History:  Diagnosis Date   Cough    CVA (cerebral vascular accident) 05/21/2019   Left thalamic stroke   HLD (hyperlipidemia) 05/23/2019   Mild vascular neurocognitive disorder 05/12/2020   Nonintractable headache 04/15/2020   Pacemaker    Right sided weakness 09/09/2019   Shortness of breath    Visual problems    Vitamin D deficiency 11/2019    MEDICATIONS: Current Outpatient Medications on File Prior to Visit  Medication Sig Dispense Refill   albuterol (VENTOLIN HFA) 108 (90 Base) MCG/ACT inhaler Inhale 2 puffs into the lungs every 6 (six) hours as needed for wheezing or shortness of breath. 8 g 11   aspirin EC 81 MG tablet Take 81 mg by mouth daily. Swallow whole.     atorvastatin (LIPITOR) 40 MG tablet TAKE 1 TABLET BY MOUTH DAILY AT 6 PM. 90 tablet 0   busPIRone (BUSPAR) 10 MG tablet Take 10 mg by mouth daily.     Cholecalciferol (VITAMIN D) 50 MCG (2000 UT) CAPS Take 1 capsule by mouth daily.     gabapentin (NEURONTIN) 300 MG capsule Take 1 capsule (300 mg total) by mouth 2 (two) times daily. 60 capsule 6  topiramate (TOPAMAX) 50 MG tablet Take 1 tablet (50 mg total) by mouth 2 (two) times daily. 60 tablet 6   No current facility-administered medications on file prior to visit.    ALLERGIES: No Known Allergies  FAMILY HISTORY: Family History  Problem Relation Age of Onset   Hypertension Mother    Stroke Mother    Colon cancer Neg Hx    Esophageal cancer Neg Hx    Pancreatic cancer Neg Hx    Stomach cancer Neg Hx       Objective:  Blood pressure 125/81, pulse 74, height 6' (1.829 m), weight 190 lb (86.2 kg), SpO2 99 %. General: No acute distress.  Patient appears well-groomed.    Head:  Normocephalic/atraumatic Eyes:  Fundi examined but not visualized Neck: supple, no paraspinal tenderness, full range of motion Heart:  Regular rate and rhythm Lungs:  Clear to auscultation bilaterally Back: No paraspinal tenderness Neurological Exam: alert and oriented to person, place, and time. Some difficulty repeating and mild non-fluent speech.  Able to follow commands.  CN II-XII intact. Bulk and tone normal, muscle strength 5/5 throughout.  Sensation to light touch, temperature and vibration intact.  Deep tendon reflexes 2+ throughout, toes downgoing.  Finger to nose and heel to shin testing intact.  Gait normal, Romberg negative.  Shon Millet, DO

## 2021-06-11 ENCOUNTER — Other Ambulatory Visit: Payer: Self-pay

## 2021-06-11 ENCOUNTER — Encounter: Payer: Self-pay | Admitting: Neurology

## 2021-06-11 ENCOUNTER — Ambulatory Visit: Payer: Medicaid Other | Admitting: Neurology

## 2021-06-11 VITALS — BP 125/81 | HR 74 | Ht 72.0 in | Wt 190.0 lb

## 2021-06-11 DIAGNOSIS — F0150 Vascular dementia without behavioral disturbance: Secondary | ICD-10-CM

## 2021-06-11 DIAGNOSIS — I639 Cerebral infarction, unspecified: Secondary | ICD-10-CM | POA: Diagnosis not present

## 2021-06-11 DIAGNOSIS — I69320 Aphasia following cerebral infarction: Secondary | ICD-10-CM | POA: Diagnosis not present

## 2021-06-11 DIAGNOSIS — I6381 Other cerebral infarction due to occlusion or stenosis of small artery: Secondary | ICD-10-CM

## 2021-06-11 DIAGNOSIS — F01A0 Vascular dementia, mild, without behavioral disturbance, psychotic disturbance, mood disturbance, and anxiety: Secondary | ICD-10-CM

## 2021-06-11 DIAGNOSIS — E7849 Other hyperlipidemia: Secondary | ICD-10-CM

## 2021-06-11 NOTE — Patient Instructions (Addendum)
Continue aspirin 81mg  daily and atorvastatin Follow up as needed

## 2021-06-21 NOTE — Progress Notes (Signed)
Carelink Summary Report / Loop Recorder 

## 2021-06-30 ENCOUNTER — Ambulatory Visit: Payer: Medicaid Other | Admitting: Family Medicine

## 2021-06-30 ENCOUNTER — Other Ambulatory Visit: Payer: Self-pay | Admitting: Nurse Practitioner

## 2021-06-30 ENCOUNTER — Encounter: Payer: Self-pay | Admitting: Nurse Practitioner

## 2021-06-30 ENCOUNTER — Other Ambulatory Visit: Payer: Self-pay

## 2021-06-30 ENCOUNTER — Ambulatory Visit: Payer: Medicaid Other | Admitting: Nurse Practitioner

## 2021-06-30 VITALS — BP 113/72 | HR 70 | Temp 98.2°F | Ht 72.0 in | Wt 189.0 lb

## 2021-06-30 DIAGNOSIS — D582 Other hemoglobinopathies: Secondary | ICD-10-CM | POA: Diagnosis not present

## 2021-06-30 DIAGNOSIS — Z125 Encounter for screening for malignant neoplasm of prostate: Secondary | ICD-10-CM | POA: Diagnosis not present

## 2021-06-30 DIAGNOSIS — Z8673 Personal history of transient ischemic attack (TIA), and cerebral infarction without residual deficits: Secondary | ICD-10-CM | POA: Diagnosis not present

## 2021-06-30 DIAGNOSIS — E7849 Other hyperlipidemia: Secondary | ICD-10-CM

## 2021-06-30 DIAGNOSIS — Z114 Encounter for screening for human immunodeficiency virus [HIV]: Secondary | ICD-10-CM | POA: Diagnosis not present

## 2021-06-30 DIAGNOSIS — Z1159 Encounter for screening for other viral diseases: Secondary | ICD-10-CM | POA: Diagnosis not present

## 2021-06-30 DIAGNOSIS — Z13220 Encounter for screening for lipoid disorders: Secondary | ICD-10-CM | POA: Diagnosis not present

## 2021-06-30 MED ORDER — TRIAMCINOLONE ACETONIDE 0.5 % EX OINT: 1.0000 "application " | TOPICAL_OINTMENT | Freq: Two times a day (BID) | CUTANEOUS | 0 refills | Status: DC

## 2021-06-30 NOTE — Progress Notes (Signed)
[facility] Patient [facility] [address] [place], [place]  [address] Phone:  [phone]   Fax:  [phone]    Established Patient Office Visit  Subjective:  Patient ID: Brandon Summers, male    DOB: 1971-06-22  Age: 50 y.o. MRN: [id number]  CC:  Chief Complaint  Patient presents with   Follow-up    Dark spots on both legs, 4 months     HPI Brandon Summers presents for follow up. 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   Rash Patient presents for evaluation of a rash involving the leg. Rash started several weeks ago. Lesions are dark, and raised in texture. Rash has not changed over time. Rash is pruritic. Associated symptoms: none. Patient denies: abdominal pain, arthralgia, congestion, cough, crankiness, decrease in appetite, decrease in energy level, fever, headache, irritability, myalgia, nausea, sore throat, and vomiting. Patient has not had contacts with similar rash. Patient has not had new exposures (soaps, lotions, laundry detergents, foods, medications, plants, insects or animals).  Past Medical History:  Diagnosis Date   Cough    CVA (cerebral vascular accident) 05/21/2019   Left thalamic stroke   HLD (hyperlipidemia) 05/23/2019   Mild vascular neurocognitive disorder 05/12/2020   Nonintractable headache 04/15/2020   Pacemaker    Right sided weakness 09/09/2019   Shortness of breath    Visual problems    Vitamin D deficiency 11/2019    Past Surgical History:  Procedure Laterality Date   BUBBLE STUDY  05/23/2019   Procedure: BUBBLE STUDY;  Surgeon: Chrystie Nose, MD;  Location: [facility] ENDOSCOPY;  Service: Cardiovascular;;   implantable loop recorder placement  07/26/2019   MDT  Reveal [id number] implanted by Dr Johney Frame for cryptogenic stroke   TEE WITHOUT CARDIOVERSION N/A 05/23/2019   Procedure: TRANSESOPHAGEAL ECHOCARDIOGRAM (TEE);  Surgeon: Chrystie Nose, MD;  Location: [facility] ENDOSCOPY;  Service: Cardiovascular;  Laterality: N/A;    Family History  Problem Relation Age of Onset   Hypertension Mother    Stroke Mother    Colon cancer Neg Hx    Esophageal cancer Neg Hx    Pancreatic cancer Neg Hx    Stomach cancer Neg Hx     Social History   Socioeconomic History   Marital status: Married    Spouse name: Not on file   Number of children: Not on file   Years of education: 12   Highest education level: High school graduate  Occupational History   Occupation: Chef  Tobacco Use   Smoking status: Former    Types: Cigarettes    Quit date: 05/21/2019    Years since quitting: 2.1   Smokeless tobacco: Never  Vaping Use   Vaping Use: Never used  Substance and Sexual Activity   Alcohol use: Not Currently   Drug use: No   Sexual activity: Not Currently  Other Topics Concern   Not on file  Social History Narrative   Marital status: separated; moved from [place] to [place] 1998   Children: 2 children; no grandchildren.   Employment: chef   Tobacco: 1/2ppd   Alcohol: none   Drugs: none   Exercise: none.    no Caffeine   Lives in a condo in a one story floor plan   Social Determinants of Health   Financial Resource Strain: Not  on file  Food Insecurity: Not on file  Transportation Needs: Not on file  Physical Activity: Not on file  Stress: Not on file  Social Connections: Not on file  Intimate Partner Violence: Not on file    Outpatient Medications Prior to Visit  Medication Sig Dispense Refill   albuterol (VENTOLIN HFA) 108 (90 Base) MCG/ACT inhaler Inhale 2 puffs into the lungs every 6 (six) hours as needed for wheezing or shortness of breath. 8 g 11   aspirin EC 81 MG tablet Take 81 mg by mouth daily. Swallow whole.     atorvastatin (LIPITOR) 40 MG  tablet TAKE 1 TABLET BY MOUTH DAILY AT 6 PM. 90 tablet 0   busPIRone (BUSPAR) 10 MG tablet Take 10 mg by mouth daily.     Cholecalciferol (VITAMIN D) 50 MCG (2000 UT) CAPS Take 1 capsule by mouth daily.     gabapentin (NEURONTIN) 300 MG capsule Take 1 capsule (300 mg total) by mouth 2 (two) times daily. 60 capsule 6   topiramate (TOPAMAX) 50 MG tablet Take 1 tablet (50 mg total) by mouth 2 (two) times daily. 60 tablet 6   No facility-administered medications prior to visit.    No Known Allergies  ROS Review of Systems    Objective:    Physical Exam Constitutional:      General: He is not in acute distress.    Appearance: He is not ill-appearing, toxic-appearing or diaphoretic.  HENT:     Head: Normocephalic and atraumatic.     Nose: Nose normal.     Mouth/Throat:     Mouth: Mucous membranes are moist.  Cardiovascular:     Rate and Rhythm: Normal rate and regular rhythm.     Pulses: Normal pulses.     Heart sounds: Normal heart sounds.  Pulmonary:     Effort: Pulmonary effort is normal.     Breath sounds: Normal breath sounds.  Abdominal:     Palpations: Abdomen is soft.  Musculoskeletal:        General: Normal range of motion.     Cervical back: Normal range of motion.  Skin:    General: Skin is warm and dry.     Capillary Refill: Capillary refill takes less than 2 seconds.  Neurological:     General: No focal deficit present.     Mental Status: He is alert and oriented to person, place, and time.  Psychiatric:        Mood and Affect: Mood normal.        Behavior: Behavior normal.        Thought Content: Thought content normal.        Judgment: Judgment normal.   BP 113/72 (BP Location: Left Arm, Patient Position: Sitting)   Pulse 70   Temp 98.2 F (36.8 C)   Ht 6' (1.829 m)   Wt 189 lb 0.6 oz (85.7 kg)   SpO2 99%   BMI 25.64 kg/m  Wt Readings from Last 3 Encounters:  06/30/21 189 lb 0.6 oz (85.7 kg)  06/11/21 190 lb (86.2 kg)  02/04/21 194 lb 8 oz (88.2  kg)     Health Maintenance Due  Topic Date Due   Pneumococcal Vaccine 81-52 Years old (1 - PCV) Never done   Hepatitis C Screening  Never done   COLONOSCOPY (Pts 45-43yrs Insurance coverage will need to be confirmed)  Never done   COVID-19 Vaccine (3 - Booster for Pfizer series) 04/17/2021   INFLUENZA VACCINE  06/28/2021  There are no preventive care reminders to display for this patient.  Lab Results  Component Value Date   TSH 1.680 09/30/2020   Lab Results  Component Value Date   WBC 5.4 09/30/2020   HGB 17.1 09/30/2020   HCT 54.9 (H) 09/30/2020   MCV 74 (L) 09/30/2020   PLT 262 09/30/2020   Lab Results  Component Value Date   NA 143 09/30/2020   K 4.5 09/30/2020   CO2 26 09/30/2020   GLUCOSE 90 09/30/2020   BUN 8 09/30/2020   CREATININE 0.98 09/30/2020   BILITOT 0.4 09/30/2020   ALKPHOS 64 09/30/2020   AST 28 09/30/2020   ALT 64 (H) 09/30/2020   PROT 6.2 09/30/2020   ALBUMIN 4.1 09/30/2020   CALCIUM 9.2 09/30/2020   ANIONGAP 7 10/13/2019   Lab Results  Component Value Date   CHOL 113 09/30/2020   Lab Results  Component Value Date   HDL 48 09/30/2020   Lab Results  Component Value Date   LDLCALC 54 09/30/2020   Lab Results  Component Value Date   TRIG 45 09/30/2020   Lab Results  Component Value Date   CHOLHDL 2.4 09/30/2020   Lab Results  Component Value Date   HGBA1C 5.7 (H) 05/22/2019      Assessment & Plan:   Problem List Items Addressed This Visit       Other   HLD (hyperlipidemia) Continue with current regimen.  No changes warranted. Good patient compliance.    Relevant Orders   Comp. Metabolic Panel (12)   Lipid panel   Other Visit Diagnoses     Encounter for hepatitis C screening test for low risk patient    -  Primary   Relevant Orders   Hepatitis C antibody   Screening for HIV (human immunodeficiency virus)       Relevant Orders   HIV Antibody (routine testing w rflx)   Screening for malignant neoplasm of  prostate       Relevant Orders   PSA   History of CVA (cerebrovascular accident)     Continues with residual on the left    Screening for cholesterol level       Relevant Orders   TSH   Abnormal hemoglobin (Hgb) (HCC)       Relevant Orders   CBC with Differential/Platelet       Meds ordered this encounter  Medications   triamcinolone ointment (KENALOG) 0.5 %    Sig: Apply 1 application topically 2 (two) times daily.    Dispense:  30 g    Refill:  0    Order Specific Question:   Supervising Provider    Answer:   Quentin Angst [[id number]]    Follow-up: Return in about 6 months (around 12/31/2021) for follow up medication management [id number].    Barbette Merino, NP

## 2021-06-30 NOTE — Patient Instructions (Signed)
Rash, Adult  A rash is a change in the color of your skin. A rash can also change the way your skin feels. There are many different conditions and factors that can causea rash. Follow these instructions at home: The goal of treatment is to stop the itching and keep the rash from spreading. Watch for any changes in your symptoms. Let your doctor know about them. Followthese instructions to help with your condition: Medicine Take or apply over-the-counter and prescription medicines only as told by your doctor. These may include medicines: To treat red or swollen skin (corticosteroid creams). To treat itching. To treat an allergy (oral antihistamines). To treat very bad symptoms (oral corticosteroids).  Skin care Put cool cloths (compresses) on the affected areas. Do not scratch or rub your skin. Avoid covering the rash. Make sure that the rash is exposed to air as much as possible. Managing itching and discomfort Avoid hot showers or baths. These can make itching worse. A cold shower may help. Try taking a bath with: Epsom salts. You can get these at your local pharmacy or grocery store. Follow the instructions on the package. Baking soda. Pour a small amount into the bath as told by your doctor. Colloidal oatmeal. You can get this at your local pharmacy or grocery store. Follow the instructions on the package. Try putting baking soda paste onto your skin. Stir water into baking soda until it gets like a paste. Try putting on a lotion that relieves itchiness (calamine lotion). Keep cool and out of the sun. Sweating and being hot can make itching worse. General instructions  Rest as needed. Drink enough fluid to keep your pee (urine) pale yellow. Wear loose-fitting clothing. Avoid scented soaps, detergents, and perfumes. Use gentle soaps, detergents, perfumes, and other cosmetic products. Avoid anything that causes your rash. Keep a journal to help track what causes your rash. Write  down: What you eat. What cosmetic products you use. What you drink. What you wear. This includes jewelry. Keep all follow-up visits as told by your doctor. This is important.  Contact a doctor if: You sweat at night. You lose weight. You pee (urinate) more than normal. You pee less than normal, or you notice that your pee is a darker color than normal. You feel weak. You throw up (vomit). Your skin or the whites of your eyes look yellow (jaundice). Your skin: Tingles. Is numb. Your rash: Does not go away after a few days. Gets worse. You are: More thirsty than normal. More tired than normal. You have: New symptoms. Pain in your belly (abdomen). A fever. Watery poop (diarrhea). Get help right away if: You have a fever and your symptoms suddenly get worse. You start to feel mixed up (confused). You have a very bad headache or a stiff neck. You have very bad joint pains or stiffness. You have jerky movements that you cannot control (seizure). Your rash covers all or most of your body. The rash may or may not be painful. You have blisters that: Are on top of the rash. Grow larger. Grow together. Are painful. Are inside your nose or mouth. You have a rash that: Looks like purple pinprick-sized spots all over your body. Has a "bull's eye" or looks like a target. Is red and painful, causes your skin to peel, and is not from being in the sun too long. Summary A rash is a change in the color of your skin. A rash can also change the way your skin feels.   The goal of treatment is to stop the itching and keep the rash from spreading. Take or apply over-the-counter and prescription medicines only as told by your doctor. Contact a doctor if you have new symptoms or symptoms that get worse. Keep all follow-up visits as told by your doctor. This is important. This information is not intended to replace advice given to you by your health care provider. Make sure you discuss any  questions you have with your healthcare provider. Healthy Eating Following a healthy eating pattern may help you to achieve and maintain a healthy body weight, reduce the risk of chronic disease, and live a long and productive life. It is important to follow a healthy eating pattern at an appropriate calorie level for your body. Your nutritional needs should be metprimarily through food by choosing a variety of nutrient-rich foods. What are tips for following this plan? Reading food labels Read labels and choose the following: Reduced or low sodium. Juices with 100% fruit juice. Foods with low saturated fats and high polyunsaturated and monounsaturated fats. Foods with whole grains, such as whole wheat, cracked wheat, brown rice, and wild rice. Whole grains that are fortified with folic acid. This is recommended for women who are pregnant or who want to become pregnant. Read labels and avoid the following: Foods with a lot of added sugars. These include foods that contain brown sugar, corn sweetener, corn syrup, dextrose, fructose, glucose, high-fructose corn syrup, honey, invert sugar, lactose, malt syrup, maltose, molasses, raw sugar, sucrose, trehalose, or turbinado sugar. Do not eat more than the following amounts of added sugar per day: 6 teaspoons (25 g) for women. 9 teaspoons (38 g) for men. Foods that contain processed or refined starches and grains. Refined grain products, such as white flour, degermed cornmeal, white bread, and white rice. Shopping Choose nutrient-rich snacks, such as vegetables, whole fruits, and nuts. Avoid high-calorie and high-sugar snacks, such as potato chips, fruit snacks, and candy. Use oil-based dressings and spreads on foods instead of solid fats such as butter, stick margarine, or cream cheese. Limit pre-made sauces, mixes, and "instant" products such as flavored rice, instant noodles, and ready-made pasta. Try more plant-protein sources, such as tofu,  tempeh, black beans, edamame, lentils, nuts, and seeds. Explore eating plans such as the Mediterranean diet or vegetarian diet. Cooking Use oil to saut or stir-fry foods instead of solid fats such as butter, stick margarine, or lard. Try baking, boiling, grilling, or broiling instead of frying. Remove the fatty part of meats before cooking. Steam vegetables in water or broth. Meal planning  At meals, imagine dividing your plate into fourths: One-half of your plate is fruits and vegetables. One-fourth of your plate is whole grains. One-fourth of your plate is protein, especially lean meats, poultry, eggs, tofu, beans, or nuts. Include low-fat dairy as part of your daily diet.  Lifestyle Choose healthy options in all settings, including home, work, school, restaurants, or stores. Prepare your food safely: Wash your hands after handling raw meats. Keep food preparation surfaces clean by regularly washing with hot, soapy water. Keep raw meats separate from ready-to-eat foods, such as fruits and vegetables. Cook seafood, meat, poultry, and eggs to the recommended internal temperature. Store foods at safe temperatures. In general: Keep cold foods at 76F (4.4C) or below. Keep hot foods at 176F (60C) or above. Keep your freezer at [facility] (-17.8C) or below. Foods are no longer safe to eat when they have been between the temperatures of 40-176F (4.4-60C) for more  than 2 hours. What foods should I eat? Fruits Aim to eat 2 cup-equivalents of fresh, canned (in natural juice), or frozen fruits each day. Examples of 1 cup-equivalent of fruit include 1 small apple, 8large strawberries, 1 cup canned fruit,  cup dried fruit, or 1 cup 100% juice. Vegetables Aim to eat 2-3 cup-equivalents of fresh and frozen vegetables each day, including different varieties and colors. Examples of 1 cup-equivalent of vegetables include 2 medium carrots, 2 cups raw, leafy greens, 1 cup choppedvegetable (raw or  cooked), or 1 medium baked potato. Grains Aim to eat 6 ounce-equivalents of whole grains each day. Examples of 1 ounce-equivalent of grains include 1 slice of bread, 1 cup ready-to-eat cereal,3 cups popcorn, or  cup cooked rice, pasta, or cereal. Meats and other proteins Aim to eat 5-6 ounce-equivalents of protein each day. Examples of 1 ounce-equivalent of protein include 1 egg, 1/2 cup nuts or seeds, or 1 tablespoon (16 g) peanut butter. A cut of meat or fish that is the size of a deck of cards is about 3-4 ounce-equivalents. Of the protein you eat each week, try to have at least 8 ounces come from seafood. This includes salmon, trout, herring, and anchovies. Dairy Aim to eat 3 cup-equivalents of fat-free or low-fat dairy each day. Examples of 1 cup-equivalent of dairy include 1 cup (240 mL) milk, 8 ounces (250 g) yogurt,1 ounces (44 g) natural cheese, or 1 cup (240 mL) fortified soy milk. Fats and oils Aim for about 5 teaspoons (21 g) per day. Choose monounsaturated fats, such as canola and olive oils, avocados, peanut butter, and most nuts, or polyunsaturated fats, such as sunflower, corn, and soybean oils, walnuts, pine nuts, sesame seeds, sunflower seeds, and flaxseed. Beverages Aim for six 8-oz glasses of water per day. Limit coffee to three to five 8-oz cups per day. Limit caffeinated beverages that have added calories, such as soda and energy drinks. Limit alcohol intake to no more than 1 drink a day for nonpregnant women and 2 drinks a day for men. One drink equals 12 oz of beer (355 mL), 5 oz of wine (148 mL), or 1 oz of hard liquor (44 mL). Seasoning and other foods Avoid adding excess amounts of salt to your foods. Try flavoring foods with herbs and spices instead of salt. Avoid adding sugar to foods. Try using oil-based dressings, sauces, and spreads instead of solid fats. This information is based on general [place] nutrition guidelines. For more information, visit [employer].  Exact amounts may vary based on your nutrition needs. Summary A healthy eating plan may help you to maintain a healthy weight, reduce the risk of chronic diseases, and stay active throughout your life. Plan your meals. Make sure you eat the right portions of a variety of nutrient-rich foods. Try baking, boiling, grilling, or broiling instead of frying. Choose healthy options in all settings, including home, work, school, restaurants, or stores. This information is not intended to replace advice given to you by your health care provider. Make sure you discuss any questions you have with your healthcare provider. Document Revised: 02/26/2018 Document Reviewed: 02/26/2018 Elsevier Patient Education  2022 [employer]vier Inc.  Document Revised: 03/08/2019 Document Reviewed: 06/18/2018 Elsevier Patient Education  2022 [employer].

## 2021-07-01 LAB — LIPID PANEL
Chol/HDL Ratio: 2.5 ratio (ref 0.0–5.0)
Cholesterol, Total: 104 mg/dL (ref 100–199)
HDL: 42 mg/dL (ref >39)
LDL Chol Calc (NIH): 49 mg/dL (ref 0–99)
Triglycerides: 59 mg/dL (ref 0–149)
VLDL Cholesterol Cal: 13 mg/dL (ref 5–40)

## 2021-07-01 LAB — CBC WITH DIFFERENTIAL/PLATELET
Basophils Absolute: 0 10*3/uL (ref 0.0–0.2)
Basos: 1 %
EOS (ABSOLUTE): 0.1 10*3/uL (ref 0.0–0.4)
Eos: 2 %
Hematocrit: 55.5 % — ABNORMAL HIGH (ref 37.5–51.0)
Hemoglobin: 16.9 g/dL (ref 13.0–17.7)
Immature Grans (Abs): 0 10*3/uL (ref 0.0–0.1)
Immature Granulocytes: 0 %
Lymphocytes Absolute: 2.1 10*3/uL (ref 0.7–3.1)
Lymphs: 41 %
MCH: 22.3 pg — ABNORMAL LOW (ref 26.6–33.0)
MCHC: 30.5 g/dL — ABNORMAL LOW (ref 31.5–35.7)
MCV: 73 fL — ABNORMAL LOW (ref 79–97)
Monocytes Absolute: 0.5 10*3/uL (ref 0.1–0.9)
Monocytes: 9 %
Neutrophils Absolute: 2.4 10*3/uL (ref 1.4–7.0)
Neutrophils: 47 %
Platelets: 255 10*3/uL (ref 150–450)
RBC: 7.59 x10E6/uL (ref 4.14–5.80)
RDW: 18.2 % — ABNORMAL HIGH (ref 11.6–15.4)
WBC: 5.1 10*3/uL (ref 3.4–10.8)

## 2021-07-01 LAB — COMP. METABOLIC PANEL (12)
AST: 23 IU/L (ref 0–40)
Albumin/Globulin Ratio: 2.2 (ref 1.2–2.2)
Albumin: 4.1 g/dL (ref 4.0–5.0)
Alkaline Phosphatase: 55 IU/L (ref 44–121)
BUN/Creatinine Ratio: 16 (ref 9–20)
BUN: 14 mg/dL (ref 6–24)
Bilirubin Total: 0.4 mg/dL (ref 0.0–1.2)
Calcium: 9.8 mg/dL (ref 8.7–10.2)
Chloride: 100 mmol/L (ref 96–106)
Creatinine, Ser: 0.89 mg/dL (ref 0.76–1.27)
Globulin, Total: 1.9 g/dL (ref 1.5–4.5)
Glucose: 111 mg/dL — ABNORMAL HIGH (ref 65–99)
Potassium: 4.7 mmol/L (ref 3.5–5.2)
Sodium: 142 mmol/L (ref 134–144)
Total Protein: 6 g/dL (ref 6.0–8.5)
eGFR: 105 mL/min/{1.73_m2} (ref >59)

## 2021-07-01 LAB — CUP PACEART REMOTE DEVICE CHECK
Date Time Interrogation Session: 20220801230224
Implantable Pulse Generator Implant Date: 20200828

## 2021-07-01 LAB — PSA: Prostate Specific Ag, Serum: 1.3 ng/mL (ref 0.0–4.0)

## 2021-07-01 LAB — TSH: TSH: 0.82 u[IU]/mL (ref 0.450–4.500)

## 2021-07-01 LAB — HEPATITIS C ANTIBODY: Hep C Virus Ab: <0.1 s/co ratio (ref 0.0–0.9)

## 2021-07-01 LAB — HIV ANTIBODY (ROUTINE TESTING W REFLEX): HIV Screen 4th Generation wRfx: NONREACTIVE

## 2021-07-05 ENCOUNTER — Ambulatory Visit (INDEPENDENT_AMBULATORY_CARE_PROVIDER_SITE_OTHER): Payer: Medicaid Other

## 2021-07-05 DIAGNOSIS — I639 Cerebral infarction, unspecified: Secondary | ICD-10-CM

## 2021-07-22 ENCOUNTER — Other Ambulatory Visit: Payer: Self-pay | Admitting: Nurse Practitioner

## 2021-07-29 NOTE — Progress Notes (Signed)
Carelink Summary Report / Loop Recorder 

## 2021-08-09 ENCOUNTER — Ambulatory Visit (INDEPENDENT_AMBULATORY_CARE_PROVIDER_SITE_OTHER): Payer: Medicaid Other

## 2021-08-09 DIAGNOSIS — I639 Cerebral infarction, unspecified: Secondary | ICD-10-CM

## 2021-08-11 LAB — CUP PACEART REMOTE DEVICE CHECK
Date Time Interrogation Session: 20220903230839
Implantable Pulse Generator Implant Date: 20200828

## 2021-08-18 NOTE — Progress Notes (Signed)
Carelink Summary Report / Loop Recorder 

## 2021-08-20 ENCOUNTER — Other Ambulatory Visit: Payer: Self-pay | Admitting: Nurse Practitioner

## 2021-08-20 ENCOUNTER — Telehealth: Payer: Self-pay

## 2021-08-20 DIAGNOSIS — E785 Hyperlipidemia, unspecified: Secondary | ICD-10-CM

## 2021-08-20 NOTE — Telephone Encounter (Signed)
Atorvastatin

## 2021-09-11 IMAGING — CR DG CHEST 2V
2 series · 2 of 2 positions shown · non-contrast
Comparison: May 22, 2019

CLINICAL DATA: Chest pain

EXAM:
CHEST - 2 VIEW

[w chest pa]
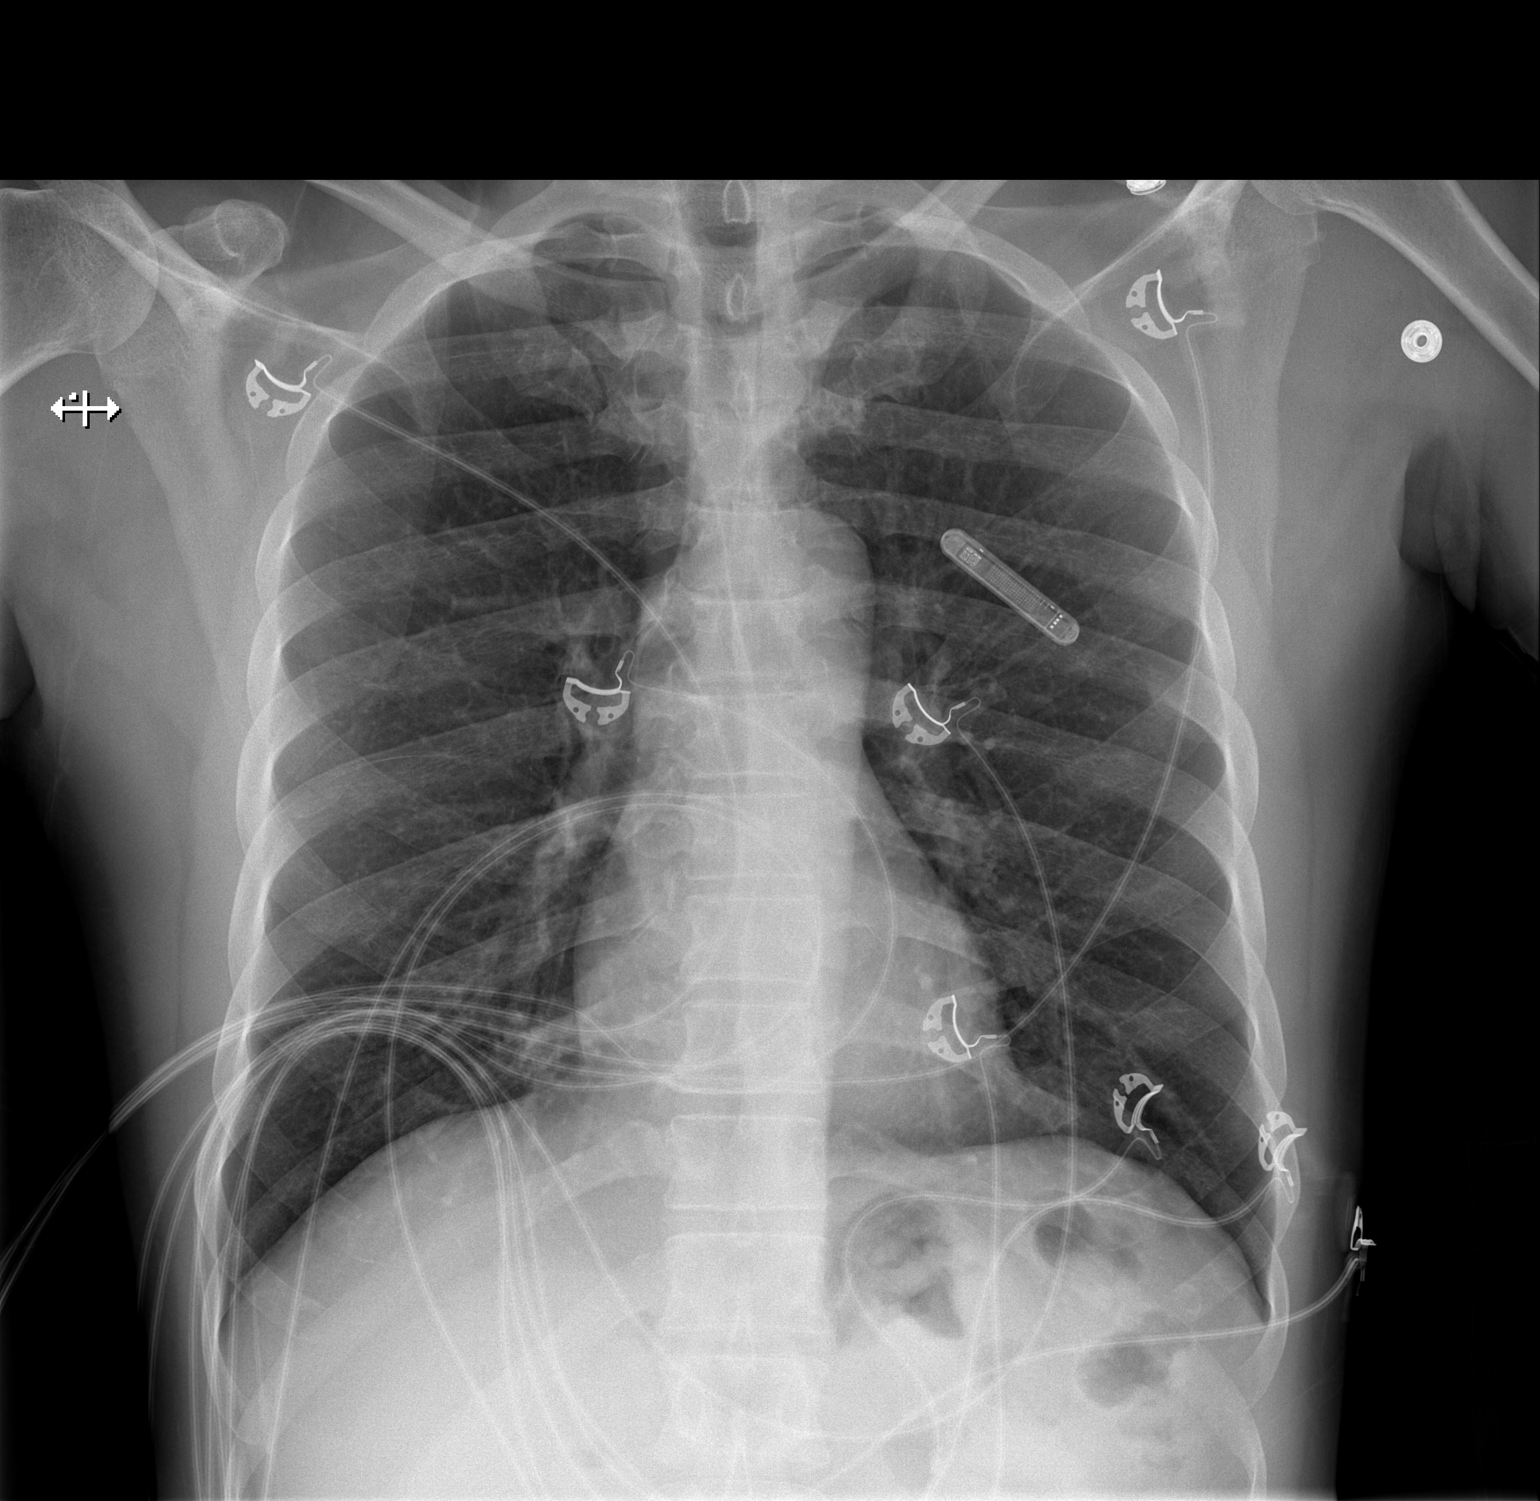

[w chest lat]
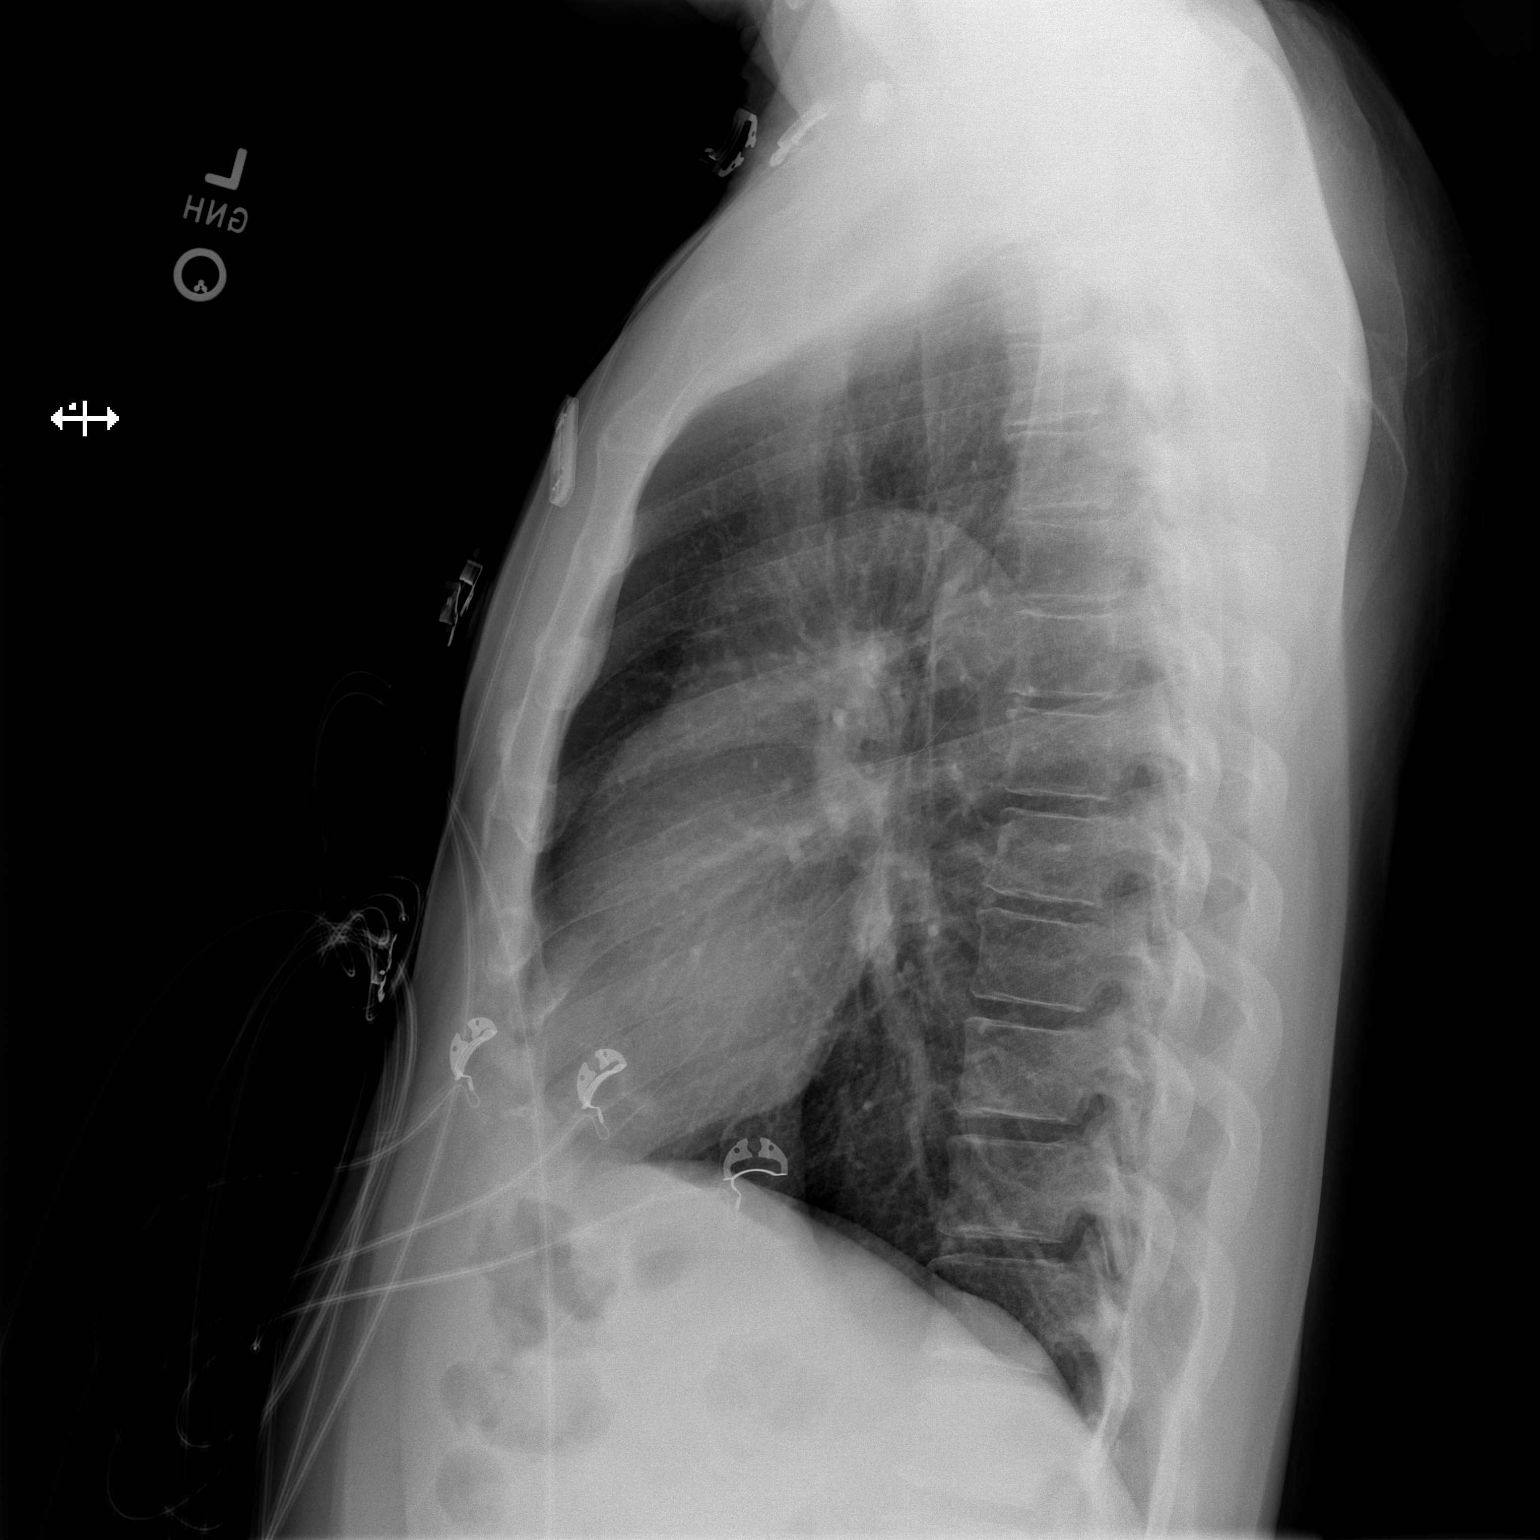

[2 of 2 positions shown; findings below may reference images not displayed]

FINDINGS: Lungs are clear. Heart size and pulmonary vascularity are normal. No
adenopathy. There is a loop recorder on the left. No pneumothorax.
No bone lesions.
IMPRESSION: No edema or consolidation.

## 2021-09-13 ENCOUNTER — Ambulatory Visit (INDEPENDENT_AMBULATORY_CARE_PROVIDER_SITE_OTHER): Payer: Medicaid Other

## 2021-09-13 DIAGNOSIS — I639 Cerebral infarction, unspecified: Secondary | ICD-10-CM

## 2021-09-15 LAB — CUP PACEART REMOTE DEVICE CHECK
Date Time Interrogation Session: 20221007000319
Implantable Pulse Generator Implant Date: 20200828

## 2021-09-22 NOTE — Progress Notes (Signed)
Carelink Summary Report / Loop Recorder 

## 2021-10-06 ENCOUNTER — Encounter: Payer: Self-pay | Admitting: Nurse Practitioner

## 2021-10-06 ENCOUNTER — Other Ambulatory Visit: Payer: Self-pay

## 2021-10-06 ENCOUNTER — Ambulatory Visit: Payer: Medicaid Other | Admitting: Nurse Practitioner

## 2021-10-06 VITALS — BP 129/89 | HR 72 | Temp 98.2°F | Ht 72.0 in | Wt 185.0 lb

## 2021-10-06 DIAGNOSIS — R21 Rash and other nonspecific skin eruption: Secondary | ICD-10-CM

## 2021-10-06 DIAGNOSIS — Q383 Other congenital malformations of tongue: Secondary | ICD-10-CM | POA: Diagnosis not present

## 2021-10-06 DIAGNOSIS — N4889 Other specified disorders of penis: Secondary | ICD-10-CM

## 2021-10-06 DIAGNOSIS — S30812A Abrasion of penis, initial encounter: Secondary | ICD-10-CM | POA: Diagnosis not present

## 2021-10-06 DIAGNOSIS — R042 Hemoptysis: Secondary | ICD-10-CM

## 2021-10-06 LAB — POCT URINALYSIS DIP (CLINITEK)
Bilirubin, UA: NEGATIVE
Blood, UA: NEGATIVE
Glucose, UA: NEGATIVE mg/dL
Ketones, POC UA: NEGATIVE mg/dL
Leukocytes, UA: NEGATIVE
Nitrite, UA: NEGATIVE
POC PROTEIN,UA: NEGATIVE
Spec Grav, UA: 1.015 (ref 1.010–1.025)
Urobilinogen, UA: 0.2 E.U./dL
pH, UA: 7 (ref 5.0–8.0)

## 2021-10-06 MED ORDER — BACITRACIN 500 UNIT/GM EX OINT: 1.0000 "application " | TOPICAL_OINTMENT | Freq: Two times a day (BID) | CUTANEOUS | 0 refills | Status: DC

## 2021-10-06 NOTE — Patient Instructions (Addendum)
You were seen today in the [facility] for abrasion, rash and hemoptysis. Labs were collected, results will be available via MyChart or, if abnormal, you will be contacted by clinic staff. You were prescribed medications, please take as directed. Please follow up in 2 wks for reevaluation.   Bacitracin (triple antibiotic ointment ) was prescribed, please apply to affected area on penis x 8 days. Please notify the clinic if pain worsens or you notify an discharge from penis.   Referral was sent to dentistry with regards to blood in your sputum, concerned that you may be biting facial cheek and/ tongue while sleep. Please contact clinic if bleeding increases or you develop pain.  Please begin applying steroid cream to rash on back as discussed during your visit.

## 2021-10-06 NOTE — Progress Notes (Signed)
[address] [place], [place]  [address] Phone:  [phone]   Fax:  [phone] Subjective:   Patient ID: Brandon Summers, male    DOB: [birth year], 50 y.o.   MRN: [id number]  Chief Complaint  Patient presents with   Acute Visit    Patient stated he may have scratch in penis hurts while sitting, scratch on back    HPI Brandon Summers 50 y.o. 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 To the [facility] for rash, blood in sputum and rash.  Patient states that he developed rash to right upper back 3 mths ago. Has not used any medications for treatment of rash. Denies any itching or pain. Denies any drainage. Has chronic rash to bilateral ankles, which he applies steroid cream to daily, but rash on back is different in appearance.  Also complaining of scratch on penis x 2-3 days. Occurred while having intercourse. Denies any discharge, endorses pain when sitting. Currently rates pain 7/10 and describes as indescribable. Had similar symptoms 30 yrs ago and was prescribed antibiotics and pain medication, with subsequent resolution of symptoms. Denies any dysuria.  Concerned about small amount of blood in sputum. States that he has had blood in sputum x 3 mths. Only notes blood when first awaking in the morning. Denies any pain, injury or trauma to oral cavity. Denies being evaluated for symptoms or taking medications. Denies any other complaints today.   Denies any fever. Denies any fatigue, chest pain, shortness of breath, HA or dizziness. Denies any blurred vision, numbness or tingling.   Past Medical History:  Diagnosis Date   Cough    CVA (cerebral vascular accident) 05/21/2019    Left thalamic stroke   HLD (hyperlipidemia) 05/23/2019   Mild vascular neurocognitive disorder 05/12/2020   Nonintractable headache 04/15/2020   Pacemaker    Right sided weakness 09/09/2019   Shortness of breath    Visual problems    Vitamin D deficiency 11/2019    Past Surgical History:  Procedure Laterality Date   BUBBLE STUDY  05/23/2019   Procedure: BUBBLE STUDY;  Surgeon: Pixie Casino, MD;  Location: [facility] ENDOSCOPY;  Service: Cardiovascular;;   implantable loop recorder placement  07/26/2019   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   TEE WITHOUT CARDIOVERSION N/A 05/23/2019   Procedure: TRANSESOPHAGEAL ECHOCARDIOGRAM (TEE);  Surgeon: Pixie Casino, MD;  Location: [facility] ENDOSCOPY;  Service: Cardiovascular;  Laterality: N/A;    Family History  Problem Relation Age of Onset   Hypertension Mother    Stroke Mother    Colon cancer Neg Hx    Esophageal cancer Neg Hx    Pancreatic cancer Neg Hx    Stomach cancer Neg Hx     Social History   Socioeconomic History   Marital status: Married    Spouse name: Not on file   Number of children: Not on file   Years of education: 12   Highest education level: High school graduate  Occupational History   Occupation: Chef  Tobacco Use   Smoking status: Former    Types: Cigarettes    Quit date: 05/21/2019    Years since quitting: 2.3   Smokeless tobacco: Never  Vaping Use   Vaping Use: Never used  Substance and Sexual  Activity   Alcohol use: Not Currently   Drug use: No   Sexual activity: Not Currently  Other Topics Concern   Not on file  Social History Narrative   Marital status: separated; moved from [place] to [place] 1998   Children: 2 children; no grandchildren.   Employment: chef   Tobacco: 1/2ppd   Alcohol: none   Drugs: none   Exercise: none.    no Caffeine   Lives in a condo in a one story floor plan   Social Determinants of Health   Financial Resource Strain: Not on file  Food Insecurity:  Not on file  Transportation Needs: Not on file  Physical Activity: Not on file  Stress: Not on file  Social Connections: Not on file  Intimate Partner Violence: Not on file    Outpatient Medications Prior to Visit  Medication Sig Dispense Refill   albuterol (VENTOLIN HFA) 108 (90 Base) MCG/ACT inhaler Inhale 2 puffs into the lungs every 6 (six) hours as needed for wheezing or shortness of breath. 8 g 11   aspirin EC 81 MG tablet Take 81 mg by mouth daily. Swallow whole.     atorvastatin (LIPITOR) 40 MG tablet TAKE 1 TABLET BY MOUTH DAILY AT 6 PM. 90 tablet 0   busPIRone (BUSPAR) 10 MG tablet Take 10 mg by mouth daily.     Cholecalciferol (VITAMIN D) 50 MCG (2000 UT) CAPS Take 1 capsule by mouth daily.     gabapentin (NEURONTIN) 300 MG capsule Take 1 capsule (300 mg total) by mouth 2 (two) times daily. 60 capsule 6   topiramate (TOPAMAX) 50 MG tablet Take 1 tablet (50 mg total) by mouth 2 (two) times daily. 60 tablet 6   triamcinolone ointment (KENALOG) 0.5 % APPLY TO AFFECTED AREA TWICE A DAY 30 g 0   No facility-administered medications prior to visit.    No Known Allergies  Review of Systems  Constitutional:  Negative for chills, fever and malaise/fatigue.  HENT:         See HPI  Eyes: Negative.   Respiratory:  Positive for hemoptysis. Negative for cough and shortness of breath.   Cardiovascular:  Negative for chest pain, palpitations and leg swelling.  Gastrointestinal:  Negative for abdominal pain, blood in stool, constipation, diarrhea, nausea and vomiting.  Genitourinary:  Negative for dysuria, flank pain, frequency, hematuria and urgency.       See HPI  Musculoskeletal: Negative.   Skin:  Positive for rash.  Neurological: Negative.   Psychiatric/Behavioral:  Negative for depression. The patient is not nervous/anxious.   All other systems reviewed and are negative.     Objective:    Physical Exam Vitals reviewed.  Constitutional:      General: He is not in acute  distress.    Appearance: Normal appearance.  HENT:     Head: Normocephalic.     Mouth/Throat:     Lips: Pink. No lesions.     Mouth: Mucous membranes are moist.     Dentition: Normal dentition. No dental tenderness, dental caries, dental abscesses or gum lesions.     Tongue: No lesions. Tongue does not deviate from midline.     Pharynx: Oropharynx is clear. Uvula midline. No pharyngeal swelling, oropharyngeal exudate, posterior oropharyngeal erythema or uvula swelling.     Tonsils: No tonsillar exudate or tonsillar abscesses.  Cardiovascular:     Rate and Rhythm: Normal rate and regular rhythm.     Pulses: Normal pulses.     Heart sounds:  Normal heart sounds.     Comments: No obvious peripheral edema Pulmonary:     Effort: Pulmonary effort is normal.     Breath sounds: Normal breath sounds.  Genitourinary:    Penis: Circumcised. Lesions present. No erythema, tenderness, discharge or swelling.      Comments: Less than 0.5 cm lesion noted penile opening Musculoskeletal:     Cervical back: Normal range of motion and neck supple.  Skin:    General: Skin is warm and dry.     Capillary Refill: Capillary refill takes less than 2 seconds.     Findings: Rash present.     Comments: 3-4 cm in circumference maculopapular rash noted to right upper back. No drainage or erythema noted. Area is non tender to palpation.   Neurological:     General: No focal deficit present.     Mental Status: He is alert and oriented to person, place, and time.  Psychiatric:        Mood and Affect: Mood normal.        Behavior: Behavior normal.        Thought Content: Thought content normal.        Judgment: Judgment normal.    BP 129/89   Pulse 72   Temp 98.2 F (36.8 C)   Ht 6' (1.829 m)   Wt 185 lb (83.9 kg)   SpO2 100%   BMI 25.09 kg/m  Wt Readings from Last 3 Encounters:  10/06/21 185 lb (83.9 kg)  06/30/21 189 lb 0.6 oz (85.7 kg)  06/11/21 190 lb (86.2 kg)    Immunization History   Administered Date(s) Administered   PFIZER(Purple Top)SARS-COV-2 Vaccination 05/20/2020, 11/17/2020    Diabetic Foot Exam - Simple   No data filed     Lab Results  Component Value Date   TSH 0.820 06/30/2021   Lab Results  Component Value Date   WBC 5.1 06/30/2021   HGB 16.9 06/30/2021   HCT 55.5 (H) 06/30/2021   MCV 73 (L) 06/30/2021   PLT 255 06/30/2021   Lab Results  Component Value Date   NA 142 06/30/2021   K 4.7 06/30/2021   CO2 26 09/30/2020   GLUCOSE 111 (H) 06/30/2021   BUN 14 06/30/2021   CREATININE 0.89 06/30/2021   BILITOT 0.4 06/30/2021   ALKPHOS 55 06/30/2021   AST 23 06/30/2021   ALT 64 (H) 09/30/2020   PROT 6.0 06/30/2021   ALBUMIN 4.1 06/30/2021   CALCIUM 9.8 06/30/2021   ANIONGAP 7 10/13/2019   EGFR 105 06/30/2021   Lab Results  Component Value Date   CHOL 104 06/30/2021   CHOL 113 09/30/2020   CHOL 108 11/18/2019   Lab Results  Component Value Date   HDL 42 06/30/2021   HDL 48 09/30/2020   HDL 45.10 11/18/2019   Lab Results  Component Value Date   LDLCALC 49 06/30/2021   LDLCALC 54 09/30/2020   LDLCALC 46 11/18/2019   Lab Results  Component Value Date   TRIG 59 06/30/2021   TRIG 45 09/30/2020   TRIG 83.0 11/18/2019   Lab Results  Component Value Date   CHOLHDL 2.5 06/30/2021   CHOLHDL 2.4 09/30/2020   CHOLHDL 2 11/18/2019   Lab Results  Component Value Date   HGBA1C 5.7 (H) 05/22/2019       Assessment & Plan:   Problem List Items Addressed This Visit   None Visit Diagnoses     Penile pain    -  Primary  Relevant Orders   POCT URINALYSIS DIP (CLIN[facility]K) (Completed)   GC/Chlamydia Probe Amp([facility])   Rash     Patient informed to begin applying previously prescribed topical steroid to rash located in back. No suspicion for infection at this time.   Abrasion of penis, initial encounter       Relevant Medications   bacitracin 500 UNIT/GM ointment Patient informed to apply bacitracin to affected area. Notify  clinic is pain worsens or there is penile discharge   Bloody sputum       Relevant Orders   Ambulatory referral to Dentistry Likely related to acute trauma tongue while patient sleeping, exam reassuring. Recommend further evaluation of symptoms by dentistry. Referral completed.  Informed to notify clinic is bleeding increases in our amount or other symptoms develop.   Tongue abnormality       Relevant Orders   Ambulatory referral to Dentistry   Follow up in 2 wks for reevaluation of symptoms, sooner as needed    I am having Brandon Summers start on bacitracin. I am also having him maintain his albuterol, topiramate, busPIRone, Vitamin D, aspirin EC, gabapentin, triamcinolone ointment, and atorvastatin.  Meds ordered this encounter  Medications   bacitracin 500 UNIT/GM ointment    Sig: Apply 1 application topically 2 (two) times daily.    Dispense:  15 g    Refill:  0     Teena Dunk, NP

## 2021-10-09 LAB — GC/CHLAMYDIA PROBE AMP
Chlamydia trachomatis, NAA: NEGATIVE
Neisseria Gonorrhoeae by PCR: NEGATIVE

## 2021-10-18 ENCOUNTER — Ambulatory Visit (INDEPENDENT_AMBULATORY_CARE_PROVIDER_SITE_OTHER): Payer: Medicaid Other

## 2021-10-18 DIAGNOSIS — I639 Cerebral infarction, unspecified: Secondary | ICD-10-CM

## 2021-10-19 LAB — CUP PACEART REMOTE DEVICE CHECK
Date Time Interrogation Session: 20221120230701
Implantable Pulse Generator Implant Date: 20200828

## 2021-10-20 ENCOUNTER — Encounter: Payer: Self-pay | Admitting: Nurse Practitioner

## 2021-10-20 ENCOUNTER — Other Ambulatory Visit: Payer: Self-pay

## 2021-10-20 ENCOUNTER — Ambulatory Visit: Payer: Medicaid Other | Admitting: Nurse Practitioner

## 2021-10-20 ENCOUNTER — Ambulatory Visit (INDEPENDENT_AMBULATORY_CARE_PROVIDER_SITE_OTHER): Payer: Medicaid Other | Admitting: Nurse Practitioner

## 2021-10-20 VITALS — BP 120/78 | HR 74 | Temp 97.2°F | Wt 187.5 lb

## 2021-10-20 DIAGNOSIS — Z8673 Personal history of transient ischemic attack (TIA), and cerebral infarction without residual deficits: Secondary | ICD-10-CM | POA: Diagnosis not present

## 2021-10-20 DIAGNOSIS — E7849 Other hyperlipidemia: Secondary | ICD-10-CM

## 2021-10-20 DIAGNOSIS — Z0000 Encounter for general adult medical examination without abnormal findings: Secondary | ICD-10-CM | POA: Diagnosis not present

## 2021-10-20 DIAGNOSIS — R531 Weakness: Secondary | ICD-10-CM | POA: Diagnosis not present

## 2021-10-20 DIAGNOSIS — E119 Type 2 diabetes mellitus without complications: Secondary | ICD-10-CM

## 2021-10-20 LAB — POCT URINALYSIS DIP (CLINITEK)
Bilirubin, UA: NEGATIVE
Blood, UA: NEGATIVE
Glucose, UA: NEGATIVE mg/dL
Ketones, POC UA: NEGATIVE mg/dL
Leukocytes, UA: NEGATIVE
Nitrite, UA: NEGATIVE
POC PROTEIN,UA: NEGATIVE
Spec Grav, UA: 1.02 (ref 1.010–1.025)
Urobilinogen, UA: 0.2 E.U./dL
pH, UA: 6.5 (ref 5.0–8.0)

## 2021-10-20 LAB — POCT GLYCOSYLATED HEMOGLOBIN (HGB A1C)
HbA1c POC (<> result, manual entry): 6.7 % (ref 4.0–5.6)
HbA1c, POC (controlled diabetic range): 6.7 % (ref 0.0–7.0)
HbA1c, POC (prediabetic range): 6.7 % — AB (ref 5.7–6.4)
Hemoglobin A1C: 6.7 % — AB (ref 4.0–5.6)

## 2021-10-20 MED ORDER — BLOOD GLUCOSE MONITOR KIT: PACK | 0 refills | Status: AC

## 2021-10-20 NOTE — Patient Instructions (Addendum)
Health Maintenance, Male Adopting a healthy lifestyle and getting preventive care are important in promoting health and wellness. Ask your health care provider about: The right schedule for you to have regular tests and exams. Things you can do on your own to prevent diseases and keep yourself healthy. What should I know about diet, weight, and exercise? Eat a healthy diet  Eat a diet that includes plenty of vegetables, fruits, low-fat dairy products, and lean protein. Do not eat a lot of foods that are high in solid fats, added sugars, or sodium. Maintain a healthy weight Body mass index (BMI) is a measurement that can be used to identify possible weight problems. It estimates body fat based on height and weight. Your health care provider can help determine your BMI and help you achieve or maintain a healthy weight. Get regular exercise Get regular exercise. This is one of the most important things you can do for your health. Most adults should: Exercise for at least 150 minutes each week. The exercise should increase your heart rate and make you sweat (moderate-intensity exercise). Do strengthening exercises at least twice a week. This is in addition to the moderate-intensity exercise. Spend less time sitting. Even light physical activity can be beneficial. Watch cholesterol and blood lipids Have your blood tested for lipids and cholesterol at 49 years of age, then have this test every 5 years. You may need to have your cholesterol levels checked more often if: Your lipid or cholesterol levels are high. You are older than 49 years of age. You are at high risk for heart disease. What should I know about cancer screening? Many types of cancers can be detected early and may often be prevented. Depending on your health history and family history, you may need to have cancer screening at various ages. This may include screening for: Colorectal cancer. Prostate cancer. Skin cancer. Lung  cancer. What should I know about heart disease, diabetes, and high blood pressure? Blood pressure and heart disease High blood pressure causes heart disease and increases the risk of stroke. This is more likely to develop in people who have high blood pressure readings or are overweight. Talk with your health care provider about your target blood pressure readings. Have your blood pressure checked: Every 3-5 years if you are 18-39 years of age. Every year if you are 40 years old or older. If you are between the ages of 65 and 75 and are a current or former smoker, ask your health care provider if you should have a one-time screening for abdominal aortic aneurysm (AAA). Diabetes Have regular diabetes screenings. This checks your fasting blood sugar level. Have the screening done: Once every three years after age 45 if you are at a normal weight and have a low risk for diabetes. More often and at a younger age if you are overweight or have a high risk for diabetes. What should I know about preventing infection? Hepatitis B If you have a higher risk for hepatitis B, you should be screened for this virus. Talk with your health care provider to find out if you are at risk for hepatitis B infection. Hepatitis C Blood testing is recommended for: Everyone born from 1945 through 1965. Anyone with known risk factors for hepatitis C. Sexually transmitted infections (STIs) You should be screened each year for STIs, including gonorrhea and chlamydia, if: You are sexually active and are younger than 49 years of age. You are older than 49 years of age and your   health care provider tells you that you are at risk for this type of infection. Your sexual activity has changed since you were last screened, and you are at increased risk for chlamydia or gonorrhea. Ask your health care provider if you are at risk. Ask your health care provider about whether you are at high risk for HIV. Your health care provider  may recommend a prescription medicine to help prevent HIV infection. If you choose to take medicine to prevent HIV, you should first get tested for HIV. You should then be tested every 3 months for as long as you are taking the medicine. Follow these instructions at home: Alcohol use Do not drink alcohol if your health care provider tells you not to drink. If you drink alcohol: Limit how much you have to 0-2 drinks a day. Know how much alcohol is in your drink. In the [place] one drink equals one 12 oz bottle of beer (355 mL), one 5 oz glass of wine (148 mL), or one 1 oz glass of hard liquor (44 mL). Lifestyle Do not use any products that contain nicotine or tobacco. These products include cigarettes, chewing tobacco, and vaping devices, such as e-cigarettes. If you need help quitting, ask your health care provider. Do not use street drugs. Do not share needles. Ask your health care provider for help if you need support or information about quitting drugs. General instructions Schedule regular health, dental, and eye exams. Stay current with your vaccines. Tell your health care provider if: You often feel depressed. You have ever been abused or do not feel safe at home. Summary Adopting a healthy lifestyle and getting preventive care are important in promoting health and wellness. Follow your health care provider's instructions about healthy diet, exercising, and getting tested or screened for diseases. Follow your health care provider's instructions on monitoring your cholesterol and blood pressure. This information is not intended to replace advice given to you by your health care provider. Make sure you discuss any questions you have with your health care provider. Document Revised: 04/05/2021 Document Reviewed: 04/05/2021 Elsevier Patient Education  2022 Elsevier Inc[employer]  Diabetes Mellitus Basics Diabetes mellitus, or diabetes, is a long-term (chronic) disease. It occurs when the body  does not properly use sugar (glucose) that is released from food after you eat. Diabetes mellitus may be caused by one or both of these problems: Your pancreas does not make enough of a hormone called insulin. Your body does not react in a normal way to the insulin that it makes. Insulin lets glucose enter cells in your body. This gives you energy. If you have diabetes, glucose cannot get into cells. This causes high blood glucose (hyperglycemia). How to treat and manage diabetes You may need to take insulin or other diabetes medicines daily to keep your glucose in balance. If you are prescribed insulin, you will learn how to give yourself insulin by injection. You may need to adjust the amount of insulin you take based on the foods that you eat. You will need to check your blood glucose levels using a glucose monitor as told by your health care provider. The readings can help determine if you have low or high blood glucose. Generally, you should have these blood glucose levels: Before meals (preprandial): 80-130 mg/dL (6.2-7.0 mmol/L). After meals (postprandial): below 180 mg/dL (10 mmol/L). Hemoglobin A1c (HbA1c) level: less than 7%. Your health care provider will set treatment goals for you. Keep all follow-up visits. This is important. Follow these  instructions at home: Diabetes medicines Take your diabetes medicines every day as told by your health care provider. List your diabetes medicines here: Name of medicine: ______________________________ Amount (dose): _______________ Time (a.m./p.m.): _______________ Notes: ___________________________________ Name of medicine: ______________________________ Amount (dose): _______________ Time (a.m./p.m.): _______________ Notes: ___________________________________ Name of medicine: ______________________________ Amount (dose): _______________ Time (a.m./p.m.): _______________ Notes: ___________________________________ Insulin If you use insulin,  list the types of insulin you use here: Insulin type: ______________________________ Amount (dose): _______________ Time (a.m./p.m.): _______________Notes: ___________________________________ Insulin type: ______________________________ Amount (dose): _______________ Time (a.m./p.m.): _______________ Notes: ___________________________________ Insulin type: ______________________________ Amount (dose): _______________ Time (a.m./p.m.): _______________ Notes: ___________________________________ Insulin type: ______________________________ Amount (dose): _______________ Time (a.m./p.m.): _______________ Notes: ___________________________________ Insulin type: ______________________________ Amount (dose): _______________ Time (a.m./p.m.): _______________ Notes: ___________________________________ Managing blood glucose Check your blood glucose levels using a glucose monitor as told by your health care provider. Write down the times that you check your glucose levels here: Time: _______________ Notes: ___________________________________ Time: _______________ Notes: ___________________________________ Time: _______________ Notes: ___________________________________ Time: _______________ Notes: ___________________________________ Time: _______________ Notes: ___________________________________ Time: _______________ Notes: ___________________________________  Low blood glucose Low blood glucose (hypoglycemia) is when glucose is at or below 70 mg/dL (3.9 mmol/L). Symptoms may include: Feeling: Hungry. Sweaty and clammy. Irritable or easily upset. Dizzy. Sleepy. Having: A fast heartbeat. A headache. A change in your vision. Numbness around the mouth, lips, or tongue. Having trouble with: Moving (coordination). Sleeping. Treating low blood glucose To treat low blood glucose, eat or drink something containing sugar right away. If you can think clearly and swallow safely, follow the 15:15  rule: Take 15 grams of a fast-acting carb (carbohydrate), as told by your health care provider. Some fast-acting carbs are: Glucose tablets: take 3-4 tablets. Hard candy: eat 3-5 pieces. Fruit juice: drink 4 oz (120 mL). Regular (not diet) soda: drink 4-6 oz (120-180 mL). Honey or sugar: eat 1 Tbsp (15 mL). Check your blood glucose levels 15 minutes after you take the carb. If your glucose is still at or below 70 mg/dL (3.9 mmol/L), take 15 grams of a carb again. If your glucose does not go above 70 mg/dL (3.9 mmol/L) after 3 tries, get help right away. After your glucose goes back to normal, eat a meal or a snack within 1 hour. Treating very low blood glucose If your glucose is at or below 54 mg/dL (3 mmol/L), you have very low blood glucose (severe hypoglycemia). This is an emergency. Do not wait to see if the symptoms will go away. Get medical help right away. Call your local emergency services (911 in the [place] Do not drive yourself to the hospital. Questions to ask your health care provider Should I talk with a diabetes educator? What equipment will I need to care for myself at home? What diabetes medicines do I need? When should I take them? How often do I need to check my blood glucose levels? What number can I call if I have questions? When is my follow-up visit? Where can I find a support group for people with diabetes? Where to find more information American Diabetes Association: www.diabetes.org Association of Diabetes Care and Education Specialists: www.diabeteseducator.org Contact a health care provider if: Your blood glucose is at or above 240 mg/dL (51.7 mmol/L) for 2 days in a row. You have been sick or have had a fever for 2 days or more, and you are not getting better. You have any of these problems for more than 6 hours: You cannot eat or drink. You feel nauseous. You vomit. You  have diarrhea. Get help right away if: Your blood glucose is lower than 54 mg/dL (3  mmol/L). You get confused. You have trouble thinking clearly. You have trouble breathing. These symptoms may represent a serious problem that is an emergency. Do not wait to see if the symptoms will go away. Get medical help right away. Call your local emergency services (911 in the [place] Do not drive yourself to the hospital. Summary Diabetes mellitus is a chronic disease that occurs when the body does not properly use sugar (glucose) that is released from food after you eat. Take insulin and diabetes medicines as told. Check your blood glucose every day, as often as told. Keep all follow-up visits. This is important. This information is not intended to replace advice given to you by your health care provider. Make sure you discuss any questions you have with your health care provider. Document Revised: 03/17/2020 Document Reviewed: 03/17/2020 Elsevier Patient Education  2022 [employer].

## 2021-10-20 NOTE — Progress Notes (Signed)
[place], [place]  [address] Phone:  [phone]   Fax:  [phone]   Established Patient Office Visit  Subjective:  Patient ID: Brandon Summers, male    DOB: 1971/03/15  Age: 50 y.o. MRN: [id number]  CC:  Chief Complaint  Patient presents with   Follow-up    Pt has no concerns or issues at this time.    HPI Brandon Summers presents for follow up. 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   He is in today for a follow up. He had an elevated A1c in 2020. He does try to eat healthy. He does like potatoes, corn and some bread. He reports that certain foods dose cause him to get dizzy so he avoids. He had a stroke a few years ago. He is currently treated for HLD.  He is going to travel to [place] in February.   Denies headache, dizziness, visual changes, shortness of breath, dyspnea on exertion, chest pain, nausea, vomiting or any edema.    Past Medical History:  Diagnosis Date   Cough    CVA (cerebral vascular accident) 05/21/2019   Left thalamic stroke   HLD (hyperlipidemia) 05/23/2019   Mild vascular neurocognitive disorder 05/12/2020   Nonintractable headache 04/15/2020   Pacemaker    Right sided weakness 09/09/2019   Shortness of breath    Visual problems    Vitamin D deficiency 11/2019    Past Surgical History:  Procedure Laterality Date   BUBBLE STUDY  05/23/2019   Procedure: BUBBLE STUDY;  Surgeon: Pixie Casino, MD;  Location: [facility] ENDOSCOPY;  Service: Cardiovascular;;   implantable loop recorder placement  07/26/2019   MDT Reveal [id number] implanted by Dr Rayann Heman for cryptogenic stroke   TEE WITHOUT CARDIOVERSION N/A 05/23/2019   Procedure:  TRANSESOPHAGEAL ECHOCARDIOGRAM (TEE);  Surgeon: Pixie Casino, MD;  Location: [facility] ENDOSCOPY;  Service: Cardiovascular;  Laterality: N/A;    Family History  Problem Relation Age of Onset   Hypertension Mother    Stroke Mother    Colon cancer Neg Hx    Esophageal cancer Neg Hx    Pancreatic cancer Neg Hx    Stomach cancer Neg Hx     Social History   Socioeconomic History   Marital status: Married    Spouse name: Not on file   Number of children: Not on file   Years of education: 12   Highest education level: High school graduate  Occupational History   Occupation: Chef  Tobacco Use   Smoking status: Former    Types: Cigarettes    Quit date: 05/21/2019    Years since quitting: 2.4   Smokeless tobacco: Never  Vaping Use   Vaping Use: Never used  Substance and Sexual Activity   Alcohol use: Not Currently   Drug use: No   Sexual activity: Not Currently  Other Topics Concern   Not on file  Social History Narrative   Marital status: separated; moved from [place] to [place] 1998   Children: 2 children; no grandchildren.   Employment: chef   Tobacco: 1/2ppd   Alcohol: none   Drugs: none   Exercise: none.    no Caffeine   Lives in a condo in a one story floor plan   Social Determinants of Health  Financial Resource Strain: Not on file  Food Insecurity: Not on file  Transportation Needs: Not on file  Physical Activity: Not on file  Stress: Not on file  Social Connections: Not on file  Intimate Partner Violence: Not on file    Outpatient Medications Prior to Visit  Medication Sig Dispense Refill   albuterol (VENTOLIN HFA) 108 (90 Base) MCG/ACT inhaler Inhale 2 puffs into the lungs every 6 (six) hours as needed for wheezing or shortness of breath. 8 g 11   aspirin EC 81 MG tablet Take 81 mg by mouth daily. Swallow whole.     atorvastatin (LIPITOR) 40 MG tablet TAKE 1 TABLET BY MOUTH DAILY AT 6 PM. 90 tablet 0   bacitracin 500 UNIT/GM ointment Apply 1 application  topically 2 (two) times daily. 15 g 0   Cholecalciferol (VITAMIN D) 50 MCG (2000 UT) CAPS Take 1 capsule by mouth daily.     gabapentin (NEURONTIN) 300 MG capsule Take 1 capsule (300 mg total) by mouth 2 (two) times daily. 60 capsule 6   triamcinolone ointment (KENALOG) 0.5 % APPLY TO AFFECTED AREA TWICE A DAY 30 g 0   busPIRone (BUSPAR) 10 MG tablet Take 10 mg by mouth daily.     topiramate (TOPAMAX) 50 MG tablet Take 1 tablet (50 mg total) by mouth 2 (two) times daily. 60 tablet 6   No facility-administered medications prior to visit.    No Known Allergies  ROS Review of Systems    Objective:    Physical Exam Constitutional:      General: He is not in acute distress.    Appearance: He is normal weight. He is not ill-appearing, toxic-appearing or diaphoretic.  HENT:     Head: Normocephalic and atraumatic.     Nose: Nose normal.  Cardiovascular:     Rate and Rhythm: Normal rate and regular rhythm.     Pulses: Normal pulses.     Heart sounds: Normal heart sounds.  Pulmonary:     Effort: Pulmonary effort is normal.     Breath sounds: Normal breath sounds.  Abdominal:     Palpations: Abdomen is soft.  Musculoskeletal:        General: Normal range of motion.     Cervical back: Normal range of motion.  Skin:    General: Skin is warm.     Capillary Refill: Capillary refill takes less than 2 seconds.     Comments: Dark rash to right shin   Neurological:     General: No focal deficit present.     Mental Status: He is alert and oriented to person, place, and time.  Psychiatric:        Mood and Affect: Mood normal.        Behavior: Behavior normal.        Thought Content: Thought content normal.        Judgment: Judgment normal.    BP 120/78   Pulse 74   Temp (!) 97.2 F (36.2 C)   Wt 187 lb 8 oz (85 kg)   SpO2 100%   BMI 25.43 kg/m  Wt Readings from Last 3 Encounters:  10/20/21 187 lb 8 oz (85 kg)  10/06/21 185 lb (83.9 kg)  06/30/21 189 lb 0.6 oz (85.7 kg)      Health Maintenance Due  Topic Date Due   URINE MICROALBUMIN  Never done    There are no preventive care reminders to display for this patient.  Lab Results  Component Value  Date   TSH 0.820 06/30/2021   Lab Results  Component Value Date   WBC 5.1 06/30/2021   HGB 16.9 06/30/2021   HCT 55.5 (H) 06/30/2021   MCV 73 (L) 06/30/2021   PLT 255 06/30/2021   Lab Results  Component Value Date   NA 142 06/30/2021   K 4.7 06/30/2021   CO2 26 09/30/2020   GLUCOSE 111 (H) 06/30/2021   BUN 14 06/30/2021   CREATININE 0.89 06/30/2021   BILITOT 0.4 06/30/2021   ALKPHOS 55 06/30/2021   AST 23 06/30/2021   ALT 64 (H) 09/30/2020   PROT 6.0 06/30/2021   ALBUMIN 4.1 06/30/2021   CALCIUM 9.8 06/30/2021   ANIONGAP 7 10/13/2019   EGFR 105 06/30/2021   Lab Results  Component Value Date   CHOL 104 06/30/2021   Lab Results  Component Value Date   HDL 42 06/30/2021   Lab Results  Component Value Date   LDLCALC 49 06/30/2021   Lab Results  Component Value Date   TRIG 59 06/30/2021   Lab Results  Component Value Date   CHOLHDL 2.5 06/30/2021   Lab Results  Component Value Date   HGBA1C 6.7 (A) 10/20/2021   HGBA1C 6.7 10/20/2021   HGBA1C 6.7 (A) 10/20/2021   HGBA1C 6.7 10/20/2021      Assessment & Plan:   Problem List Items Addressed This Visit       Nervous and Auditory   Right sided weakness Stable      Other   HLD (hyperlipidemia) Stable  Continue with current regimen.  No changes warranted. Good patient compliance.    Other Visit Diagnoses     Healthcare maintenance    -  Primary   Relevant Orders   HgB A1c (Completed)   History of CVA (cerebrovascular accident)       Diabetes mellitus type 2, diet-controlled ([place])     New onset hx of Prediabetes  Encourage regular CBG monitoring Encourage contacting office if excessive hyperglycemia and or hypoglycemia Lifestyle modification with healthy diet (fewer calories, more high fiber foods, whole grains  and non-starchy vegetables, lower fat meat and fish, low-fat diary include healthy oils) regular exercise (physical activity)  Opthalmology exam discussed  Nutritional consult recommended Regular dental visits encouraged Home BP monitoring also encouraged goal <130/80    Relevant Orders   Microalbumin, urine       Meds ordered this encounter  Medications   blood glucose meter kit and supplies KIT    Sig: Dispense based on patient and insurance preference. Use up to four times daily as directed.    Dispense:  1 each    Refill:  0    Order Specific Question:   Supervising Provider    Answer:   Tresa Garter [id number]    Order Specific Question:   Number of strips    Answer:   100    Order Specific Question:   Number of lancets    Answer:   100     Follow-up: Return for follow up [id number].    Vevelyn Francois, NP

## 2021-10-21 LAB — MICROALBUMIN, URINE: Microalbumin, Urine: 3.8 ug/mL

## 2021-10-27 NOTE — Progress Notes (Signed)
Carelink Summary Report / Loop Recorder 

## 2021-11-16 ENCOUNTER — Other Ambulatory Visit: Payer: Self-pay | Admitting: Nurse Practitioner

## 2021-11-16 DIAGNOSIS — E785 Hyperlipidemia, unspecified: Secondary | ICD-10-CM

## 2021-11-19 ENCOUNTER — Ambulatory Visit (INDEPENDENT_AMBULATORY_CARE_PROVIDER_SITE_OTHER): Payer: Medicare Other

## 2021-11-19 DIAGNOSIS — I639 Cerebral infarction, unspecified: Secondary | ICD-10-CM

## 2021-11-22 LAB — CUP PACEART REMOTE DEVICE CHECK
Date Time Interrogation Session: 20221223230514
Implantable Pulse Generator Implant Date: 20200828

## 2021-11-30 NOTE — Progress Notes (Signed)
Carelink Summary Report / Loop Recorder 

## 2021-12-16 ENCOUNTER — Other Ambulatory Visit (INDEPENDENT_AMBULATORY_CARE_PROVIDER_SITE_OTHER): Payer: Medicare Other

## 2021-12-16 ENCOUNTER — Other Ambulatory Visit: Payer: Self-pay

## 2021-12-16 DIAGNOSIS — E785 Hyperlipidemia, unspecified: Secondary | ICD-10-CM | POA: Diagnosis not present

## 2021-12-16 DIAGNOSIS — R531 Weakness: Secondary | ICD-10-CM

## 2021-12-16 DIAGNOSIS — E119 Type 2 diabetes mellitus without complications: Secondary | ICD-10-CM

## 2021-12-16 DIAGNOSIS — M79604 Pain in right leg: Secondary | ICD-10-CM

## 2021-12-16 DIAGNOSIS — Z760 Encounter for issue of repeat prescription: Secondary | ICD-10-CM

## 2021-12-16 MED ORDER — ATORVASTATIN CALCIUM 40 MG PO TABS: 40.0000 mg | ORAL_TABLET | Freq: Every day | ORAL | 1 refills | Status: DC

## 2021-12-16 MED ORDER — GABAPENTIN 300 MG PO CAPS: 300.0000 mg | ORAL_CAPSULE | Freq: Two times a day (BID) | ORAL | 1 refills | Status: DC

## 2021-12-16 NOTE — Progress Notes (Signed)
° °  [place], [place]  [address] Phone:  [phone]   Fax:  [phone]  Medication Management Clinic Visit Note  Patient: Brandon Summers MRN: [id number] Date of Birth: 1971-04-25 PCP: Vevelyn Francois, NP   Erlene Senters 51 y.o. male presents for a nurse visit today. He is planning a trip to [place] for his daughter wedding. He is was recently diagnosed with diabetes. He has a home monitor and is in today to have his CBG done. He reports this being completed at home but does not have documentation today or his meter. Denies fever, chills, headache, dizziness, visual changes, polydipsia,  polyphagia shortness of breath, dyspnea on exertion, chest pain, nausea, vomiting, polyuria, constipation, diarrhea, or any edema.   He is also requesting refill on his atorvastatin and gabapentin.   There were no vitals taken for this visit.  Patient Information In office CBG 104   Past Medical History:  Diagnosis Date   Cough    CVA (cerebral vascular accident) 05/21/2019   Left thalamic stroke   HLD (hyperlipidemia) 05/23/2019   Mild vascular neurocognitive disorder 05/12/2020   Nonintractable headache 04/15/2020   Pacemaker    Right sided weakness 09/09/2019   Shortness of breath    Visual problems    Vitamin D deficiency 11/2019      Past Surgical History:  Procedure Laterality Date   BUBBLE STUDY  05/23/2019   Procedure: BUBBLE STUDY;  Surgeon: Pixie Casino, MD;  Location: [facility] ENDOSCOPY;  Service: Cardiovascular;;   implantable loop recorder placement  07/26/2019   MDT Reveal [id number] implanted by Dr Rayann Heman for cryptogenic stroke   TEE WITHOUT CARDIOVERSION N/A 05/23/2019   Procedure: TRANSESOPHAGEAL ECHOCARDIOGRAM (TEE);  Surgeon: Pixie Casino, MD;  Location: [facility] ENDOSCOPY;  Service: Cardiovascular;  Laterality: N/A;     Family History  Problem Relation Age of Onset   Hypertension Mother    Stroke Mother    Colon cancer Neg Hx     Esophageal cancer Neg Hx    Pancreatic cancer Neg Hx    Stomach cancer Neg Hx     New Diagnoses (since last visit): Diabetes          Social History   Substance and Sexual Activity  Alcohol Use Not Currently      Social History   Tobacco Use  Smoking Status Former   Types: Cigarettes   Quit date: 05/21/2019   Years since quitting: 2.5  Smokeless Tobacco Never      Health Maintenance  Topic Date Due   Zoster Vaccines- Shingrix (1 of 2) Never done   COVID-19 Vaccine (3 - Booster for Pfizer series) 01/12/2021   INFLUENZA VACCINE  02/25/2022 (Originally 06/28/2021)   Pneumococcal Vaccine 40-69 Years old (1 - PCV) 10/20/2022 (Originally 11/28/1977)   COLONOSCOPY (Pts 45-23yrs Insurance coverage will need to be confirmed)  10/20/2022 (Originally 11/28/2016)   TETANUS/TDAP  10/20/2022 (Originally 11/28/1990)   URINE MICROALBUMIN  10/20/2022   Hepatitis C Screening  Completed   HIV Screening  Completed   HPV VACCINES  Aged Out     Assessment and Plan:  Patient education provided on diet.  Medication atorvastatin 40 mg daily and gabapentin 300 mg BID refilled.

## 2021-12-27 ENCOUNTER — Ambulatory Visit (INDEPENDENT_AMBULATORY_CARE_PROVIDER_SITE_OTHER): Payer: Medicaid Other

## 2021-12-27 DIAGNOSIS — I639 Cerebral infarction, unspecified: Secondary | ICD-10-CM

## 2021-12-27 LAB — CUP PACEART REMOTE DEVICE CHECK
Date Time Interrogation Session: 20230129230855
Implantable Pulse Generator Implant Date: 20200828

## 2021-12-31 ENCOUNTER — Ambulatory Visit: Payer: Medicaid Other | Admitting: Nurse Practitioner

## 2022-01-04 NOTE — Progress Notes (Signed)
Carelink Summary Report / Loop Recorder 

## 2022-01-19 ENCOUNTER — Ambulatory Visit: Payer: Medicaid Other | Admitting: Nurse Practitioner

## 2022-01-31 ENCOUNTER — Ambulatory Visit (INDEPENDENT_AMBULATORY_CARE_PROVIDER_SITE_OTHER): Payer: Medicare Other

## 2022-01-31 DIAGNOSIS — I639 Cerebral infarction, unspecified: Secondary | ICD-10-CM | POA: Diagnosis not present

## 2022-02-01 LAB — CUP PACEART REMOTE DEVICE CHECK
Date Time Interrogation Session: 20230303230333
Implantable Pulse Generator Implant Date: 20200828

## 2022-02-10 ENCOUNTER — Ambulatory Visit (INDEPENDENT_AMBULATORY_CARE_PROVIDER_SITE_OTHER): Payer: Medicare Other | Admitting: Nurse Practitioner

## 2022-02-10 ENCOUNTER — Encounter: Payer: Self-pay | Admitting: Nurse Practitioner

## 2022-02-10 ENCOUNTER — Other Ambulatory Visit: Payer: Self-pay

## 2022-02-10 VITALS — BP 111/85 | HR 69 | Temp 97.3°F | Ht 72.0 in | Wt 174.2 lb

## 2022-02-10 DIAGNOSIS — E785 Hyperlipidemia, unspecified: Secondary | ICD-10-CM

## 2022-02-10 DIAGNOSIS — Z8673 Personal history of transient ischemic attack (TIA), and cerebral infarction without residual deficits: Secondary | ICD-10-CM

## 2022-02-10 DIAGNOSIS — E119 Type 2 diabetes mellitus without complications: Secondary | ICD-10-CM

## 2022-02-10 DIAGNOSIS — R531 Weakness: Secondary | ICD-10-CM | POA: Diagnosis not present

## 2022-02-10 LAB — POCT GLYCOSYLATED HEMOGLOBIN (HGB A1C)
HbA1c POC (<> result, manual entry): 6.4 % (ref 4.0–5.6)
HbA1c, POC (controlled diabetic range): 6.4 % (ref 0.0–7.0)
HbA1c, POC (prediabetic range): 6.4 % (ref 5.7–6.4)
Hemoglobin A1C: 6.4 % — AB (ref 4.0–5.6)

## 2022-02-10 MED ORDER — ATORVASTATIN CALCIUM 40 MG PO TABS: 40.0000 mg | ORAL_TABLET | Freq: Every day | ORAL | 1 refills | Status: DC

## 2022-02-10 MED ORDER — SCOPOLAMINE 1 MG/3DAYS TD PT72: 1.0000 | MEDICATED_PATCH | TRANSDERMAL | 1 refills | Status: DC

## 2022-02-10 NOTE — Progress Notes (Signed)
? ?[place] ?[place], [place]  [address] ?Phone:  [phone]   Fax:  [phone] ? ? ?Established Patient Office Visit ? ?Subjective:  ?Patient ID: Brandon Summers, male    DOB: 12-06-1970  Age: 51 y.o. MRN: [id number] ? ?CC:  ?Chief Complaint  ?Patient presents with  ? Follow-up  ?  Patient is here today for his follow up visit and to discuss developing more saliva in his mouth then before. Patient also states that he rarely checks his sugar levels at home due to being afraid to prick his fingers.  ? ? ?HPI ?Brandon Summers presents for follow up.  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  ? ?Brandon Summers is in today for diabetes follow up. He is diet controlled statin therapy atorvastatin . The reported use of treatment is  consistent with prescribed. There are no reported side effects from the treatment. Home glucose monitoring is not done at home.  ?Denies fever, chills, headache, dizziness, visual changes, polydipsia,  polyphagia shortness of breath, dyspnea on exertion, chest pain, abdominal pain, nausea, vomiting, polyuria, constipation, diarrhea,  any edema, numbness, tingling, burning of hand or feet.  ? ? ? ?Past Medical History:  ?Diagnosis Date  ? Cough   ? CVA (cerebral vascular accident) 05/21/2019  ? Left thalamic stroke  ? HLD (hyperlipidemia) 05/23/2019  ? Mild vascular neurocognitive disorder 05/12/2020  ? Nonintractable headache 04/15/2020  ? Pacemaker   ? Right sided weakness 09/09/2019  ? Shortness of breath   ? Visual problems   ? Vitamin D deficiency 11/2019  ? ? ?Past Surgical History:  ?Procedure Laterality Date  ? BUBBLE STUDY  05/23/2019  ? Procedure: BUBBLE STUDY;  Surgeon: Pixie Casino, MD;  Location: [facility] ENDOSCOPY;  Service: Cardiovascular;;  ? implantable loop recorder placement  07/26/2019  ? MDT Reveal [id number] implanted by Dr Rayann Heman for cryptogenic stroke  ? TEE WITHOUT CARDIOVERSION N/A 05/23/2019  ? Procedure: TRANSESOPHAGEAL ECHOCARDIOGRAM (TEE);  Surgeon: Pixie Casino, MD;  Location: [facility];  Service: Cardiovascular;  Laterality: N/A;  ? ? ?Family History  ?Problem Relation Age of Onset  ? Hypertension Mother   ? Stroke Mother   ? Colon cancer Neg Hx   ? Esophageal cancer Neg Hx   ? Pancreatic cancer Neg Hx   ? Stomach cancer Neg Hx   ? ? ?Social History  ? ?Socioeconomic History  ? Marital status: Married  ?  Spouse name: Not on file  ? Number of children: Not on file  ? Years of education: 23  ? Highest education level: High school graduate  ?Occupational History  ? Occupation: Chef  ?Tobacco Use  ? Smoking status: Former  ?  Types: Cigarettes  ?  Quit date: 05/21/2019  ?  Years since quitting: 2.7  ? Smokeless tobacco: Never  ?Vaping Use  ? Vaping Use: Never used  ?Substance and Sexual Activity  ? Alcohol use: Not Currently  ? Drug use: No  ? Sexual activity: Not Currently  ?Other Topics Concern  ? Not on file  ?Social History Narrative  ? Marital status: separated; moved from [place] to [place] 1998  ? Children: 2 children; no grandchildren.  ? Employment: chef  ? Tobacco: 1/2ppd  ? Alcohol: none  ? Drugs: none  ? Exercise:  none.  ?  no Caffeine  ? Lives in a condo in a one story floor plan  ? ?Social Determinants of Health  ? ?Financial Resource Strain: Not on file  ?Food Insecurity: Not on file  ?Transportation Needs: Not on file  ?Physical Activity: Not on file  ?Stress: Not on file  ?Social Connections: Not on file  ?Intimate Partner Violence: Not on file  ? ? ?Outpatient Medications Prior to Visit  ?Medication Sig Dispense Refill  ? aspirin EC 81 MG tablet Take 81 mg by mouth daily. Swallow whole.    ? bacitracin 500 UNIT/GM ointment Apply 1 application topically 2 (two)  times daily. 15 g 0  ? blood glucose meter kit and supplies KIT Dispense based on patient and insurance preference. Use up to four times daily as directed. 1 each 0  ? Cholecalciferol (VITAMIN D) 50 MCG (2000 UT) CAPS Take 1 capsule by mouth daily.    ? gabapentin (NEURONTIN) 300 MG capsule Take 1 capsule (300 mg total) by mouth 2 (two) times daily. 180 capsule 1  ? triamcinolone ointment (KENALOG) 0.5 % APPLY TO AFFECTED AREA TWICE A DAY 30 g 0  ? albuterol (VENTOLIN HFA) 108 (90 Base) MCG/ACT inhaler Inhale 2 puffs into the lungs every 6 (six) hours as needed for wheezing or shortness of breath. 8 g 11  ? atorvastatin (LIPITOR) 40 MG tablet Take 1 tablet (40 mg total) by mouth daily. 90 tablet 1  ? busPIRone (BUSPAR) 10 MG tablet Take 10 mg by mouth daily.    ? topiramate (TOPAMAX) 50 MG tablet Take 1 tablet (50 mg total) by mouth 2 (two) times daily. 60 tablet 6  ? ?No facility-administered medications prior to visit.  ? ? ?No Known Allergies ? ?ROS ?Review of Systems ? ?  ?Objective:  ?  ?Physical Exam ?Constitutional:   ?   General: He is not in acute distress. ?   Appearance: He is normal weight. He is not ill-appearing, toxic-appearing or diaphoretic.  ?HENT:  ?   Head: Normocephalic and atraumatic.  ?   Nose: Nose normal.  ?Cardiovascular:  ?   Rate and Rhythm: Normal rate and regular rhythm.  ?   Pulses: Normal pulses.  ?   Heart sounds: Normal heart sounds.  ?Pulmonary:  ?   Effort: Pulmonary effort is normal.  ?   Breath sounds: Normal breath sounds.  ?Abdominal:  ?   Palpations: Abdomen is soft.  ?Musculoskeletal:     ?   General: Normal range of motion.  ?   Cervical back: Normal range of motion.  ?Skin: ?   General: Skin is warm.  ?   Capillary Refill: Capillary refill takes less than 2 seconds.  ?   Comments: Dark rash to right shin   ?Neurological:  ?   General: No focal deficit present.  ?   Mental Status: He is alert and oriented to person, place, and time.  ?Psychiatric:     ?   Mood and Affect:  Mood normal.     ?   Behavior: Behavior normal.     ?   Thought Content: Thought content normal.     ?   Judgment: Judgment normal.  ? ? ?BP 111/85   Pulse 69   Temp (!) 97.3 ?F (36.3 ?C)   Ht 6' (1.829 m)   Wt 174 lb 3.2 oz (79 kg)   SpO2 98%   BMI 23.63 kg/m?  ?Wt Readings from Last 3 Encounters:  ?  02/10/22 174 lb 3.2 oz (79 kg)  ?10/20/21 187 lb 8 oz (85 kg)  ?10/06/21 185 lb (83.9 kg)  ? ? ? ?Health Maintenance Due  ?Topic Date Due  ? Zoster Vaccines- Shingrix (1 of 2) Never done  ? COVID-19 Vaccine (3 - Booster for Pfizer series) 01/12/2021  ? ? ?There are no preventive care reminders to display for this patient. ? ?Lab Results  ?Component Value Date  ? TSH 0.820 06/30/2021  ? ?Lab Results  ?Component Value Date  ? WBC 5.1 06/30/2021  ? HGB 16.9 06/30/2021  ? HCT 55.5 (H) 06/30/2021  ? MCV 73 (L) 06/30/2021  ? PLT 255 06/30/2021  ? ?Lab Results  ?Component Value Date  ? NA 142 06/30/2021  ? K 4.7 06/30/2021  ? CO2 26 09/30/2020  ? GLUCOSE 111 (H) 06/30/2021  ? BUN 14 06/30/2021  ? CREATININE 0.89 06/30/2021  ? BILITOT 0.4 06/30/2021  ? ALKPHOS 55 06/30/2021  ? AST 23 06/30/2021  ? ALT 64 (H) 09/30/2020  ? PROT 6.0 06/30/2021  ? ALBUMIN 4.1 06/30/2021  ? CALCIUM 9.8 06/30/2021  ? ANIONGAP 7 10/13/2019  ? EGFR 105 06/30/2021  ? ?Lab Results  ?Component Value Date  ? CHOL 104 06/30/2021  ? ?Lab Results  ?Component Value Date  ? HDL 42 06/30/2021  ? ?Lab Results  ?Component Value Date  ? LDLCALC 49 06/30/2021  ? ?Lab Results  ?Component Value Date  ? TRIG 59 06/30/2021  ? ?Lab Results  ?Component Value Date  ? CHOLHDL 2.5 06/30/2021  ? ?Lab Results  ?Component Value Date  ? HGBA1C 6.7 (A) 10/20/2021  ? HGBA1C 6.7 10/20/2021  ? HGBA1C 6.7 (A) 10/20/2021  ? HGBA1C 6.7 10/20/2021  ? ? ?  ?Assessment & Plan:  ? ?Problem List Items Addressed This Visit   ? ?  ? Other  ? HLD (hyperlipidemia) ?Stable  ?Encouraged on going compliance with current medication regimen. I recommend eating a heart-healthy diet; low in fat  and cholesterol along with regular exercise. This will promote weight loss and improve cholesterol.  ?  ? Relevant Medications  ? atorvastatin (LIPITOR) 40 MG tablet  ? ?Other Visit Diagnoses   ? ? Diabetes me

## 2022-02-10 NOTE — Patient Instructions (Signed)

## 2022-02-11 LAB — COMP. METABOLIC PANEL (12)
AST: 29 IU/L (ref 0–40)
Albumin/Globulin Ratio: 2.1 (ref 1.2–2.2)
Albumin: 3.9 g/dL — ABNORMAL LOW (ref 4.0–5.0)
Alkaline Phosphatase: 60 IU/L (ref 44–121)
BUN/Creatinine Ratio: 14 (ref 9–20)
BUN: 15 mg/dL (ref 6–24)
Bilirubin Total: 0.3 mg/dL (ref 0.0–1.2)
Calcium: 8.9 mg/dL (ref 8.7–10.2)
Chloride: 101 mmol/L (ref 96–106)
Creatinine, Ser: 1.07 mg/dL (ref 0.76–1.27)
Globulin, Total: 1.9 g/dL (ref 1.5–4.5)
Glucose: 122 mg/dL — ABNORMAL HIGH (ref 70–99)
Potassium: 4.8 mmol/L (ref 3.5–5.2)
Sodium: 139 mmol/L (ref 134–144)
Total Protein: 5.8 g/dL — ABNORMAL LOW (ref 6.0–8.5)
eGFR: 85 mL/min/{1.73_m2} (ref >59)

## 2022-02-11 LAB — LIPID PANEL
Chol/HDL Ratio: 2.5 ratio (ref 0.0–5.0)
Cholesterol, Total: 92 mg/dL — ABNORMAL LOW (ref 100–199)
HDL: 37 mg/dL — ABNORMAL LOW (ref >39)
LDL Chol Calc (NIH): 40 mg/dL (ref 0–99)
Triglycerides: 71 mg/dL (ref 0–149)
VLDL Cholesterol Cal: 15 mg/dL (ref 5–40)

## 2022-02-11 NOTE — Progress Notes (Signed)
Carelink Summary Report / Loop Recorder 

## 2022-03-07 ENCOUNTER — Ambulatory Visit (INDEPENDENT_AMBULATORY_CARE_PROVIDER_SITE_OTHER): Payer: Medicare Other

## 2022-03-07 DIAGNOSIS — I639 Cerebral infarction, unspecified: Secondary | ICD-10-CM | POA: Diagnosis not present

## 2022-03-09 LAB — CUP PACEART REMOTE DEVICE CHECK
Date Time Interrogation Session: 20230405230959
Implantable Pulse Generator Implant Date: 20200828

## 2022-03-24 NOTE — Progress Notes (Signed)
Carelink Summary Report / Loop Recorder 

## 2022-04-11 ENCOUNTER — Ambulatory Visit (INDEPENDENT_AMBULATORY_CARE_PROVIDER_SITE_OTHER): Payer: Medicare Other

## 2022-04-11 DIAGNOSIS — I639 Cerebral infarction, unspecified: Secondary | ICD-10-CM

## 2022-04-13 LAB — CUP PACEART REMOTE DEVICE CHECK
Date Time Interrogation Session: 20230510231234
Implantable Pulse Generator Implant Date: 20200828

## 2022-05-02 NOTE — Progress Notes (Signed)
Carelink Summary Report / Loop Recorder 

## 2022-05-16 ENCOUNTER — Ambulatory Visit (INDEPENDENT_AMBULATORY_CARE_PROVIDER_SITE_OTHER): Payer: Medicare Other

## 2022-05-16 DIAGNOSIS — I639 Cerebral infarction, unspecified: Secondary | ICD-10-CM | POA: Diagnosis not present

## 2022-05-18 LAB — CUP PACEART REMOTE DEVICE CHECK
Date Time Interrogation Session: 20230612230540
Implantable Pulse Generator Implant Date: 20200828

## 2022-06-08 NOTE — Progress Notes (Signed)
Carelink Summary Report / Loop Recorder 

## 2022-06-20 ENCOUNTER — Ambulatory Visit (INDEPENDENT_AMBULATORY_CARE_PROVIDER_SITE_OTHER): Payer: Medicare Other

## 2022-06-20 DIAGNOSIS — I639 Cerebral infarction, unspecified: Secondary | ICD-10-CM | POA: Diagnosis not present

## 2022-06-21 LAB — CUP PACEART REMOTE DEVICE CHECK
Date Time Interrogation Session: 20230715230919
Implantable Pulse Generator Implant Date: 20200828

## 2022-07-25 ENCOUNTER — Ambulatory Visit (INDEPENDENT_AMBULATORY_CARE_PROVIDER_SITE_OTHER): Payer: Medicare Other

## 2022-07-25 DIAGNOSIS — I639 Cerebral infarction, unspecified: Secondary | ICD-10-CM

## 2022-07-25 LAB — CUP PACEART REMOTE DEVICE CHECK
Date Time Interrogation Session: 20230817231016
Implantable Pulse Generator Implant Date: 20200828

## 2022-07-29 NOTE — Progress Notes (Signed)
Carelink Summary Report / Loop Recorder 

## 2022-08-12 ENCOUNTER — Encounter: Payer: Self-pay | Admitting: Nurse Practitioner

## 2022-08-12 ENCOUNTER — Ambulatory Visit (INDEPENDENT_AMBULATORY_CARE_PROVIDER_SITE_OTHER): Payer: Medicare Other | Admitting: Nurse Practitioner

## 2022-08-12 ENCOUNTER — Ambulatory Visit: Payer: Medicare Other | Admitting: Nurse Practitioner

## 2022-08-12 VITALS — BP 113/89 | HR 65 | Temp 98.1°F | Ht 72.0 in | Wt 168.6 lb

## 2022-08-12 DIAGNOSIS — E119 Type 2 diabetes mellitus without complications: Secondary | ICD-10-CM | POA: Diagnosis not present

## 2022-08-12 DIAGNOSIS — R042 Hemoptysis: Secondary | ICD-10-CM

## 2022-08-12 DIAGNOSIS — F419 Anxiety disorder, unspecified: Secondary | ICD-10-CM | POA: Diagnosis not present

## 2022-08-12 LAB — POCT GLYCOSYLATED HEMOGLOBIN (HGB A1C)
HbA1c POC (<> result, manual entry): 6 % (ref 4.0–5.6)
HbA1c, POC (controlled diabetic range): 6 % (ref 0.0–7.0)
HbA1c, POC (prediabetic range): 6 % (ref 5.7–6.4)
Hemoglobin A1C: 6 % — AB (ref 4.0–5.6)

## 2022-08-12 MED ORDER — BUSPIRONE HCL 10 MG PO TABS: 10.0000 mg | ORAL_TABLET | Freq: Two times a day (BID) | ORAL | 0 refills | Status: AC

## 2022-08-12 NOTE — Patient Instructions (Signed)
1. Coughing up blood  - Ambulatory referral to ENT - DG Chest 2 View - CBC - Comprehensive metabolic panel  2. Anxiety  - busPIRone (BUSPAR) 10 MG tablet; Take 1 tablet (10 mg total) by mouth 2 (two) times daily.  Dispense: 60 tablet; Refill: 0  Follow up:  Follow up in 3 months or sooner if needed

## 2022-08-12 NOTE — Progress Notes (Unsigned)
$'@Patient'L$  ID: Brandon Summers, male    DOB: [birth year], 51 y.o.   MRN: [id number]  Chief Complaint  Patient presents with   Follow-up    Pt is here for 6 month's follow up visit. Pt states he has been spitting up blood for the past 6 months also has RT side weakness, head aches, constipation     Referring provider: Vevelyn Francois, NP   HPI  Brandon Summers presents for follow up.  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    Brandon Summers is in today for diabetes follow up. He is diet controlled statin therapy atorvastatin . The reported use of treatment is  consistent with prescribed. There are no reported side effects from the treatment. Home glucose monitoring is not done at home.    Patient complains today of coughing up blood - he states every time he clears his throat he notices blood. He states that this has been an issue since he had his stroke 3 years ago.    Denies fever, chills, headache, dizziness, visual changes, polydipsia,  polyphagia shortness of breath, dyspnea on exertion, chest pain, abdominal pain, nausea, vomiting, polyuria, constipation, diarrhea,  any edema, numbness, tingling, burning of hand or feet.        No Known Allergies  Immunization History  Administered Date(s) Administered   PFIZER(Purple Top)SARS-COV-2 Vaccination 05/20/2020, 11/17/2020    Past Medical History:  Diagnosis Date   CHF (congestive heart failure) (HCC)    Cough    CVA (cerebral vascular accident) 05/21/2019   Left thalamic stroke   HLD (hyperlipidemia) 05/23/2019   Mild vascular neurocognitive disorder 05/12/2020   Nonintractable headache 04/15/2020   Pacemaker    Right sided weakness 09/09/2019   Shortness  of breath    Visual problems    Vitamin D deficiency 11/2019    Tobacco History: Social History   Tobacco Use  Smoking Status Former   Types: Cigarettes   Quit date: 05/21/2019   Years since quitting: 3.2  Smokeless Tobacco Never   Counseling given: Not Answered   Outpatient Encounter Medications as of 08/12/2022  Medication Sig   aspirin EC 81 MG tablet Take 81 mg by mouth daily. Swallow whole.   atorvastatin (LIPITOR) 40 MG tablet Take 1 tablet (40 mg total) by mouth daily.   busPIRone (BUSPAR) 10 MG tablet Take 1 tablet (10 mg total) by mouth 2 (two) times daily.   bacitracin 500 UNIT/GM ointment Apply 1 application topically 2 (two) times daily. (Patient not taking: Reported on 08/12/2022)   blood glucose meter kit and supplies KIT Dispense based on patient and insurance preference. Use up to four times daily as directed. (Patient not taking: Reported on 08/12/2022)   Cholecalciferol (VITAMIN D) 50 MCG (2000 UT) CAPS Take 1 capsule by mouth daily. (Patient not taking: Reported on 08/12/2022)   gabapentin (NEURONTIN) 300 MG capsule Take 1 capsule (300 mg total) by mouth 2 (two) times daily.   scopolamine (TRANSDERM-SCOP) 1 MG/3DAYS Place 1 patch (1.5 mg total) onto the skin every 3 (three) days. (Patient not taking: Reported on 08/12/2022)   triamcinolone ointment (KENALOG) 0.5 % APPLY TO AFFECTED AREA TWICE A DAY (Patient not taking: Reported on 08/12/2022)   No facility-administered encounter medications on file as of 08/12/2022.     Review of Systems  Review  of Systems  Constitutional: Negative.   HENT: Negative.    Cardiovascular: Negative.   Gastrointestinal: Negative.   Allergic/Immunologic: Negative.   Neurological:  Positive for weakness (right side (history of stroke)).  Psychiatric/Behavioral: Negative.         Physical Exam  BP 113/89 (BP Location: Left Arm, Patient Position: Sitting, Cuff Size: Normal)   Pulse 65   Temp 98.1 F (36.7 C)   Ht 6' (1.829 m)    Wt 168 lb 9.6 oz (76.5 kg)   SpO2 100%   BMI 22.87 kg/m   Wt Readings from Last 5 Encounters:  08/12/22 168 lb 9.6 oz (76.5 kg)  02/10/22 174 lb 3.2 oz (79 kg)  10/20/21 187 lb 8 oz (85 kg)  10/06/21 185 lb (83.9 kg)  06/30/21 189 lb 0.6 oz (85.7 kg)     Physical Exam Vitals and nursing note reviewed.  Constitutional:      General: He is not in acute distress.    Appearance: He is well-developed.  Cardiovascular:     Rate and Rhythm: Normal rate and regular rhythm.  Pulmonary:     Effort: Pulmonary effort is normal.     Breath sounds: Normal breath sounds.  Skin:    General: Skin is warm and dry.  Neurological:     Mental Status: He is alert and oriented to person, place, and time.      Lab Results:  CBC    Component Value Date/Time   WBC 6.7 08/12/2022 1530   WBC 5.6 10/13/2019 0014   RBC 7.10 (HH) 08/12/2022 1530   RBC 6.64 (H) 10/13/2019 0014   HGB 16.1 08/12/2022 1530   HCT 50.1 08/12/2022 1530   PLT 273 08/12/2022 1530   MCV 71 (L) 08/12/2022 1530   MCH 22.7 (L) 08/12/2022 1530   MCH 22.9 (L) 10/13/2019 0014   MCHC 32.1 08/12/2022 1530   MCHC 30.6 10/13/2019 0014   RDW 18.4 (H) 08/12/2022 1530   LYMPHSABS 2.1 06/30/2021 1149   MONOABS 0.6 08/27/2019 0845   EOSABS 0.1 06/30/2021 1149   BASOSABS 0.0 06/30/2021 1149    BMET    Component Value Date/Time   NA 139 08/12/2022 1530   K 4.3 08/12/2022 1530   CL 101 08/12/2022 1530   CO2 23 08/12/2022 1530   GLUCOSE 95 08/12/2022 1530   GLUCOSE 113 (H) 10/13/2019 0014   BUN 12 08/12/2022 1530   CREATININE 1.07 08/12/2022 1530   CALCIUM 9.4 08/12/2022 1530   GFRNONAA 91 09/30/2020 1442   GFRAA 105 09/30/2020 1442    BNP No results found for: "BNP"  ProBNP No results found for: "PROBNP"  Imaging: No results found.   Assessment & Plan:   Coughing up blood - Ambulatory referral to ENT - DG Chest 2 View - CBC - Comprehensive metabolic panel  2. Anxiety  - busPIRone (BUSPAR) 10 MG  tablet; Take 1 tablet (10 mg total) by mouth 2 (two) times daily.  Dispense: 60 tablet; Refill: 0  Follow up:  Follow up in 3 months or sooner if needed     Fenton Foy, NP 08/16/2022

## 2022-08-13 LAB — COMPREHENSIVE METABOLIC PANEL
ALT: 29 IU/L (ref 0–44)
AST: 14 IU/L (ref 0–40)
Albumin/Globulin Ratio: 1.9 (ref 1.2–2.2)
Albumin: 3.9 g/dL — ABNORMAL LOW (ref 4.1–5.1)
Alkaline Phosphatase: 51 IU/L (ref 44–121)
BUN/Creatinine Ratio: 11 (ref 9–20)
BUN: 12 mg/dL (ref 6–24)
Bilirubin Total: 0.4 mg/dL (ref 0.0–1.2)
CO2: 23 mmol/L (ref 20–29)
Calcium: 9.4 mg/dL (ref 8.7–10.2)
Chloride: 101 mmol/L (ref 96–106)
Creatinine, Ser: 1.07 mg/dL (ref 0.76–1.27)
Globulin, Total: 2.1 g/dL (ref 1.5–4.5)
Glucose: 95 mg/dL (ref 70–99)
Potassium: 4.3 mmol/L (ref 3.5–5.2)
Sodium: 139 mmol/L (ref 134–144)
Total Protein: 6 g/dL (ref 6.0–8.5)
eGFR: 85 mL/min/{1.73_m2} (ref >59)

## 2022-08-13 LAB — CBC
Hematocrit: 50.1 % (ref 37.5–51.0)
Hemoglobin: 16.1 g/dL (ref 13.0–17.7)
MCH: 22.7 pg — ABNORMAL LOW (ref 26.6–33.0)
MCHC: 32.1 g/dL (ref 31.5–35.7)
MCV: 71 fL — ABNORMAL LOW (ref 79–97)
Platelets: 273 10*3/uL (ref 150–450)
RBC: 7.1 x10E6/uL (ref 4.14–5.80)
RDW: 18.4 % — ABNORMAL HIGH (ref 11.6–15.4)
WBC: 6.7 10*3/uL (ref 3.4–10.8)

## 2022-08-16 ENCOUNTER — Other Ambulatory Visit: Payer: Self-pay | Admitting: Nurse Practitioner

## 2022-08-16 DIAGNOSIS — D751 Secondary polycythemia: Secondary | ICD-10-CM

## 2022-08-16 DIAGNOSIS — R042 Hemoptysis: Secondary | ICD-10-CM | POA: Insufficient documentation

## 2022-08-16 NOTE — Assessment & Plan Note (Signed)
-   Ambulatory referral to ENT - DG Chest 2 View - CBC - Comprehensive metabolic panel  2. Anxiety  - busPIRone (BUSPAR) 10 MG tablet; Take 1 tablet (10 mg total) by mouth 2 (two) times daily.  Dispense: 60 tablet; Refill: 0  Follow up:  Follow up in 3 months or sooner if needed

## 2022-08-17 ENCOUNTER — Telehealth: Payer: Self-pay | Admitting: Hematology and Oncology

## 2022-08-17 NOTE — Telephone Encounter (Signed)
Scheduled appt per 9/19 referral. Pt is aware of appt date and time. Pt is aware to arrive 15 mins prior to appt time and to bring and updated insurance card. Pt is aware of appt location.   

## 2022-08-18 NOTE — Progress Notes (Signed)
Carelink Summary Report / Loop Recorder 

## 2022-08-29 ENCOUNTER — Ambulatory Visit (INDEPENDENT_AMBULATORY_CARE_PROVIDER_SITE_OTHER): Payer: Medicare Other

## 2022-08-29 DIAGNOSIS — I639 Cerebral infarction, unspecified: Secondary | ICD-10-CM | POA: Diagnosis not present

## 2022-08-30 LAB — CUP PACEART REMOTE DEVICE CHECK
Date Time Interrogation Session: 20231001230754
Implantable Pulse Generator Implant Date: 20200828

## 2022-09-05 ENCOUNTER — Inpatient Hospital Stay: Payer: Medicare Other | Attending: Hematology and Oncology

## 2022-09-05 ENCOUNTER — Inpatient Hospital Stay (HOSPITAL_BASED_OUTPATIENT_CLINIC_OR_DEPARTMENT_OTHER): Payer: Medicare Other | Admitting: Hematology and Oncology

## 2022-09-05 ENCOUNTER — Other Ambulatory Visit: Payer: Self-pay

## 2022-09-05 ENCOUNTER — Encounter: Payer: Self-pay | Admitting: Hematology and Oncology

## 2022-09-05 VITALS — BP 140/87 | HR 61 | Temp 97.9°F | Resp 18 | Ht 72.0 in | Wt 172.8 lb

## 2022-09-05 DIAGNOSIS — R799 Abnormal finding of blood chemistry, unspecified: Secondary | ICD-10-CM

## 2022-09-05 DIAGNOSIS — Z79899 Other long term (current) drug therapy: Secondary | ICD-10-CM | POA: Insufficient documentation

## 2022-09-05 DIAGNOSIS — Z8673 Personal history of transient ischemic attack (TIA), and cerebral infarction without residual deficits: Secondary | ICD-10-CM | POA: Diagnosis not present

## 2022-09-05 DIAGNOSIS — R718 Other abnormality of red blood cells: Secondary | ICD-10-CM

## 2022-09-05 DIAGNOSIS — D563 Thalassemia minor: Secondary | ICD-10-CM

## 2022-09-05 DIAGNOSIS — Z87891 Personal history of nicotine dependence: Secondary | ICD-10-CM | POA: Diagnosis not present

## 2022-09-05 NOTE — Progress Notes (Signed)
Brandon Summers CONSULT NOTE  Patient Care Team: Brandon Francois, NP as PCP - General (Adult Health Nurse Practitioner) Brandon Partridge, DO as Consulting Physician (Neurology)   ASSESSMENT & PLAN Thalassemia trait Based on the trend of his RBC count and microcytosis, I suspect he has undiagnosed thalassemia I recommend hemoglobinopathy evaluation with hemoglobin electrophoresis We will call him with test results If confirmed thalassemia, he does not need long-term follow-up  Orders Placed This Encounter  Procedures   Hgb Fractionation Cascade    Standing Status:   Future    Number of Occurrences:   1    Standing Expiration Date:   09/06/2023    The total time spent in the appointment was 55 minutes encounter with patients including review of chart and various tests results, discussions about plan of care and coordination of care plan   All questions were answered. The patient knows to call the clinic with any problems, questions or concerns. No barriers to learning was detected.  Brandon Lark, MD 09/05/2022   CHIEF COMPLAINTS/PURPOSE OF CONSULTATION:  Erythrocytosis, high RBC count  HISTORY OF PRESENTING ILLNESS:  Brandon Summers 51 y.o. male is here because of abnormal CBC.  He was found to have abnormal CBC from abnormal CBC I have read the opportunity to review his CBC dated back to 2010 On October 10, 2009, his white blood cell count was 13.5, hemoglobin 17.5, MCV of 77.5 and RBC count of 6.9 million Most recently, on 08/12/2022, his white blood cell count is 6.7, hemoglobin 16.1, MCV of 71 and RBC count of 7.1 million  He denies intermittent headaches, shortness of breath on exertion, frequent leg cramps and occasional chest pain.  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   Nonintractable headache 04/15/2020   Pacemaker    Right sided weakness 09/09/2019   Shortness of breath    Visual problems    Vitamin D deficiency 11/2019    SURGICAL HISTORY: Past Surgical History:  Procedure Laterality Date   BUBBLE STUDY  05/23/2019   Procedure: BUBBLE STUDY;  Surgeon: Pixie Casino, MD;  Location[facility] ENDOSCOPY;  Service: Cardiovascular;;   implantable loop recorder placement  07/26/2019   MDT Reveal TFTD3 implanted by Dr Rayann Heman for cryptogenic stroke   TEE WITHOUT CARDIOVERSION N/A 05/23/2019   Procedure: TRANSESOPHAGEAL ECHOCARDIOGRAM (TEE);  Surgeon: Pixie Casino, MD;  Location: [facility]OSCOPY;  Service: Cardiovascular;  Laterality: N/A;    SOCIAL HISTORY: Social History   Socioeconomic History   Marital status: Married    Spouse name: Not on file   Number of children: Not on file   Years of education: 12   Highest education level: High school graduate  Occupational History   Occupation: Chef  Tobacco Use   Smoking status: Former    Types: Cigarettes    Quit date: 05/21/2019    Years since quitting: 3.2   Smokeless tobacco: Never  Vaping Use   Vaping Use: Never used  Substance and Sexual Activity   Alcohol use: Not Currently   Drug use: No   Sexual  activity: Not Currently  Other Topics Concern   Not on file  Social History Narrative   Marital status: separated; moved from [place] to [place] 1998   Children: 2 children; no grandchildren.   Employment: chef   Tobacco: 1/2ppd   Alcohol: none   Drugs: none   Exercise: none.    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  MEDICATIONS:  Current Outpatient Medications  Medication Sig Dispense Refill   aspirin EC 81 MG tablet Take 81 mg by mouth daily. Swallow whole.     atorvastatin (LIPITOR) 40 MG tablet Take 1 tablet (40 mg total) by mouth daily. 90 tablet 1   blood glucose meter kit and supplies KIT Dispense based on patient and insurance preference. Use up to four times daily as directed. (Patient not taking: Reported on 08/12/2022) 1 each 0   busPIRone (BUSPAR) 10 MG tablet Take 1 tablet (10 mg total) by mouth 2 (two) times daily. 60 tablet 0   Cholecalciferol (VITAMIN D) 50 MCG (2000 UT) CAPS Take 1 capsule by mouth daily. (Patient not taking: Reported on 08/12/2022)     gabapentin (NEURONTIN) 300 MG capsule Take 1 capsule (300 mg total) by mouth 2 (two) times daily. 180 capsule 1   No current facility-administered medications for this visit.    REVIEW OF SYSTEMS:   Constitutional: Denies fevers, chills or abnormal night sweats Eyes: Denies blurriness of vision, double vision or watery eyes Ears, nose, mouth, throat, and face: Denies mucositis or sore throat Respiratory: Denies cough, dyspnea or wheezes Cardiovascular: Denies palpitation, chest discomfort or lower extremity swelling Gastrointestinal:  Denies nausea, heartburn or change in bowel habits Skin: Denies abnormal skin rashes Lymphatics: Denies new lymphadenopathy or easy bruising Neurological:Denies numbness, tingling or new weaknesses Behavioral/Psych: Mood is stable, no new changes  All other systems were reviewed with the patient and are negative.  PHYSICAL  EXAMINATION: ECOG PERFORMANCE STATUS: 0 - Asymptomatic  Vitals:   09/05/22 1214  BP: (!) 140/87  Pulse: 61  Resp: 18  Temp: 97.9 F (36.6 C)  SpO2: 100%   Filed Weights   09/05/22 1214  Weight: 172 lb 12.8 oz (78.4 kg)    GENERAL:alert, no distress and comfortable SKIN: skin color, texture, turgor are normal, no rashes or significant lesions EYES: normal, conjunctiva are pink and non-injected, sclera clear OROPHARYNX:no exudate, no erythema and lips, buccal mucosa, and tongue normal  NECK: supple, thyroid normal size, non-tender, without nodularity LYMPH:  no palpable lymphadenopathy in the cervical, axillary or inguinal LUNGS: clear to auscultation and percussion with normal breathing effort HEART: regular rate & rhythm and no murmurs and no lower extremity edema ABDOMEN:abdomen soft, non-tender and normal bowel sounds Musculoskeletal:no cyanosis of digits and no clubbing  PSYCH: alert & oriented x 3 with fluent speech NEURO: no focal motor/sensory  deficits  LABORATORY DATA:  I have reviewed the data as listed Recent Results (from the past 2160 hour(s))  CUP PACEART REMOTE DEVICE CHECK     Status: None   Collection Time: 06/11/22 11:09 PM  Result Value Ref Range   Date Time Interrogation Session [phone]    Pulse Generator Manufacturer MERM    Pulse Gen Model LNQ22 LINQ II    Pulse Gen Serial Number [id number] [facility] Name [place] Pulse Generator Type ICM/ILR    Implantable Pulse Generator Implant Date [id number]   CUP PACEART REMOTE DEVICE CHECK     Status: None   Collection Time: 07/14/22 11:10 PM  Result Value Ref Range   Date Time Interrogation Session [id number]    Pulse Generator Manufacturer MERM    Pulse Gen Model LNQ22 LINQ II    Pulse Gen Serial Number [id number] [facility] Name [place] Pulse Generator Type ICM/ILR    Implantable Pulse Generator Implant Date [id number]   CBC     Status: Abnormal    Collection Time: 08/12/22  3:30 PM  Result Value Ref Range   WBC 6.7 3.4 - 10.8 x10E3/uL   RBC 7.10 (HH) 4.14 - 5.80 x10E6/uL    Comment: Removal of plasma from EDTA tube will result in spuriously elevated CBC results. Redraw and repeat if results do not correlate with patient's clinical condition.    Hemoglobin 16.1 13.0 - 17.7 g/dL   Hematocrit 50.1 37.5 - 51.0 %   MCV 71 (L) 79 - 97 fL   MCH 22.7 (L) 26.6 - 33.0 pg   MCHC 32.1 31.5 - 35.7 g/dL   RDW 18.4 (H) 11.6 - 15.4 %   Platelets 273 150 - 450 x10E3/uL  Comprehensive metabolic panel     Status: Abnormal   Collection Time: 08/12/22  3:30 PM  Result Value Ref Range   Glucose 95 70 - 99 mg/dL   BUN 12 6 - 24 mg/dL   Creatinine, Ser 1.07 0.76 - 1.27 mg/dL   eGFR 85 >59 mL/min/1.73   BUN/Creatinine Ratio 11 9 - 20   Sodium 139 134 - 144 mmol/L   Potassium 4.3 3.5 - 5.2 mmol/L   Chloride 101 96 - 106 mmol/L   CO2 23 20 - 29 mmol/L   Calcium 9.4 8.7 - 10.2 mg/dL   Total Protein 6.0 6.0 - 8.5 g/dL   Albumin 3.9 (L) 4.1 - 5.1 g/dL   Globulin, Total 2.1 1.5 - 4.5 g/dL   Albumin/Globulin Ratio 1.9 1.2 - 2.2   Bilirubin Total 0.4 0.0 - 1.2 mg/dL   Alkaline Phosphatase 51 44 - 121 IU/L   AST 14 0 - 40 IU/L   ALT 29 0 - 44 IU/L  POCT glycosylated hemoglobin (Hb A1C)     Status: Abnormal   Collection Time: 08/12/22  3:41 PM  Result Value Ref Range   Hemoglobin A1C 6.0 (A) 4.0 - 5.6 %   HbA1c POC (<> result, manual entry) 6.0 4.0 - 5.6 %   HbA1c, POC (prediabetic range) 6.0 5.7 - 6.4 %   HbA1c, POC (controlled diabetic range) 6.0 0.0 - 7.0 %  CUP PACEART REMOTE DEVICE CHECK     Status: None   Collection Time: 08/28/22 11:07 PM  Result Value Ref Range   Date Time Interrogation Session [id number]    Pulse Generator Manufacturer MERM    Pulse Gen Model LNQ22 LINQ II    Pulse  Gen Serial Number [id number] [facility] Name [facility]    Implantable Pulse Generator Type ICM/ILR    Implantable Pulse Generator Implant Date  [id number]     RADIOGRAPHIC STUDIES: I have personally reviewed the radiological images as listed and agreed with the findings in the report. CUP PACEART REMOTE DEVICE CHECK  Result Date: 08/30/2022 ILR summary report received. Battery status OK. Normal device function. No new symptom, tachy, brady, or pause episodes. No new AF episodes. Monthly summary reports and ROV/PRN LA

## 2022-09-05 NOTE — Assessment & Plan Note (Signed)
Based on the trend of his RBC count and microcytosis, I suspect he has undiagnosed thalassemia I recommend hemoglobinopathy evaluation with hemoglobin electrophoresis We will call him with test results If confirmed thalassemia, he does not need long-term follow-up

## 2022-09-07 ENCOUNTER — Other Ambulatory Visit: Payer: Self-pay | Admitting: Hematology and Oncology

## 2022-09-07 ENCOUNTER — Other Ambulatory Visit: Payer: Self-pay

## 2022-09-07 ENCOUNTER — Telehealth: Payer: Self-pay

## 2022-09-07 ENCOUNTER — Inpatient Hospital Stay: Payer: Medicare Other

## 2022-09-07 DIAGNOSIS — D563 Thalassemia minor: Secondary | ICD-10-CM

## 2022-09-07 DIAGNOSIS — R718 Other abnormality of red blood cells: Secondary | ICD-10-CM

## 2022-09-07 DIAGNOSIS — R799 Abnormal finding of blood chemistry, unspecified: Secondary | ICD-10-CM | POA: Diagnosis not present

## 2022-09-07 LAB — CBC WITH DIFFERENTIAL/PLATELET
Abs Immature Granulocytes: 0.02 10*3/uL (ref 0.00–0.07)
Basophils Absolute: 0 10*3/uL (ref 0.0–0.1)
Basophils Relative: 0 %
Eosinophils Absolute: 0.1 10*3/uL (ref 0.0–0.5)
Eosinophils Relative: 1 %
HCT: 50.1 % (ref 39.0–52.0)
Hemoglobin: 16.3 g/dL (ref 13.0–17.0)
Immature Granulocytes: 0 %
Lymphocytes Relative: 33 %
Lymphs Abs: 2.5 10*3/uL (ref 0.7–4.0)
MCH: 22.8 pg — ABNORMAL LOW (ref 26.0–34.0)
MCHC: 32.5 g/dL (ref 30.0–36.0)
MCV: 70 fL — ABNORMAL LOW (ref 80.0–100.0)
Monocytes Absolute: 0.5 10*3/uL (ref 0.1–1.0)
Monocytes Relative: 7 %
Neutro Abs: 4.3 10*3/uL (ref 1.7–7.7)
Neutrophils Relative %: 59 %
Platelets: 247 10*3/uL (ref 150–400)
RBC: 7.16 MIL/uL — ABNORMAL HIGH (ref 4.22–5.81)
RDW: 18.6 % — ABNORMAL HIGH (ref 11.5–15.5)
WBC: 7.5 10*3/uL (ref 4.0–10.5)
nRBC: 0 % (ref 0.0–0.2)

## 2022-09-07 LAB — HGB FRACTIONATION CASCADE
Hgb A2: 2.3 % (ref 1.8–3.2)
Hgb A: 97.7 % (ref 96.4–98.8)
Hgb F: 0 % (ref 0.0–2.0)
Hgb S: 0 %

## 2022-09-07 LAB — FERRITIN: Ferritin: 14 ng/mL — ABNORMAL LOW (ref 24–336)

## 2022-09-07 LAB — IRON AND IRON BINDING CAPACITY (CC-WL,HP ONLY)
Iron: 108 ug/dL (ref 45–182)
Saturation Ratios: 28 % (ref 17.9–39.5)
TIBC: 382 ug/dL (ref 250–450)
UIBC: 274 ug/dL

## 2022-09-07 NOTE — Telephone Encounter (Signed)
-----   Message from Heath Lark, MD sent at 09/07/2022  9:54 AM EDT ----- The test sent on Monday was non diagnostic Can you call him and have another lab appt? I have ordered additional labs Will call him with results

## 2022-09-07 NOTE — Telephone Encounter (Signed)
Called and given below message. He verbalized understanding and appt scheduled at 2 pm this afternoon.

## 2022-09-12 NOTE — Progress Notes (Signed)
Carelink Summary Report / Loop Recorder 

## 2022-09-21 ENCOUNTER — Telehealth: Payer: Self-pay

## 2022-09-21 LAB — ALPHA-THALASSEMIA GENOTYPR

## 2022-09-21 NOTE — Telephone Encounter (Signed)
-----   Message from Heath Lark, MD sent at 09/21/2022  2:11 PM EDT ----- Pls call him and let him know recent tests confirmed thalassemia, no need long term follow up

## 2022-09-21 NOTE — Telephone Encounter (Signed)
Patient informed of recent results. Patient verbalized an understanding of the information. All questions answered during call.

## 2022-10-03 ENCOUNTER — Ambulatory Visit (INDEPENDENT_AMBULATORY_CARE_PROVIDER_SITE_OTHER): Payer: Medicare Other

## 2022-10-03 DIAGNOSIS — I639 Cerebral infarction, unspecified: Secondary | ICD-10-CM

## 2022-10-05 LAB — CUP PACEART REMOTE DEVICE CHECK
Date Time Interrogation Session: 20231103230502
Implantable Pulse Generator Implant Date: 20200828

## 2022-11-07 ENCOUNTER — Ambulatory Visit (INDEPENDENT_AMBULATORY_CARE_PROVIDER_SITE_OTHER): Payer: Medicare Other

## 2022-11-07 DIAGNOSIS — I639 Cerebral infarction, unspecified: Secondary | ICD-10-CM

## 2022-11-08 LAB — CUP PACEART REMOTE DEVICE CHECK
Date Time Interrogation Session: 20231210232034
Implantable Pulse Generator Implant Date: 20200828

## 2022-11-09 NOTE — Progress Notes (Signed)
Carelink Summary Report / Loop Recorder 

## 2022-11-11 ENCOUNTER — Ambulatory Visit: Payer: Medicare Other | Admitting: Nurse Practitioner

## 2022-12-09 ENCOUNTER — Ambulatory Visit: Payer: Medicare Other | Admitting: Nurse Practitioner

## 2022-12-12 ENCOUNTER — Ambulatory Visit (INDEPENDENT_AMBULATORY_CARE_PROVIDER_SITE_OTHER): Payer: Medicare Other

## 2022-12-12 DIAGNOSIS — I639 Cerebral infarction, unspecified: Secondary | ICD-10-CM | POA: Diagnosis not present

## 2022-12-14 ENCOUNTER — Ambulatory Visit (INDEPENDENT_AMBULATORY_CARE_PROVIDER_SITE_OTHER): Payer: Medicare Other | Admitting: Nurse Practitioner

## 2022-12-14 ENCOUNTER — Encounter: Payer: Self-pay | Admitting: Nurse Practitioner

## 2022-12-14 VITALS — BP 106/74 | HR 73 | Temp 97.7°F | Ht 72.0 in | Wt 178.6 lb

## 2022-12-14 DIAGNOSIS — E119 Type 2 diabetes mellitus without complications: Secondary | ICD-10-CM | POA: Diagnosis not present

## 2022-12-14 DIAGNOSIS — Z23 Encounter for immunization: Secondary | ICD-10-CM | POA: Diagnosis not present

## 2022-12-14 LAB — CUP PACEART REMOTE DEVICE CHECK
Date Time Interrogation Session: 20240112231113
Implantable Pulse Generator Implant Date: 20200828

## 2022-12-14 LAB — POCT GLYCOSYLATED HEMOGLOBIN (HGB A1C): Hemoglobin A1C: 6.3 % — AB (ref 4.0–5.6)

## 2022-12-14 NOTE — Progress Notes (Signed)
Subjective:    Patient ID: Brandon Summers, male    DOB: 13-Oct-1971, 52 y.o.   MRN: [id number]  Brandon Summers is a 52 y.o. male who presents for follow-up of Type 2 diabetes mellitus.  Patient is not checking home blood sugars.   Home blood sugar records: patient does not check sugars How often is blood sugars being checked: N/A Current symptoms/problems include  dizzy  and have been unchanged. Daily foot checks: no   Any foot concerns: none  Last eye exam: over one year Exercise:  staying active QD  The following portions of the patient's history were reviewed and updated as appropriate: allergies, current medications, past medical history, past social history and problem list.  Review of Systems  Constitutional: Negative.   HENT: Negative.    Eyes: Negative.   Respiratory: Negative.    Cardiovascular: Negative.   Gastrointestinal: Negative.   Genitourinary: Negative.   Musculoskeletal: Negative.   Skin: Negative.   Neurological: Negative.   Endo/Heme/Allergies: Negative.   Psychiatric/Behavioral: Negative.          Objective:    Physical Exam Constitutional:      General: He is not in acute distress. Cardiovascular:     Rate and Rhythm: Normal rate and regular rhythm.  Pulmonary:     Effort: Pulmonary effort is normal.     Breath sounds: Normal breath sounds.  Skin:    General: Skin is warm and dry.  Neurological:     Mental Status: He is alert and oriented to person, place, and time.  Psychiatric:        Mood and Affect: Affect normal.      There were no vitals taken for this visit.  Lab Review    Latest Ref Rng & Units 08/12/2022    3:41 PM 08/12/2022    3:30 PM 02/10/2022    3:58 PM 02/10/2022    3:51 PM 10/20/2021    2:32 PM  Diabetic Labs  HbA1c 5.7 - 6.4 % 0.0 - 7.0 % 4.0 - 5.6 % 4.0 - 5.6 % 6.0    6.0    6.0    6.0   6.4    6.4    6.4    6.4   6.7    6.7    6.7    6.7   Chol 100 - 199 mg/dL    92    HDL >39 mg/dL    37    Calc LDL 0 - 99  mg/dL    40    Triglycerides 0 - 149 mg/dL    71    Creatinine 0.76 - 1.27 mg/dL  1.07   1.07        09/05/2022   12:14 PM 08/12/2022    2:56 PM 02/10/2022    3:14 PM 10/20/2021    1:52 PM 10/06/2021    9:46 AM  BP/Weight  Systolic BP 267 124 580 998 338  Diastolic BP 87 89 85 78 89  Wt. (Lbs) 172.8 168.6 174.2 187.5 185  BMI 23.44 kg/m2 22.87 kg/m2 23.63 kg/m2 25.43 kg/m2 25.09 kg/m2       No data to display          Brandon Summers  reports that he quit smoking about 3 years ago. His smoking use included cigarettes. He has never used smokeless tobacco. He reports that he does not currently use alcohol. He reports that he does not use drugs.     Assessment & Plan:    No  diagnosis found.  Rx changes: none Education: Reviewed 'ABCs' of diabetes management (respective goals in parentheses):  A1C (<7), blood pressure (<130/80), and cholesterol (LDL <100). Compliance at present is estimated to be excellent. Efforts to improve compliance (if necessary) will be directed at increased exercise. Follow up: 6 months   Patient Instructions  1. Diabetes mellitus type 2, diet-controlled (HCC)  - Microalbumin/Creatinine Ratio, Urine - POCT glycosylated hemoglobin (Hb A1C)  2. Flu vaccine need  - Flu Vaccine QUAD 25mo+IM (Fluarix, Fluzone & Alfiuria Quad PF)  Follow up:  Follow up in 6 months or sooner if needed  Lazaro Arms, FNP-C 12/14/22

## 2022-12-14 NOTE — Patient Instructions (Addendum)
1. Diabetes mellitus type 2, diet-controlled (HCC)  - Microalbumin/Creatinine Ratio, Urine - POCT glycosylated hemoglobin (Hb A1C)  2. Flu vaccine need  - Flu Vaccine QUAD 76mo+IM (Fluarix, Fluzone & Alfiuria Quad PF)  Follow up:  Follow up in 6 months or sooner if needed

## 2022-12-15 LAB — MICROALBUMIN / CREATININE URINE RATIO
Creatinine, Urine: 36.5 mg/dL
Microalb/Creat Ratio: <8 mg/g creat (ref 0–29)
Microalbumin, Urine: <3 ug/mL

## 2022-12-15 NOTE — Progress Notes (Signed)
[facility] Summary Report / Loop Recorder

## 2023-01-15 LAB — CUP PACEART REMOTE DEVICE CHECK
Date Time Interrogation Session: 20240214230941
Implantable Pulse Generator Implant Date: 20200828

## 2023-01-16 ENCOUNTER — Ambulatory Visit (INDEPENDENT_AMBULATORY_CARE_PROVIDER_SITE_OTHER): Payer: Medicare Other

## 2023-01-16 DIAGNOSIS — I639 Cerebral infarction, unspecified: Secondary | ICD-10-CM | POA: Diagnosis not present

## 2023-01-19 ENCOUNTER — Ambulatory Visit: Payer: Medicare Other

## 2023-01-19 VITALS — BP 124/78 | HR 87 | Temp 98.6°F | Ht 72.0 in | Wt 177.2 lb

## 2023-01-19 DIAGNOSIS — Z0000 Encounter for general adult medical examination without abnormal findings: Secondary | ICD-10-CM | POA: Diagnosis not present

## 2023-01-19 NOTE — Progress Notes (Signed)
Subjective:   Brandon Summers is a 52 y.o. male who presents for Medicare Annual/Subsequent preventive examination.  Review of Systems      Cardiac Risk Factors include: advanced age (>68mn, >>63women);male gender     Objective:    Today's Vitals   01/19/23 1434  BP: 124/78  Pulse: 87  Temp: 98.6 F (37 C)  TempSrc: Oral  SpO2: 98%  Weight: 177 lb 3.2 oz (80.4 kg)  Height: 6' (1.829 m)   Body mass index is 24.03 kg/m.     01/19/2023    2:50 PM 06/11/2021   10:05 AM 12/07/2020    8:17 AM 06/04/2020    3:09 PM 12/04/2019   12:59 PM 10/12/2019   11:39 PM 08/27/2019    7:39 AM  Advanced Directives  Does Patient Have a Medical Advance Directive? No No No No No No No  Would patient like information on creating a medical advance directive? No - Patient declined     No - Patient declined     Current Medications (verified) Outpatient Encounter Medications as of 01/19/2023  Medication Sig   aspirin EC 81 MG tablet Take 81 mg by mouth daily. Swallow whole.   atorvastatin (LIPITOR) 40 MG tablet Take 1 tablet (40 mg total) by mouth daily.   blood glucose meter kit and supplies KIT Dispense based on patient and insurance preference. Use up to four times daily as directed.   Cholecalciferol (VITAMIN D) 50 MCG (2000 UT) CAPS Take 1 capsule by mouth daily. (Patient not taking: Reported on 08/12/2022)   gabapentin (NEURONTIN) 300 MG capsule Take 1 capsule (300 mg total) by mouth 2 (two) times daily.   No facility-administered encounter medications on file as of 01/19/2023.    Allergies (verified) Patient has no known allergies.   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   Nonintractable headache 04/15/2020   Pacemaker    Right sided weakness 09/09/2019   Shortness of  breath    Visual problems    Vitamin D deficiency 11/2019   Past Surgical History:  Procedure Laterality Date   BUBBLE STUDY  05/23/2019   Procedure: BUBBLE STUDY;  Surgeon: HPixie Casino MD;  [facility]  Service: Cardiovascular;;   implantable loop recorder placement  07/26/2019   MDT Reveal [id number] by Dr ARayann Hemanfor cryptogenic stroke   TEE WITHOUT CARDIOVERSION N/A 05/23/2019   Procedure: TRANSESOPHAGEAL ECHOCARDIOGRAM (TEE);  Surgeon: HPixie Casino MD;  [facility];  Service: Cardiovascular;  Laterality: N/A;   Family History  Problem Relation Age of Onset   Hypertension Mother    Stroke Mother    Colon cancer Neg Hx    Esophageal cancer Neg Hx    Pancreatic cancer Neg Hx    Stomach cancer Neg Hx    Social History   Socioeconomic History   Marital status: Married    Spouse name: Not on file   Number of children: Not on file   Years of education: 12   Highest education level: High school graduate  Occupational History   Occupation: Chef  Tobacco Use   Smoking status: Former    Types: Cigarettes    Quit date: 05/21/2019    Years since quitting: 3.6   Smokeless tobacco: Never  Vaping Use  Vaping Use: Never used  Substance and Sexual Activity   Alcohol use: Not Currently   Drug use: No   Sexual activity: Not Currently  Other Topics Concern   Not on file  Social History Narrative   Marital status: separated; moved from [place] to [place] 1998   Children: 2 children; no grandchildren.   Employment: chef   Tobacco: 1/2ppd   Alcohol: none   Drugs: none   Exercise: none.    no Caffeine   Lives in a condo in a one story floor plan   Social Determinants of Health   Financial Resource Strain: Low Risk  (01/19/2023)   Overall Financial Resource Strain (CARDIA)    Difficulty of Paying Living Expenses: Not hard at all  Food Insecurity: No Food Insecurity (01/19/2023)   Hunger Vital Sign    Worried About Running Out of Food in the Last  Year: Never true    Ran Out of Food in the Last Year: Never true  Transportation Needs: No Transportation Needs (01/19/2023)   PRAPARE - Hydrologist (Medical): No    Lack of Transportation (Non-Medical): No  Physical Activity: Insufficiently Active (01/19/2023)   Exercise Vital Sign    Days of Exercise per Week: 7 days    Minutes of Exercise per Session: 20 min  Stress: No Stress Concern Present (01/19/2023)   [name]    Feeling of Stress : Not at all  Social Connections: Moderately Isolated (01/19/2023)   Social Connection and Isolation Panel [NHANES]    Frequency of Communication with Friends and Family: More than three times a week    Frequency of Social Gatherings with Friends and Family: More than three times a week    Attends Religious Services: 1 to 4 times per year    Active Member of [employer] or Organizations: No    Attends Music therapist: Never    Marital Status: Divorced    Tobacco Counseling Counseling given: Not Answered   Clinical Intake:  Pre-visit preparation completed: No  Pain : No/denies pain     BMI - recorded: 24.03 Nutritional Risks: None Diabetes: No  How often do you need to have someone help you when you read instructions, pamphlets, or other written materials from your doctor or pharmacy?: 5 - Always (Daughter assist)  Diabetic?  No  Interpreter Needed?: No  Information entered by :: Rolene Arbour LPN   Activities of Daily Living    01/19/2023    2:48 PM  In your present state of health, do you have any difficulty performing the following activities:  Hearing? 0  Vision? 0  Difficulty concentrating or making decisions? 0  Walking or climbing stairs? 0  Dressing or bathing? 0  Doing errands, shopping? 0  Preparing Food and eating ? N  Using the Toilet? N  In the past six months, have you accidently leaked urine? N  Do you have  problems with loss of bowel control? N  Managing your Medications? N  Managing your Finances? N  Housekeeping or managing your Housekeeping? N    Patient Care Team: Fenton Foy, NP as PCP - General (Pulmonary Disease) Pieter Partridge, DO as Consulting Physician (Neurology)  Indicate any recent Medical Services you may have received from other than [facility] providers in the past year (date may be approximate).     Assessment:   This is a routine wellness examination for [place].  Hearing/Vision screen  Hearing Screening - Comments:: Denies hearing difficulties   Vision Screening - Comments:: Wears rx glasses - up to date with routine eye exams with  [facility]  Dietary issues and exercise activities discussed: Current Exercise Habits: Home exercise routine, Type of exercise: walking, Time (Minutes): 20, Frequency (Times/Week): 7, Weekly Exercise (Minutes/Week): 140, Intensity: Moderate, Exercise limited by: None identified   Goals Addressed               This Visit's Progress     No current goals (pt-stated)         Depression Screen    01/19/2023    2:33 PM 12/14/2022    2:53 PM 08/12/2022    3:06 PM 02/10/2022    3:14 PM 10/20/2021    1:51 PM 10/06/2021    9:53 AM 09/30/2020    1:32 PM  PHQ 2/9 Scores  PHQ - 2 Score 0 0 0 0 0 0 0  PHQ- 9 Score   0  0      Fall Risk    01/19/2023    2:49 PM 08/12/2022    3:06 PM 02/10/2022    3:13 PM 10/20/2021    1:51 PM 10/06/2021    9:53 AM  [place] in the past year? 0 0 0 0 0  Number falls in past yr: 0 0 0 0 0  Injury with Fall? 0 0 0 0 0  Risk for fall due to : No Fall Risks No Fall Risks No Fall Risks    Follow up Falls prevention discussed Falls evaluation completed Falls evaluation completed      FALL RISK PREVENTION PERTAINING TO THE HOME:  Any stairs in or around the home? Yes  If so, are there any without handrails? No  Home free of loose throw rugs in walkways, pet beds, electrical cords, etc? Yes   Adequate lighting in your home to reduce risk of falls? Yes   ASSISTIVE DEVICES UTILIZED TO PREVENT FALLS:  Life alert? No  Use of a cane, walker or w/c? No  Grab bars in the bathroom? No  Shower chair or bench in shower? No  Elevated toilet seat or a handicapped toilet? No   TIMED UP AND GO:  Was the test performed? Yes .  Length of time to ambulate 10 feet: 10 sec.   Gait steady and fast without use of assistive device  Cognitive Function:        01/19/2023    2:51 PM  6CIT Screen  What Year? 4 points  What month? 3 points  What time? 3 points  Count back from 20 0 points  Months in reverse 4 points  Repeat phrase 10 points  Total Score 24 points    Immunizations Immunization History  Administered Date(s) Administered   Influenza,inj,Quad PF,6+ Mos 12/14/2022   PFIZER(Purple Top)SARS-COV-2 Vaccination 05/20/2020, 11/17/2020    TDAP status: Due, Education has been provided regarding the importance of this vaccine. Advised may receive this vaccine at local pharmacy or Health Dept. Aware to provide a copy of the vaccination record if obtained from local pharmacy or Health Dept. Verbalized acceptance and understanding.  Flu Vaccine status: Up to date    Covid-19 vaccine status: Completed vaccines  Qualifies for Shingles Vaccine? Yes   Zostavax completed No   Shingrix Completed?: No.    Education has been provided regarding the importance of this vaccine. Patient has been advised to call insurance company to determine out of pocket expense  if they have not yet received this vaccine. Advised may also receive vaccine at local pharmacy or Health Dept. Verbalized acceptance and understanding.  Screening Tests Health Maintenance  Topic Date Due   DTaP/Tdap/Td (1 - Tdap) Never done   COVID-19 Vaccine (3 - 2023-24 season) 02/04/2023 (Originally 07/29/2022)   Zoster Vaccines- Shingrix (1 of 2) 04/19/2023 (Originally 11/28/2021)   Fecal DNA (Cologuard)  01/20/2024  (Originally 11/28/2016)   Diabetic kidney evaluation - eGFR measurement  08/13/2023   Diabetic kidney evaluation - Urine ACR  12/15/2023   Medicare Annual Wellness (AWV)  01/20/2024   INFLUENZA VACCINE  Completed   Hepatitis C Screening  Completed   HIV Screening  Completed   HPV VACCINES  Aged Out    Health Maintenance  Health Maintenance Due  Topic Date Due   DTaP/Tdap/Td (1 - Tdap) Never done    Colorectal cancer screening: Referral to GI placed Patient deferred. Pt aware the office will call re: appt.  Lung Cancer Screening: (Low Dose CT Chest recommended if Age 43-80 years, 30 pack-year currently smoking OR have quit w/in 15years.) does not qualify.     Additional Screening:  Hepatitis C Screening: does qualify; Completed 06/30/21  Vision Screening: Recommended annual ophthalmology exams for early detection of glaucoma and other disorders of the eye. Is the patient up to date with their annual eye exam?  Yes  Who is the provider or what is the name of the office in which the patient attends annual eye exams? [facility] If pt is not established with a provider, would they like to be referred to a provider to establish care? No .   Dental Screening: Recommended annual dental exams for proper oral hygiene  Community Resource Referral / Chronic Care Management:  CRR required this visit?  No   CCM required this visit?  No      Plan:     I have personally reviewed and noted the following in the patient's chart:   Medical and social history Use of alcohol, tobacco or illicit drugs  Current medications and supplements including opioid prescriptions. Patient is not currently taking opioid prescriptions. Functional ability and status Nutritional status Physical activity Advanced directives List of other physicians Hospitalizations, surgeries, and ER visits in previous 12 months Vitals Screenings to include cognitive, depression, and falls Referrals and  appointments  In addition, I have reviewed and discussed with patient certain preventive protocols, quality metrics, and best practice recommendations. A written personalized care plan for preventive services as well as general preventive health recommendations were provided to patient.     Criselda Peaches, LPN   [id number]   Nurse Notes: Patient request f/u with concerns of rt side neck pain, coughing up blood.also severe headaches, Patient scored 24 on 6CIT exam.Appointment scheduled for 01/20/23 @ 2:20p

## 2023-01-19 NOTE — Patient Instructions (Signed)
[name] , Thank you for taking time to come for your Medicare Wellness Visit. I appreciate your ongoing commitment to your health goals. Please review the following plan we discussed and let me know if I can assist you in the future.   These are the goals we discussed:  Goals       No current goals (pt-stated)      Quit Smoking        This is a list of the screening recommended for you and due dates:  Health Maintenance  Topic Date Due   DTaP/Tdap/Td vaccine (1 - Tdap) Never done   COVID-19 Vaccine (3 - 2023-24 season) 02/04/2023*   Zoster (Shingles) Vaccine (1 of 2) 04/19/2023*   Cologuard (Stool DNA test)  01/20/2024*   Yearly kidney function blood test for diabetes  08/13/2023   Yearly kidney health urinalysis for diabetes  12/15/2023   Medicare Annual Wellness Visit  01/20/2024   Flu Shot  Completed   Hepatitis C Screening: USPSTF Recommendation to screen - Ages 18-79 yo.  Completed   HIV Screening  Completed   HPV Vaccine  Aged Out  *Topic was postponed. The date shown is not the original due date.    Advanced directives: Advance directive discussed with you today. Even though you declined this today, please call our office should you change your mind, and we can give you the proper paperwork for you to fill out.   Conditions/risks identified: None  Next appointment: Follow up in one year for your annual wellness visit   Preventive Care 40-64 Years, Male Preventive care refers to lifestyle choices and visits with your health care provider that can promote health and wellness. What does preventive care include? A yearly physical exam. This is also called an annual well check. Dental exams once or twice a year. Routine eye exams. Ask your health care provider how often you should have your eyes checked. Personal lifestyle choices, including: Daily care of your teeth and gums. Regular physical activity. Eating a healthy diet. Avoiding tobacco and drug use. Limiting  alcohol use. Practicing safe sex. Taking low-dose aspirin every day starting at age 55. What happens during an annual well check? The services and screenings done by your health care provider during your annual well check will depend on your age, overall health, lifestyle risk factors, and family history of disease. Counseling  Your health care provider may ask you questions about your: Alcohol use. Tobacco use. Drug use. Emotional well-being. Home and relationship well-being. Sexual activity. Eating habits. Work and work Statistician. Screening  You may have the following tests or measurements: Height, weight, and BMI. Blood pressure. Lipid and cholesterol levels. These may be checked every 5 years, or more frequently if you are over 78 years old. Skin check. Lung cancer screening. You may have this screening every year starting at age 74 if you have a 30-pack-year history of smoking and currently smoke or have quit within the past 15 years. Fecal occult blood test (FOBT) of the stool. You may have this test every year starting at age 49. Flexible sigmoidoscopy or colonoscopy. You may have a sigmoidoscopy every 5 years or a colonoscopy every 10 years starting at age 34. Prostate cancer screening. Recommendations will vary depending on your family history and other risks. Hepatitis C blood test. Hepatitis B blood test. Sexually transmitted disease (STD) testing. Diabetes screening. This is done by checking your blood sugar (glucose) after you have not eaten for a while (fasting). You  may have this done every 1-3 years. Discuss your test results, treatment options, and if necessary, the need for more tests with your health care provider. Vaccines  Your health care provider may recommend certain vaccines, such as: Influenza vaccine. This is recommended every year. Tetanus, diphtheria, and acellular pertussis (Tdap, Td) vaccine. You may need a Td booster every 10 years. Zoster vaccine.  You may need this after age 55. Pneumococcal 13-valent conjugate (PCV13) vaccine. You may need this if you have certain conditions and have not been vaccinated. Pneumococcal polysaccharide (PPSV23) vaccine. You may need one or two doses if you smoke cigarettes or if you have certain conditions. Talk to your health care provider about which screenings and vaccines you need and how often you need them. This information is not intended to replace advice given to you by your health care provider. Make sure you discuss any questions you have with your health care provider. Document Released: 12/11/2015 Document Revised: 08/03/2016 Document Reviewed: 09/15/2015 Elsevier Interactive Patient Education  2017 [place] Prevention in the Home Falls can cause injuries. They can happen to people of all ages. There are many things you can do to make your home safe and to help prevent falls. What can I do on the outside of my home? Regularly fix the edges of walkways and driveways and fix any cracks. Remove anything that might make you trip as you walk through a door, such as a raised step or threshold. Trim any bushes or trees on the path to your home. Use bright outdoor lighting. Clear any walking paths of anything that might make someone trip, such as rocks or tools. Regularly check to see if handrails are loose or broken. Make sure that both sides of any steps have handrails. Any raised decks and porches should have guardrails on the edges. Have any leaves, snow, or ice cleared regularly. Use sand or salt on walking paths during winter. Clean up any spills in your garage right away. This includes oil or grease spills. What can I do in the bathroom? Use night lights. Install grab bars by the toilet and in the tub and shower. Do not use towel bars as grab bars. Use non-skid mats or decals in the tub or shower. If you need to sit down in the shower, use a plastic, non-slip stool. Keep the  floor dry. Clean up any water that spills on the floor as soon as it happens. Remove soap buildup in the tub or shower regularly. Attach bath mats securely with double-sided non-slip rug tape. Do not have throw rugs and other things on the floor that can make you trip. What can I do in the bedroom? Use night lights. Make sure that you have a light by your bed that is easy to reach. Do not use any sheets or blankets that are too big for your bed. They should not hang down onto the floor. Have a firm chair that has side arms. You can use this for support while you get dressed. Do not have throw rugs and other things on the floor that can make you trip. What can I do in the kitchen? Clean up any spills right away. Avoid walking on wet floors. Keep items that you use a lot in easy-to-reach places. If you need to reach something above you, use a strong step stool that has a grab bar. Keep electrical cords out of the way. Do not use floor polish or wax that makes floors slippery.  If you must use wax, use non-skid floor wax. Do not have throw rugs and other things on the floor that can make you trip. What can I do with my stairs? Do not leave any items on the stairs. Make sure that there are handrails on both sides of the stairs and use them. Fix handrails that are broken or loose. Make sure that handrails are as long as the stairways. Check any carpeting to make sure that it is firmly attached to the stairs. Fix any carpet that is loose or worn. Avoid having throw rugs at the top or bottom of the stairs. If you do have throw rugs, attach them to the floor with carpet tape. Make sure that you have a light switch at the top of the stairs and the bottom of the stairs. If you do not have them, ask someone to add them for you. What else can I do to help prevent falls? Wear shoes that: Do not have high heels. Have rubber bottoms. Are comfortable and fit you well. Are closed at the toe. Do not wear  sandals. If you use a stepladder: Make sure that it is fully opened. Do not climb a closed stepladder. Make sure that both sides of the stepladder are locked into place. Ask someone to hold it for you, if possible. Clearly mark and make sure that you can see: Any grab bars or handrails. First and last steps. Where the edge of each step is. Use tools that help you move around (mobility aids) if they are needed. These include: Canes. Walkers. Scooters. Crutches. Turn on the lights when you go into a dark area. Replace any light bulbs as soon as they burn out. Set up your furniture so you have a clear path. Avoid moving your furniture around. If any of your floors are uneven, fix them. If there are any pets around you, be aware of where they are. Review your medicines with your doctor. Some medicines can make you feel dizzy. This can increase your chance of falling. Ask your doctor what other things that you can do to help prevent falls. This information is not intended to replace advice given to you by your health care provider. Make sure you discuss any questions you have with your health care provider. Document Released: 09/10/2009 Document Revised: 04/21/2016 Document Reviewed: 12/19/2014 Elsevier Interactive Patient Education  2017 [employer].

## 2023-01-20 ENCOUNTER — Ambulatory Visit (INDEPENDENT_AMBULATORY_CARE_PROVIDER_SITE_OTHER): Payer: Medicare Other | Admitting: Nurse Practitioner

## 2023-01-20 ENCOUNTER — Encounter: Payer: Self-pay | Admitting: Nurse Practitioner

## 2023-01-20 VITALS — BP 105/71 | HR 70 | Temp 97.2°F | Ht 72.0 in | Wt 173.4 lb

## 2023-01-20 DIAGNOSIS — R531 Weakness: Secondary | ICD-10-CM | POA: Insufficient documentation

## 2023-01-20 DIAGNOSIS — M79604 Pain in right leg: Secondary | ICD-10-CM | POA: Diagnosis not present

## 2023-01-20 DIAGNOSIS — R519 Headache, unspecified: Secondary | ICD-10-CM

## 2023-01-20 DIAGNOSIS — M542 Cervicalgia: Secondary | ICD-10-CM

## 2023-01-20 DIAGNOSIS — K068 Other specified disorders of gingiva and edentulous alveolar ridge: Secondary | ICD-10-CM | POA: Diagnosis not present

## 2023-01-20 MED ORDER — GABAPENTIN 300 MG PO CAPS: 300.0000 mg | ORAL_CAPSULE | Freq: Two times a day (BID) | ORAL | 1 refills | Status: DC

## 2023-01-20 MED ORDER — BIOTENE DRY MOUTH MT LIQD: 15.0000 mL | OROMUCOSAL | 3 refills | Status: AC | PRN

## 2023-01-20 MED ORDER — TIZANIDINE HCL 4 MG PO TABS: 4.0000 mg | ORAL_TABLET | Freq: Four times a day (QID) | ORAL | 0 refills | Status: DC | PRN

## 2023-01-20 MED ORDER — HYDROXYZINE HCL 10 MG PO TABS: 10.0000 mg | ORAL_TABLET | Freq: Three times a day (TID) | ORAL | 0 refills | Status: AC | PRN

## 2023-01-20 NOTE — Patient Instructions (Addendum)
1. Generalized weakness  - gabapentin (NEURONTIN) 300 MG capsule; Take 1 capsule (300 mg total) by mouth 2 (two) times daily.  Dispense: 180 capsule; Refill: 1  2. Right sided weakness  - gabapentin (NEURONTIN) 300 MG capsule; Take 1 capsule (300 mg total) by mouth 2 (two) times daily.  Dispense: 180 capsule; Refill: 1  3. Pain of right lower extremity  - gabapentin (NEURONTIN) 300 MG capsule; Take 1 capsule (300 mg total) by mouth 2 (two) times daily.  Dispense: 180 capsule; Refill: 1  4. Nonintractable headache, unspecified chronicity pattern, unspecified headache type  - Ambulatory referral to Neurology  5. Bleeding gums  - antiseptic oral rinse (BIOTENE) LIQD; 15 mLs by Mouth Rinse route as needed for dry mouth.  Dispense: 237 mL; Refill: 3  Follow up:  Follow up in 3 months

## 2023-01-20 NOTE — Assessment & Plan Note (Signed)
-   gabapentin (NEURONTIN) 300 MG capsule; Take 1 capsule (300 mg total) by mouth 2 (two) times daily.  Dispense: 180 capsule; Refill: 1  2. Right sided weakness  - gabapentin (NEURONTIN) 300 MG capsule; Take 1 capsule (300 mg total) by mouth 2 (two) times daily.  Dispense: 180 capsule; Refill: 1  3. Pain of right lower extremity  - gabapentin (NEURONTIN) 300 MG capsule; Take 1 capsule (300 mg total) by mouth 2 (two) times daily.  Dispense: 180 capsule; Refill: 1  4. Nonintractable headache, unspecified chronicity pattern, unspecified headache type  - Ambulatory referral to Neurology  5. Bleeding gums  - antiseptic oral rinse (BIOTENE) LIQD; 15 mLs by Mouth Rinse route as needed for dry mouth.  Dispense: 237 mL; Refill: 3  Follow up:  Follow up in 3 months

## 2023-01-20 NOTE — Progress Notes (Signed)
$'@Patient'I$  ID: Brandon Summers, male    DOB: 1971/01/22, 52 y.o.   MRN: [id number]  Chief Complaint  Patient presents with   coughing up blood    For about a year and neck pain on the right side    Referring provider: Fenton Foy, NP   HPI  Brandon Summers presents for follow up.  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    Patient presents today for acute visit.  He states that he continues to have ongoing right-sided neck pain and headaches.  We will refer him to neurology for chronic headaches.  Patient does have a history of CVA.  Patient states that he does get upset at times which causes a headache.  We will trial Vistaril to take as needed for anxiety.  Patient also complains of coughing up blood.  We have sent him to ENT for this.  Patient states that nothing was found.  He thinks that his gums may be bleeding at night.  Upon exam patient does have multiple cavities and broken teeth.  He does need to be set up with a dentist for routine dental care.  We have given him a list of local dentist hopefully can accept him.  We will try Biotene mouth rinse. Denies f/c/s, n/v/d, hemoptysis, PND, leg swelling Denies chest pain or edema     No Known Allergies  Immunization History  Administered Date(s) Administered   Influenza,inj,Quad PF,6+ Mos 12/14/2022   PFIZER(Purple Top)SARS-COV-2 Vaccination 05/20/2020, 11/17/2020    Past Medical History:  Diagnosis Date   CHF (congestive heart failure) (HCC)    Cough    CVA (cerebral vascular accident) 05/21/2019   Left thalamic stroke   HLD (hyperlipidemia) 05/23/2019   Mild vascular neurocognitive disorder 05/12/2020   Nonintractable headache 04/15/2020    Pacemaker    Right sided weakness 09/09/2019   Shortness of breath    Visual problems    Vitamin D deficiency 11/2019    Tobacco History: Social History   Tobacco Use  Smoking Status Former   Types: Cigarettes   Quit date: 05/21/2019   Years since quitting: 3.6  Smokeless Tobacco Never   Counseling given: Not Answered   Outpatient Encounter Medications as of 01/20/2023  Medication Sig   antiseptic oral rinse (BIOTENE) LIQD 15 mLs by Mouth Rinse route as needed for dry mouth.   aspirin EC 81 MG tablet Take 81 mg by mouth daily. Swallow whole.   atorvastatin (LIPITOR) 40 MG tablet Take 1 tablet (40 mg total) by mouth daily.   blood glucose meter kit and supplies KIT Dispense based on patient and insurance preference. Use up to four times daily as directed.   hydrOXYzine (ATARAX) 10 MG tablet Take 1 tablet (10 mg total) by mouth 3 (three) times daily as needed.   tiZANidine (ZANAFLEX) 4 MG tablet Take 1 tablet (4 mg total) by mouth every 6 (six) hours as needed for muscle spasms.   [DISCONTINUED] gabapentin (NEURONTIN) 300 MG capsule Take 1 capsule (300 mg total) by mouth 2 (two) times daily.   Cholecalciferol (VITAMIN D) 50 MCG (2000 UT) CAPS Take 1 capsule by mouth daily. (Patient not taking: Reported on 08/12/2022)   gabapentin (NEURONTIN) 300 MG capsule Take 1 capsule (300 mg total) by mouth 2 (two) times daily.   No facility-administered  encounter medications on file as of 01/20/2023.     Review of Systems  Review of Systems  Constitutional: Negative.   HENT: Negative.    Cardiovascular: Negative.   Gastrointestinal: Negative.   Allergic/Immunologic: Negative.   Neurological: Negative.   Psychiatric/Behavioral: Negative.         Physical Exam  BP 105/71   Pulse 70   Temp (!) 97.2 F (36.2 C)   Ht 6' (1.829 m)   Wt 173 lb 6.4 oz (78.7 kg)   SpO2 100%   BMI 23.52 kg/m   Wt Readings from Last 5 Encounters:  01/20/23 173 lb 6.4 oz (78.7 kg)  01/19/23 177 lb  3.2 oz (80.4 kg)  12/14/22 178 lb 9.6 oz (81 kg)  09/05/22 172 lb 12.8 oz (78.4 kg)  08/12/22 168 lb 9.6 oz (76.5 kg)     Physical Exam Vitals and nursing note reviewed.  Constitutional:      General: He is not in acute distress.    Appearance: He is well-developed.  Cardiovascular:     Rate and Rhythm: Normal rate and regular rhythm.  Pulmonary:     Effort: Pulmonary effort is normal.     Breath sounds: Normal breath sounds.  Skin:    General: Skin is warm and dry.  Neurological:     Mental Status: He is alert and oriented to person, place, and time.      Lab Results:  CBC    Component Value Date/Time   WBC 7.5 09/07/2022 1411   RBC 7.16 (H) 09/07/2022 1411   HGB 16.3 09/07/2022 1411   HGB 16.1 08/12/2022 1530   HCT 50.1 09/07/2022 1411   HCT 50.1 08/12/2022 1530   PLT 247 09/07/2022 1411   PLT 273 08/12/2022 1530   MCV 70.0 (L) 09/07/2022 1411   MCV 71 (L) 08/12/2022 1530   MCH 22.8 (L) 09/07/2022 1411   MCHC 32.5 09/07/2022 1411   RDW 18.6 (H) 09/07/2022 1411   RDW 18.4 (H) 08/12/2022 1530   LYMPHSABS 2.5 09/07/2022 1411   LYMPHSABS 2.1 06/30/2021 1149   MONOABS 0.5 09/07/2022 1411   EOSABS 0.1 09/07/2022 1411   EOSABS 0.1 06/30/2021 1149   BASOSABS 0.0 09/07/2022 1411   BASOSABS 0.0 06/30/2021 1149    BMET    Component Value Date/Time   NA 139 08/12/2022 1530   K 4.3 08/12/2022 1530   CL 101 08/12/2022 1530   CO2 23 08/12/2022 1530   GLUCOSE 95 08/12/2022 1530   GLUCOSE 113 (H) 10/13/2019 0014   BUN 12 08/12/2022 1530   CREATININE 1.07 08/12/2022 1530   CALCIUM 9.4 08/12/2022 1530   GFRNONAA 91 09/30/2020 1442   GFRAA 105 09/30/2020 1442    BNP No results found for: "BNP"  ProBNP No results found for: "PROBNP"  Imaging: CUP PACEART REMOTE DEVICE CHECK  Result Date: 01/15/2023 ILR summary report received. Battery status OK. Normal device function. No new symptom, tachy, brady, or pause episodes. No new AF episodes. AF burden is 0% of  the time.  Monthly summary reports and ROV/PRN Kathy Breach, RN, CCDS, [employer]    Assessment & Plan:   Generalized weakness - gabapentin (NEURONTIN) 300 MG capsule; Take 1 capsule (300 mg total) by mouth 2 (two) times daily.  Dispense: 180 capsule; Refill: 1  2. Right sided weakness  - gabapentin (NEURONTIN) 300 MG capsule; Take 1 capsule (300 mg total) by mouth 2 (two) times daily.  Dispense: 180 capsule; Refill: 1  3.  Pain of right lower extremity  - gabapentin (NEURONTIN) 300 MG capsule; Take 1 capsule (300 mg total) by mouth 2 (two) times daily.  Dispense: 180 capsule; Refill: 1  4. Nonintractable headache, unspecified chronicity pattern, unspecified headache type  - Ambulatory referral to Neurology  5. Bleeding gums  - antiseptic oral rinse (BIOTENE) LIQD; 15 mLs by Mouth Rinse route as needed for dry mouth.  Dispense: 237 mL; Refill: 3  Follow up:  Follow up in 3 months     Fenton Foy, NP 01/20/2023

## 2023-01-31 NOTE — Progress Notes (Signed)
Carelink Summary Report / Loop Recorder 

## 2023-02-20 ENCOUNTER — Ambulatory Visit (INDEPENDENT_AMBULATORY_CARE_PROVIDER_SITE_OTHER): Payer: Medicare Other

## 2023-02-20 DIAGNOSIS — I639 Cerebral infarction, unspecified: Secondary | ICD-10-CM

## 2023-02-20 LAB — CUP PACEART REMOTE DEVICE CHECK
Date Time Interrogation Session: 20240324231544
Implantable Pulse Generator Implant Date: 20200828

## 2023-03-02 NOTE — Progress Notes (Signed)
Carelink Summary Report / Loop Recorder 

## 2023-03-24 ENCOUNTER — Ambulatory Visit (INDEPENDENT_AMBULATORY_CARE_PROVIDER_SITE_OTHER): Payer: Medicare Other

## 2023-03-24 DIAGNOSIS — I639 Cerebral infarction, unspecified: Secondary | ICD-10-CM

## 2023-03-27 LAB — CUP PACEART REMOTE DEVICE CHECK
Date Time Interrogation Session: 20240426230307
Implantable Pulse Generator Implant Date: 20200828

## 2023-04-04 NOTE — Progress Notes (Signed)
Carelink Summary Report / Loop Recorder 

## 2023-04-18 NOTE — Progress Notes (Signed)
Carelink Summary Report / Loop Recorder 

## 2023-04-26 ENCOUNTER — Ambulatory Visit (INDEPENDENT_AMBULATORY_CARE_PROVIDER_SITE_OTHER): Payer: Medicare Other

## 2023-04-26 DIAGNOSIS — I639 Cerebral infarction, unspecified: Secondary | ICD-10-CM

## 2023-04-27 LAB — CUP PACEART REMOTE DEVICE CHECK
Date Time Interrogation Session: 20240529231038
Implantable Pulse Generator Implant Date: 20200828

## 2023-05-18 NOTE — Progress Notes (Signed)
Carelink Summary Report / Loop Recorder 

## 2023-05-29 ENCOUNTER — Ambulatory Visit (INDEPENDENT_AMBULATORY_CARE_PROVIDER_SITE_OTHER): Payer: Medicare Other

## 2023-05-29 DIAGNOSIS — I639 Cerebral infarction, unspecified: Secondary | ICD-10-CM | POA: Diagnosis not present

## 2023-05-30 LAB — CUP PACEART REMOTE DEVICE CHECK
Date Time Interrogation Session: 20240701230927
Implantable Pulse Generator Implant Date: 20200828

## 2023-06-14 ENCOUNTER — Ambulatory Visit: Payer: Self-pay | Admitting: Nurse Practitioner

## 2023-06-15 ENCOUNTER — Ambulatory Visit: Payer: Self-pay | Admitting: Nurse Practitioner

## 2023-06-19 NOTE — Progress Notes (Signed)
Carelink Summary Report / Loop Recorder 

## 2023-06-23 ENCOUNTER — Ambulatory Visit (INDEPENDENT_AMBULATORY_CARE_PROVIDER_SITE_OTHER): Payer: Medicare Other | Admitting: Nurse Practitioner

## 2023-06-23 ENCOUNTER — Encounter: Payer: Self-pay | Admitting: Nurse Practitioner

## 2023-06-23 VITALS — BP 113/71 | HR 77 | Temp 97.1°F | Wt 174.2 lb

## 2023-06-23 DIAGNOSIS — R531 Weakness: Secondary | ICD-10-CM

## 2023-06-23 DIAGNOSIS — E119 Type 2 diabetes mellitus without complications: Secondary | ICD-10-CM

## 2023-06-23 DIAGNOSIS — E785 Hyperlipidemia, unspecified: Secondary | ICD-10-CM | POA: Diagnosis not present

## 2023-06-23 DIAGNOSIS — M79604 Pain in right leg: Secondary | ICD-10-CM | POA: Diagnosis not present

## 2023-06-23 LAB — POCT GLYCOSYLATED HEMOGLOBIN (HGB A1C): Hemoglobin A1C: 6.6 % — AB (ref 4.0–5.6)

## 2023-06-23 MED ORDER — ATORVASTATIN CALCIUM 40 MG PO TABS: 40.0000 mg | ORAL_TABLET | Freq: Every day | ORAL | 1 refills | Status: DC

## 2023-06-23 MED ORDER — GABAPENTIN 300 MG PO CAPS: 300.0000 mg | ORAL_CAPSULE | Freq: Two times a day (BID) | ORAL | 1 refills | Status: DC

## 2023-06-23 NOTE — Patient Instructions (Signed)
1. Diabetes mellitus type 2, diet-controlled (HCC)  - POCT glycosylated hemoglobin (Hb A1C) - CBC - Comprehensive metabolic panel  2. Hyperlipidemia, unspecified hyperlipidemia type  - atorvastatin (LIPITOR) 40 MG tablet; Take 1 tablet (40 mg total) by mouth daily.  Dispense: 90 tablet; Refill: 1 - Lipid Panel  3. Generalized weakness  - gabapentin (NEURONTIN) 300 MG capsule; Take 1 capsule (300 mg total) by mouth 2 (two) times daily.  Dispense: 180 capsule; Refill: 1  4. Right sided weakness  - gabapentin (NEURONTIN) 300 MG capsule; Take 1 capsule (300 mg total) by mouth 2 (two) times daily.  Dispense: 180 capsule; Refill: 1  5. Pain of right lower extremity  - gabapentin (NEURONTIN) 300 MG capsule; Take 1 capsule (300 mg total) by mouth 2 (two) times daily.  Dispense: 180 capsule; Refill: 1  Follow up:  Follow up in 3 months

## 2023-06-23 NOTE — Assessment & Plan Note (Signed)
-   POCT glycosylated hemoglobin (Hb A1C) - CBC - Comprehensive metabolic panel  2. Hyperlipidemia, unspecified hyperlipidemia type  - atorvastatin (LIPITOR) 40 MG tablet; Take 1 tablet (40 mg total) by mouth daily.  Dispense: 90 tablet; Refill: 1 - Lipid Panel  3. Generalized weakness  - gabapentin (NEURONTIN) 300 MG capsule; Take 1 capsule (300 mg total) by mouth 2 (two) times daily.  Dispense: 180 capsule; Refill: 1  4. Right sided weakness  - gabapentin (NEURONTIN) 300 MG capsule; Take 1 capsule (300 mg total) by mouth 2 (two) times daily.  Dispense: 180 capsule; Refill: 1  5. Pain of right lower extremity  - gabapentin (NEURONTIN) 300 MG capsule; Take 1 capsule (300 mg total) by mouth 2 (two) times daily.  Dispense: 180 capsule; Refill: 1  Follow up:  Follow up in 3 months

## 2023-06-23 NOTE — Progress Notes (Signed)
@Patient  ID: Brandon Summers, male    DOB: 1971/03/02, 52 y.o.   MRN: [id number]  Chief Complaint  Patient presents with   Follow-up    Over all health    Referring provider: Ivonne Andrew, NP   HPI  Brandon Summers is a 52 y.o. male who presents for follow-up of Type 2 diabetes mellitus.   Patient is not checking home blood sugars.   Home blood sugar records: patient does not check sugars How often is blood sugars being checked: N/A Current symptoms/problems include  dizzy  and have been unchanged. Daily foot checks: no     Any foot concerns: none  Last eye exam: over one year Exercise:  staying active every day A1C in office today is 6.6      No Known Allergies  Immunization History  Administered Date(s) Administered   Influenza,inj,Quad PF,6+ Mos 12/14/2022   PFIZER(Purple Top)SARS-COV-2 Vaccination 05/20/2020, 11/17/2020    Past Medical History:  Diagnosis Date   CHF (congestive heart failure) (HCC)    Cough    CVA (cerebral vascular accident) 05/21/2019   Left thalamic stroke   HLD (hyperlipidemia) 05/23/2019   Mild vascular neurocognitive disorder 05/12/2020   Nonintractable headache 04/15/2020   Pacemaker    Right sided weakness 09/09/2019   Shortness of breath    Visual problems    Vitamin D deficiency 11/2019    Tobacco History: Social History   Tobacco Use  Smoking Status Former   Current packs/day: 0.00   Types: Cigarettes   Quit date: 05/21/2019   Years since quitting: 4.0  Smokeless Tobacco Never   Counseling given: Not Answered   Outpatient Encounter Medications as of 06/23/2023  Medication Sig   aspirin EC 81 MG tablet Take 81 mg by mouth daily. Swallow whole.   blood glucose meter kit and supplies KIT Dispense based on patient and insurance preference. Use up to four times daily as directed.   [DISCONTINUED] atorvastatin (LIPITOR) 40 MG tablet Take 1 tablet (40 mg total) by mouth daily.   [DISCONTINUED] gabapentin (NEURONTIN) 300 MG  capsule Take 1 capsule (300 mg total) by mouth 2 (two) times daily.   atorvastatin (LIPITOR) 40 MG tablet Take 1 tablet (40 mg total) by mouth daily.   Cholecalciferol (VITAMIN D) 50 MCG (2000 UT) CAPS Take 1 capsule by mouth daily. (Patient not taking: Reported on 08/12/2022)   gabapentin (NEURONTIN) 300 MG capsule Take 1 capsule (300 mg total) by mouth 2 (two) times daily.   hydrOXYzine (ATARAX) 10 MG tablet Take 1 tablet (10 mg total) by mouth 3 (three) times daily as needed. (Patient not taking: Reported on 06/23/2023)   tiZANidine (ZANAFLEX) 4 MG tablet Take 1 tablet (4 mg total) by mouth every 6 (six) hours as needed for muscle spasms. (Patient not taking: Reported on 06/23/2023)   No facility-administered encounter medications on file as of 06/23/2023.     Review of Systems  Review of Systems  Constitutional: Negative.   HENT: Negative.    Cardiovascular: Negative.   Gastrointestinal: Negative.   Allergic/Immunologic: Negative.   Neurological: Negative.   Psychiatric/Behavioral: Negative.         Physical Exam  BP 113/71   Pulse 77   Temp (!) 97.1 F (36.2 C)   Wt 174 lb 3.2 oz (79 kg)   SpO2 100%   BMI 23.63 kg/m   Wt Readings from Last 5 Encounters:  06/23/23 174 lb 3.2 oz (79 kg)  01/20/23 173 lb 6.4 oz (78.7 kg)  01/19/23 177 lb 3.2 oz (80.4 kg)  12/14/22 178 lb 9.6 oz (81 kg)  09/05/22 172 lb 12.8 oz (78.4 kg)     Physical Exam Vitals and nursing note reviewed.  Constitutional:      General: He is not in acute distress.    Appearance: He is well-developed.  Cardiovascular:     Rate and Rhythm: Normal rate and regular rhythm.  Pulmonary:     Effort: Pulmonary effort is normal.     Breath sounds: Normal breath sounds.  Skin:    General: Skin is warm and dry.  Neurological:     Mental Status: He is alert and oriented to person, place, and time.      Lab Results:  CBC    Component Value Date/Time   WBC 7.5 09/07/2022 1411   RBC 7.16 (H)  09/07/2022 1411   HGB 16.3 09/07/2022 1411   HGB 16.1 08/12/2022 1530   HCT 50.1 09/07/2022 1411   HCT 50.1 08/12/2022 1530   PLT 247 09/07/2022 1411   PLT 273 08/12/2022 1530   MCV 70.0 (L) 09/07/2022 1411   MCV 71 (L) 08/12/2022 1530   MCH 22.8 (L) 09/07/2022 1411   MCHC 32.5 09/07/2022 1411   RDW 18.6 (H) 09/07/2022 1411   RDW 18.4 (H) 08/12/2022 1530   LYMPHSABS 2.5 09/07/2022 1411   LYMPHSABS 2.1 06/30/2021 1149   MONOABS 0.5 09/07/2022 1411   EOSABS 0.1 09/07/2022 1411   EOSABS 0.1 06/30/2021 1149   BASOSABS 0.0 09/07/2022 1411   BASOSABS 0.0 06/30/2021 1149    BMET    Component Value Date/Time   NA 139 08/12/2022 1530   K 4.3 08/12/2022 1530   CL 101 08/12/2022 1530   CO2 23 08/12/2022 1530   GLUCOSE 95 08/12/2022 1530   GLUCOSE 113 (H) 10/13/2019 0014   BUN 12 08/12/2022 1530   CREATININE 1.07 08/12/2022 1530   CALCIUM 9.4 08/12/2022 1530   GFRNONAA 91 09/30/2020 1442   GFRAA 105 09/30/2020 1442    BNP No results found for: "BNP"  ProBNP No results found for: "PROBNP"  Imaging: CUP PACEART REMOTE DEVICE CHECK  Result Date: 05/30/2023 ILR summary report received. Battery status OK. Normal device function. No new symptom, tachy, brady, or pause episodes. No new AF episodes. Monthly summary reports and ROV/PRN LA, CV[facility]    Assessment & Plan:   Diabetes mellitus type 2, diet-controlled (HCC) - POCT glycosylated hemoglobin (Hb A1C) - CBC - Comprehensive metabolic panel  2. Hyperlipidemia, unspecified hyperlipidemia type  - atorvastatin (LIPITOR) 40 MG tablet; Take 1 tablet (40 mg total) by mouth daily.  Dispense: 90 tablet; Refill: 1 - Lipid Panel  3. Generalized weakness  - gabapentin (NEURONTIN) 300 MG capsule; Take 1 capsule (300 mg total) by mouth 2 (two) times daily.  Dispense: 180 capsule; Refill: 1  4. Right sided weakness  - gabapentin (NEURONTIN) 300 MG capsule; Take 1 capsule (300 mg total) by mouth 2 (two) times daily.  Dispense: 180  capsule; Refill: 1  5. Pain of right lower extremity  - gabapentin (NEURONTIN) 300 MG capsule; Take 1 capsule (300 mg total) by mouth 2 (two) times daily.  Dispense: 180 capsule; Refill: 1  Follow up:  Follow up in 3 months     Ivonne Andrew, NP 06/23/2023

## 2023-06-27 NOTE — Progress Notes (Unsigned)
NEUROLOGY FOLLOW UP OFFICE NOTE  Brandon Summers [id number]  Assessment/Plan:   Migraine without aura, without status migrainosus, not intractable  Start nortriptyline 10mg  at bedtime.  We can increase to 25mg  in 4 weeks if needed For acute treatment:  Tylenol (with or without caffeine) and baclofen 10mg  Limit use of pain relievers to no more than 2 days out of week to prevent risk of rebound or medication-overuse headache. Keep headache diary Follow up 6 months.  Subjective:  Brandon Summers is a 52 year old right-handed man with HLD, former smoker and history of left thalamic stroke who presents today now for headache.    He had large left thalamic stroke on 05/21/2019 presenting as blurred vision, headache, and right sided numbness.  MRI of brain, however, showed an acute large left thalamic infarct as well as significant chronic small vessel ischemic changes.  CTA of head and neck revealed no aneurysm or emergent large vessel stenosis or occlusion. 2D echo demonstrated EF 55-60% with no cardiac source of embolus.  TEE demonstrated no PFO or other cardiac source of embolus. On ASA and atorvastatin.  With implantable loop recorder.   Following discharge, he reported some blurred vision.  Right hand hurts but all extremities feel "tired".  He reports that he is drooling from both sides of his face.  He says face feels "different" but he denies numbness and tingling sensation.  He says when he talks, he tends to talk slowly because if he talks, he gets a left sided headache.  He underwent neuropsychological testing on 05/12/2020 which demonstrated mild vascular neurocognitive disorder with deficits believed to hinder patient's ability to maintain competitive employment.  He is now on disability.   He continues to have headaches since the stroke.  They are triggered when he gets upset.  They are a severe frontal pressure pain.  They are associated with nausea and photophobia.  Cannot function.  They  last 2 days and occur 4 times a month.  He has some right sided neck pain.    Current acute treatment:  Tylenol Current preventative treatment:  None but taking gabapentin 300mg  BID for neuropathic pain  Past acute treatment:  tizanidine 4mg  PRN Past preventative treatment:  none   PAST MEDICAL HISTORY: Past Medical History:  Diagnosis Date   CHF (congestive heart failure) (HCC)    Cough    CVA (cerebral vascular accident) 05/21/2019   Left thalamic stroke   HLD (hyperlipidemia) 05/23/2019   Mild vascular neurocognitive disorder 05/12/2020   Nonintractable headache 04/15/2020   Pacemaker    Right sided weakness 09/09/2019   Shortness of breath    Visual problems    Vitamin D deficiency 11/2019    MEDICATIONS: Current Outpatient Medications on File Prior to Visit  Medication Sig Dispense Refill   aspirin EC 81 MG tablet Take 81 mg by mouth daily. Swallow whole.     atorvastatin (LIPITOR) 40 MG tablet Take 1 tablet (40 mg total) by mouth daily. 90 tablet 1   blood glucose meter kit and supplies KIT Dispense based on patient and insurance preference. Use up to four times daily as directed. 1 each 0   Cholecalciferol (VITAMIN D) 50 MCG (2000 UT) CAPS Take 1 capsule by mouth daily. (Patient not taking: Reported on 08/12/2022)     gabapentin (NEURONTIN) 300 MG capsule Take 1 capsule (300 mg total) by mouth 2 (two) times daily. 180 capsule 1   hydrOXYzine (ATARAX) 10 MG tablet Take 1 tablet (10 mg total)  by mouth 3 (three) times daily as needed. (Patient not taking: Reported on 06/23/2023) 30 tablet 0   tiZANidine (ZANAFLEX) 4 MG tablet Take 1 tablet (4 mg total) by mouth every 6 (six) hours as needed for muscle spasms. (Patient not taking: Reported on 06/23/2023) 30 tablet 0   No current facility-administered medications on file prior to visit.    ALLERGIES: No Known Allergies  FAMILY HISTORY: Family History  Problem Relation Age of Onset   Hypertension Mother    Stroke Mother     Colon cancer Neg Hx    Esophageal cancer Neg Hx    Pancreatic cancer Neg Hx    Stomach cancer Neg Hx       Objective:  Blood pressure 114/79, pulse 72, height 6' (1.829 m), weight 172 lb 3.2 oz (78.1 kg), SpO2 100%. General: No acute distress.  Patient appears well-groomed.   Head:  Normocephalic/atraumatic Eyes:  Fundi examined but not visualized Neck: supple, no paraspinal tenderness, full range of motion Heart:  Regular rate and rhythm Lungs:  Clear to auscultation bilaterally Back: No paraspinal tenderness Neurological Exam: alert and oriented.  Speech fluent and not dysarthric, language intact.  CN II-XII intact. Bulk and tone normal, muscle strength 5/5 throughout.  Sensation to light touch feels slightly "heavier" on left.  Deep tendon reflexes 2+ throughout, toes downgoing.  Finger to nose testing intact.  Gait normal, Romberg negative.   Shon Millet, DO  CC: Angus Seller, NP

## 2023-06-28 ENCOUNTER — Encounter: Payer: Self-pay | Admitting: Neurology

## 2023-06-28 ENCOUNTER — Ambulatory Visit (INDEPENDENT_AMBULATORY_CARE_PROVIDER_SITE_OTHER): Payer: Medicare Other | Admitting: Neurology

## 2023-06-28 VITALS — BP 114/79 | HR 72 | Ht 72.0 in | Wt 172.2 lb

## 2023-06-28 DIAGNOSIS — G43009 Migraine without aura, not intractable, without status migrainosus: Secondary | ICD-10-CM

## 2023-06-28 MED ORDER — NORTRIPTYLINE HCL 10 MG PO CAPS: 10.0000 mg | ORAL_CAPSULE | Freq: Every day | ORAL | 5 refills | Status: DC

## 2023-06-28 MED ORDER — BACLOFEN 10 MG PO TABS: 10.0000 mg | ORAL_TABLET | Freq: Three times a day (TID) | ORAL | 5 refills | Status: DC | PRN

## 2023-06-28 NOTE — Patient Instructions (Signed)
Start nortriptyline 10mg  at bedtime.  If no improvement in 4 weeks, contact me and we can increase dose At earliest onset of headache, take baclofen (caution for drowsiness) with acetaminophen (may take with caffeine as coffee or take over the counter acetaminophen-caffeine combination pill).  Limit use to no more than 2 days out of week to prevent risk of rebound or medication-overuse headache. Keep headache diary Follow up 5 months.

## 2023-07-02 LAB — CUP PACEART REMOTE DEVICE CHECK
Date Time Interrogation Session: 20240803230159
Implantable Pulse Generator Implant Date: 20200828

## 2023-07-03 ENCOUNTER — Ambulatory Visit (INDEPENDENT_AMBULATORY_CARE_PROVIDER_SITE_OTHER): Payer: Medicare Other

## 2023-07-03 DIAGNOSIS — I639 Cerebral infarction, unspecified: Secondary | ICD-10-CM | POA: Diagnosis not present

## 2023-07-18 NOTE — Progress Notes (Signed)
Carelink Summary Report / Loop Recorder 

## 2023-08-07 ENCOUNTER — Ambulatory Visit: Payer: Medicare Other

## 2023-08-07 DIAGNOSIS — I639 Cerebral infarction, unspecified: Secondary | ICD-10-CM

## 2023-08-07 LAB — CUP PACEART REMOTE DEVICE CHECK
Date Time Interrogation Session: 20240905230534
Implantable Pulse Generator Implant Date: 20200828

## 2023-08-24 NOTE — Progress Notes (Signed)
Carelink Summary Report / Loop Recorder 

## 2023-09-11 ENCOUNTER — Ambulatory Visit (INDEPENDENT_AMBULATORY_CARE_PROVIDER_SITE_OTHER): Payer: Medicare Other

## 2023-09-11 DIAGNOSIS — I639 Cerebral infarction, unspecified: Secondary | ICD-10-CM | POA: Diagnosis not present

## 2023-09-11 LAB — CUP PACEART REMOTE DEVICE CHECK
Date Time Interrogation Session: 20241013231110
Implantable Pulse Generator Implant Date: 20200828

## 2023-09-27 ENCOUNTER — Ambulatory Visit (INDEPENDENT_AMBULATORY_CARE_PROVIDER_SITE_OTHER): Payer: Medicare Other | Admitting: Nurse Practitioner

## 2023-09-27 ENCOUNTER — Encounter: Payer: Self-pay | Admitting: Nurse Practitioner

## 2023-09-27 VITALS — BP 112/81 | HR 60 | Temp 98.7°F | Resp 14 | Ht 72.0 in | Wt 174.0 lb

## 2023-09-27 DIAGNOSIS — E785 Hyperlipidemia, unspecified: Secondary | ICD-10-CM

## 2023-09-27 DIAGNOSIS — E119 Type 2 diabetes mellitus without complications: Secondary | ICD-10-CM

## 2023-09-27 LAB — POCT GLYCOSYLATED HEMOGLOBIN (HGB A1C): HbA1c, POC (controlled diabetic range): 6.4 % (ref 0.0–7.0)

## 2023-09-27 MED ORDER — ATORVASTATIN CALCIUM 40 MG PO TABS
40.0000 mg | ORAL_TABLET | Freq: Every day | ORAL | 1 refills | Status: DC
Start: 2023-09-27 — End: 2024-09-16

## 2023-09-27 NOTE — Progress Notes (Signed)
Carelink Summary Report / Loop Recorder 

## 2023-09-27 NOTE — Patient Instructions (Addendum)
1. Diabetes mellitus type 2, diet-controlled (HCC)  - POCT glycosylated hemoglobin (Hb A1C)   2. Hyperlipidemia, unspecified hyperlipidemia type  - atorvastatin (LIPITOR) 40 MG tablet; Take 1 tablet (40 mg total) by mouth daily.  Dispense: 90 tablet; Refill: 1   Follow up:  Follow up in 6 months

## 2023-09-27 NOTE — Progress Notes (Signed)
Subjective   Patient ID: Brandon Summers, male    DOB: Apr 21, 1971, 52 y.o.   MRN: [id number]  Chief Complaint  Patient presents with   Follow-up    Referring provider: Ivonne Andrew, NP  Macus Dille is a 52 y.o. male with Past Medical History: No date: CHF (congestive heart failure) (HCC) No date: Cough 05/21/2019: CVA (cerebral vascular accident)     Comment:  Left thalamic stroke 05/23/2019: HLD (hyperlipidemia) 05/12/2020: Mild vascular neurocognitive disorder 04/15/2020: Nonintractable headache No date: Pacemaker 09/09/2019: Right sided weakness No date: Shortness of breath No date: Visual problems 11/2019: Vitamin D deficiency   HPI  Patient is not checking home blood sugars.   Home blood sugar records: patient does not check sugars How often is blood sugars being checked: N/A Current symptoms/problems include  dizzy  and have been unchanged. Daily foot checks: no     Any foot concerns: none  Last eye exam: over one year Exercise:  staying active every day A1C in office today is 6.5   No Known Allergies  Immunization History  Administered Date(s) Administered   Influenza,inj,Quad PF,6+ Mos 12/14/2022   PFIZER(Purple Top)SARS-COV-2 Vaccination 05/20/2020, 11/17/2020    Tobacco History: Social History   Tobacco Use  Smoking Status Former   Current packs/day: 0.00   Types: Cigarettes   Quit date: 05/21/2019   Years since quitting: 4.3  Smokeless Tobacco Never   Counseling given: Not Answered   Outpatient Encounter Medications as of 09/27/2023  Medication Sig   aspirin EC 81 MG tablet Take 81 mg by mouth daily. Swallow whole.   baclofen (LIORESAL) 10 MG tablet Take 1 tablet (10 mg total) by mouth 3 (three) times daily as needed for muscle spasms.   blood glucose meter kit and supplies KIT Dispense based on patient and insurance preference. Use up to four times daily as directed.   gabapentin (NEURONTIN) 300 MG capsule Take 1 capsule (300 mg total)  by mouth 2 (two) times daily.   hydrOXYzine (ATARAX) 10 MG tablet Take 1 tablet (10 mg total) by mouth 3 (three) times daily as needed.   nortriptyline (PAMELOR) 10 MG capsule Take 1 capsule (10 mg total) by mouth at bedtime.   [DISCONTINUED] atorvastatin (LIPITOR) 40 MG tablet Take 1 tablet (40 mg total) by mouth daily.   atorvastatin (LIPITOR) 40 MG tablet Take 1 tablet (40 mg total) by mouth daily.   Cholecalciferol (VITAMIN D) 50 MCG (2000 UT) CAPS Take 1 capsule by mouth daily. (Patient not taking: Reported on 06/28/2023)   No facility-administered encounter medications on file as of 09/27/2023.    Review of Systems  Review of Systems  Constitutional: Negative.   HENT: Negative.    Cardiovascular: Negative.   Gastrointestinal: Negative.   Allergic/Immunologic: Negative.   Neurological: Negative.   Psychiatric/Behavioral: Negative.       Objective:   BP 112/81 (BP Location: Right Arm, Patient Position: Sitting, Cuff Size: Normal)   Pulse 60   Temp 98.7 F (37.1 C)   Resp 14   Ht 6' (1.829 m)   Wt 174 lb (78.9 kg)   SpO2 99%   BMI 23.60 kg/m   Wt Readings from Last 5 Encounters:  09/27/23 174 lb (78.9 kg)  06/28/23 172 lb 3.2 oz (78.1 kg)  06/23/23 174 lb 3.2 oz (79 kg)  01/20/23 173 lb 6.4 oz (78.7 kg)  01/19/23 177 lb 3.2 oz (80.4 kg)     Physical Exam Vitals and nursing note reviewed.  Constitutional:  General: He is not in acute distress.    Appearance: He is well-developed.  Cardiovascular:     Rate and Rhythm: Normal rate and regular rhythm.  Pulmonary:     Effort: Pulmonary effort is normal.     Breath sounds: Normal breath sounds.  Skin:    General: Skin is warm and dry.  Neurological:     Mental Status: He is alert and oriented to person, place, and time.       Assessment & Plan:   Diabetes mellitus type 2, diet-controlled (HCC) -     POCT glycosylated hemoglobin (Hb A1C)  Hyperlipidemia, unspecified hyperlipidemia type -      Atorvastatin Calcium; Take 1 tablet (40 mg total) by mouth daily.  Dispense: 90 tablet; Refill: 1     Return in about 6 months (around 03/27/2024).   Ivonne Andrew, NP 09/27/2023

## 2023-10-16 ENCOUNTER — Ambulatory Visit (INDEPENDENT_AMBULATORY_CARE_PROVIDER_SITE_OTHER): Payer: Medicare Other

## 2023-10-16 DIAGNOSIS — I639 Cerebral infarction, unspecified: Secondary | ICD-10-CM | POA: Diagnosis not present

## 2023-10-16 LAB — CUP PACEART REMOTE DEVICE CHECK
Date Time Interrogation Session: 20241117230216
Implantable Pulse Generator Implant Date: 20200828

## 2023-11-10 NOTE — Progress Notes (Signed)
Carelink Summary Report / Loop Recorder 

## 2023-11-20 ENCOUNTER — Ambulatory Visit (INDEPENDENT_AMBULATORY_CARE_PROVIDER_SITE_OTHER): Payer: Medicare Other

## 2023-11-20 DIAGNOSIS — I639 Cerebral infarction, unspecified: Secondary | ICD-10-CM | POA: Diagnosis not present

## 2023-11-21 LAB — CUP PACEART REMOTE DEVICE CHECK
Date Time Interrogation Session: 20241222230021
Implantable Pulse Generator Implant Date: 20200828

## 2023-12-25 ENCOUNTER — Ambulatory Visit: Payer: Medicare Other

## 2023-12-25 DIAGNOSIS — I639 Cerebral infarction, unspecified: Secondary | ICD-10-CM

## 2023-12-25 LAB — CUP PACEART REMOTE DEVICE CHECK
Date Time Interrogation Session: 20250126230309
Implantable Pulse Generator Implant Date: 20200828

## 2023-12-29 ENCOUNTER — Encounter: Payer: Self-pay | Admitting: Cardiovascular Disease

## 2024-01-01 NOTE — Addendum Note (Signed)
Addended by: Geralyn Flash D on: 01/01/2024 09:47 AM   Modules accepted: Orders

## 2024-01-01 NOTE — Progress Notes (Signed)
 [facility] Summary Report / Loop Recorder

## 2024-01-04 NOTE — Progress Notes (Signed)
 NEUROLOGY FOLLOW UP OFFICE NOTE  Brandon Summers [id number]  Assessment/Plan:   Migraine without aura without status migrainosus, not intractable  For preventative:  nortriptyline  10mg  at bedtime For acute treatment:  Tylenol  (with or without caffeine) and baclofen  10mg  Limit use of pain relievers to no more than 2 days out of week to prevent risk of rebound or medication-overuse headache. Keep headache diary Follow up 6 months.  Subjective:  Brandon Summers is a 53 year old right-handed man with HLD, former smoker and history of left thalamic stroke who follows up for migraines.  UPDATE: Started nortriptyline . Doing well Intensity:  moderate Duration:  a couple of hours with medication and nap Frequency:  on average 2 days a month Frequency of abortive medication: 2 days a month  Current acute treatment:  Tylenol , caffeine, baclofen  Current antidepressant:  nortriptyline  10mg  at bedtime Current antiepileptic:  gabapentin  300mg  BID for neuropathic pain    HISTORY: He had large left thalamic stroke on 05/21/2019 presenting as blurred vision, headache, and right sided numbness.  MRI of brain, however, showed an acute large left thalamic infarct as well as significant chronic small vessel ischemic changes.  CTA of head and neck revealed no aneurysm or emergent large vessel stenosis or occlusion. 2D echo demonstrated EF 55-60% with no cardiac source of embolus.  TEE demonstrated no PFO or other cardiac source of embolus. On ASA and atorvastatin .  With implantable loop recorder.   Following discharge, he reported some blurred vision.  Right hand hurts but all extremities feel tired.  He reports that he is drooling from both sides of his face.  He says face feels different but he denies numbness and tingling sensation.  He says when he talks, he tends to talk slowly because if he talks, he gets a left sided headache.  He underwent neuropsychological testing on 05/12/2020 which demonstrated  mild vascular neurocognitive disorder with deficits believed to hinder patient's ability to maintain competitive employment.  He is now on disability.   He continues to have headaches since the stroke.  They are triggered when he gets upset.  They are a severe frontal pressure pain.  They are associated with nausea and photophobia.  Cannot function.  They last 2 days and occur 4 times a month.  He has some right sided neck pain.      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 Swallow whole.     atorvastatin  (LIPITOR) 40 MG tablet Take 1 tablet (40 mg total) by mouth daily. 90 tablet 1   baclofen  (LIORESAL ) 10 MG tablet Take 1 tablet (10 mg total) by mouth 3 (three) times daily as needed for muscle spasms. 90 each 5   blood glucose meter kit and supplies KIT Dispense based on patient and insurance preference. Use up to four times daily as directed. 1 each  0   Cholecalciferol (VITAMIN D ) 50 MCG (2000 UT) CAPS Take 1 capsule by mouth daily. (Patient not taking: Reported on 06/28/2023)     gabapentin  (NEURONTIN ) 300 MG capsule Take 1 capsule (300 mg total) by mouth 2 (two) times daily. 180 capsule 1   hydrOXYzine  (ATARAX ) 10 MG tablet Take 1 tablet (10 mg total) by mouth 3 (three) times daily as needed. 30 tablet 0   nortriptyline  (PAMELOR ) 10 MG capsule Take 1 capsule (10 mg total) by  mouth at bedtime. 30 capsule 5   No current facility-administered medications on file prior to visit.    ALLERGIES: No Known Allergies  FAMILY HISTORY: Family History  Problem Relation Age of Onset   Hypertension Mother    Stroke Mother    Colon cancer Neg Hx    Esophageal cancer Neg Hx    Pancreatic cancer Neg Hx    Stomach cancer Neg Hx       Objective:  Blood pressure 105/64, pulse 68, height 6' 1 (1.854 m), weight 181 lb (82.1 kg), SpO2 100%. General: No acute distress.  Patient appears well-groomed.   Head:  Normocephalic/atraumatic    Juliene Dunnings, DO  CC: Bascom Borer, NP

## 2024-01-05 ENCOUNTER — Ambulatory Visit (INDEPENDENT_AMBULATORY_CARE_PROVIDER_SITE_OTHER): Payer: Medicare Other | Admitting: Neurology

## 2024-01-05 ENCOUNTER — Encounter: Payer: Self-pay | Admitting: Neurology

## 2024-01-05 VITALS — BP 105/64 | HR 68 | Ht 73.0 in | Wt 181.0 lb

## 2024-01-05 DIAGNOSIS — G43009 Migraine without aura, not intractable, without status migrainosus: Secondary | ICD-10-CM | POA: Diagnosis not present

## 2024-01-05 MED ORDER — BACLOFEN 10 MG PO TABS: 10.0000 mg | ORAL_TABLET | Freq: Three times a day (TID) | ORAL | 5 refills | Status: DC | PRN

## 2024-01-05 MED ORDER — NORTRIPTYLINE HCL 10 MG PO CAPS: 10.0000 mg | ORAL_CAPSULE | Freq: Every day | ORAL | 5 refills | Status: DC

## 2024-01-25 ENCOUNTER — Ambulatory Visit (INDEPENDENT_AMBULATORY_CARE_PROVIDER_SITE_OTHER): Payer: Medicare Other

## 2024-01-25 VITALS — Ht 72.0 in | Wt 184.0 lb

## 2024-01-25 DIAGNOSIS — Z0000 Encounter for general adult medical examination without abnormal findings: Secondary | ICD-10-CM | POA: Diagnosis not present

## 2024-01-25 DIAGNOSIS — E119 Type 2 diabetes mellitus without complications: Secondary | ICD-10-CM

## 2024-01-25 DIAGNOSIS — Z1211 Encounter for screening for malignant neoplasm of colon: Secondary | ICD-10-CM

## 2024-01-25 NOTE — Patient Instructions (Signed)
 Brandon Summers , Thank you for taking time to come for your Medicare Wellness Visit. I appreciate your ongoing commitment to your health goals. Please review the following plan we discussed and let me know if I can assist you in the future.   Referrals/Orders/Follow-Ups/Clinician Recommendations:  Next Medicare Annual Wellness Visit:   February 06, 2025 at 2:30 pm telephone visit  You've been referred for a diabetic foot exam. If you haven't heard from them within the next week, please call to make your appointment [facility] [address] [place],  [place]  [address] Phone: [phone]   An order has been placed for a Cologuard for you. They will mail you the kit with instructions on how to obtain the sample and send it back in to be tested. If you do not received your kit, please call our office and let us know.    This is a list of the screening recommended for you and due dates:  Health Maintenance  Topic Date Due   Pneumococcal Vaccination (1 of 2 - PCV) Never done   Complete foot exam   Never done   Eye exam for diabetics  Never done   DTaP/Tdap/Td vaccine (1 - Tdap) Never done   Cologuard (Stool DNA test)  Never done   Zoster (Shingles) Vaccine (1 of 2) Never done   COVID-19 Vaccine (3 - 2024-25 season) 07/30/2023   Yearly kidney health urinalysis for diabetes  12/15/2023   Flu Shot  02/26/2024*   Hemoglobin A1C  03/27/2024   Yearly kidney function blood test for diabetes  06/22/2024   Medicare Annual Wellness Visit  01/24/2025   Hepatitis C Screening  Completed   HIV Screening  Completed   HPV Vaccine  Aged Out  *Topic was postponed. The date shown is not the original due date.    Advanced directives: (Declined) Advance directive discussed with you today. Even though you declined this today, please call our office should you change your mind, and we can give you the proper paperwork for you to fill out.  Next Medicare Annual Wellness  Visit scheduled for next year: yes  Understanding Your Risk for Falls Millions of people have serious injuries from falls each year. It is important to understand your risk of falling. Talk with your health care provider about your risk and what you can do to lower it. If you do have a serious fall, make sure to tell your provider. Falling once raises your risk of falling again. How can falls affect me? Serious injuries from falls are common. These include: Broken bones, such as hip fractures. Head injuries, such as traumatic brain injuries (TBI) or concussions. A fear of falling can cause you to avoid activities and stay at home. This can make your muscles weaker and raise your risk for a fall. What can increase my risk? There are a number of risk factors that increase your risk for falling. The more risk factors you have, the higher your risk of falling. Serious injuries from a fall happen most often to people who are older than 51 years old. Teenagers and young adults ages 36-29 are also at higher risk. Common risk factors include: Weakness in the lower body. Being generally weak or confused due to long-term (chronic) illness. Dizziness or balance problems. Poor vision. Medicines that cause dizziness or drowsiness. These may include: Medicines for your blood pressure, heart, anxiety, insomnia, or swelling (edema). Pain medicines. Muscle relaxants. Other risk  factors include: Drinking alcohol. Having had a fall in the past. Having foot pain or wearing improper footwear. Working at a dangerous job. Having any of the following in your home: Tripping hazards, such as floor clutter or loose rugs. Poor lighting. Pets. Having dementia or memory loss. What actions can I take to lower my risk of falling?     Physical activity Stay physically fit. Do strength and balance exercises. Consider taking a regular class to build strength and balance. Yoga and tai chi are good  options. Vision Have your eyes checked every year and your prescription for glasses or contacts updated as needed. Shoes and walking aids Wear non-skid shoes. Wear shoes that have rubber soles and low heels. Do not wear high heels. Do not walk around the house in socks or slippers. Use a cane or walker as told by your provider. Home safety Attach secure railings on both sides of your stairs. Install grab bars for your bathtub, shower, and toilet. Use a non-skid mat in your bathtub or shower. Attach bath mats securely with double-sided, non-slip rug tape. Use good lighting in all rooms. Keep a flashlight near your bed. Make sure there is a clear path from your bed to the bathroom. Use night-lights. Do not use throw rugs. Make sure all carpeting is taped or tacked down securely. Remove all clutter from walkways and stairways, including extension cords. Repair uneven or broken steps and floors. Avoid walking on icy or slippery surfaces. Walk on the grass instead of on icy or slick sidewalks. Use ice melter to get rid of ice on walkways in the winter. Use a cordless phone. Questions to ask your health care provider Can you help me check my risk for a fall? Do any of my medicines make me more likely to fall? Should I take a vitamin D supplement? What exercises can I do to improve my strength and balance? Should I make an appointment to have my vision checked? Do I need a bone density test to check for weak bones (osteoporosis)? Would it help to use a cane or a walker? Where to find more information Centers for Disease Control and Prevention, STEADI: TonerPromos.no Community-Based Fall Prevention Programs: TonerPromos.no [employer] on Aging: BaseRingTones.pl Contact a health care provider if: You fall at home. You are afraid of falling at home. You feel weak, drowsy, or dizzy. This information is not intended to replace advice given to you by your health care provider. Make sure you discuss any  questions you have with your health care provider. Document Revised: 07/18/2022 Document Reviewed: 07/18/2022 Elsevier Patient Education  2024 [employer].

## 2024-01-25 NOTE — Progress Notes (Signed)
 Because this visit was a virtual/telehealth visit,  certain criteria was not obtained, such a blood pressure, CBG if applicable, and timed get up and go. Any medications not marked as "taking" were not mentioned during the medication reconciliation part of the visit. Any vitals not documented were not able to be obtained due to this being a telehealth visit or patient was unable to self-report a recent blood pressure reading due to a lack of equipment at home via telehealth. Vitals that have been documented are verbally provided by the patient.   Subjective:   Brandon Summers is a 53 y.o. who presents for a Medicare Wellness preventive visit.  Visit Complete: Virtual I connected with  Brandon Summers on 01/25/24 by a audio enabled telemedicine application and verified that I am speaking with the correct person using two identifiers.  Patient Location: Home  Provider Location: Home Office  I discussed the limitations of evaluation and management by telemedicine. The patient expressed understanding and agreed to proceed.  Vital Signs: Because this visit was a virtual/telehealth visit, some criteria may be missing or patient reported. Any vitals not documented were not able to be obtained and vitals that have been documented are patient reported.  VideoDeclined- This patient declined Librarian, academic. Therefore the visit was completed with audio only.  AWV Questionnaire: No: Patient Medicare AWV questionnaire was not completed prior to this visit.  Cardiac Risk Factors include: advanced age (>85men, >23 women);male gender;dyslipidemia;Other (see comment), Risk factor comments: CHF, CVA, pacemaker     Objective:    Today's Vitals   01/25/24 1407  Weight: 184 lb (83.5 kg)  Height: 6' (1.829 m)   Body mass index is 24.95 kg/m.     01/25/2024    2:12 PM 06/28/2023    1:45 PM 01/19/2023    2:50 PM 06/11/2021   10:05 AM 12/07/2020    8:17 AM 06/04/2020    3:09 PM  12/04/2019   12:59 PM  Advanced Directives  Does Patient Have a Medical Advance Directive? No No No No No No No  Would patient like information on creating a medical advance directive? No - Patient declined  No - Patient declined        Current Medications (verified) Outpatient Encounter Medications as of 01/25/2024  Medication Sig   aspirin EC 81 MG tablet Take 81 mg by mouth daily. Swallow whole.   atorvastatin (LIPITOR) 40 MG tablet Take 1 tablet (40 mg total) by mouth daily.   baclofen (LIORESAL) 10 MG tablet Take 1 tablet (10 mg total) by mouth 3 (three) times daily as needed for muscle spasms.   blood glucose meter kit and supplies KIT Dispense based on patient and insurance preference. Use up to four times daily as directed.   Cholecalciferol (VITAMIN D) 50 MCG (2000 UT) CAPS Take 1 capsule by mouth daily.   gabapentin (NEURONTIN) 300 MG capsule Take 1 capsule (300 mg total) by mouth 2 (two) times daily.   hydrOXYzine (ATARAX) 10 MG tablet Take 1 tablet (10 mg total) by mouth 3 (three) times daily as needed.   nortriptyline (PAMELOR) 10 MG capsule Take 1 capsule (10 mg total) by mouth at bedtime.   No facility-administered encounter medications on file as of 01/25/2024.    Allergies (verified) Patient has no known allergies.   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   Nonintractable headache 04/15/2020   Pacemaker    Right sided weakness 09/09/2019   Shortness of breath    Visual problems    Vitamin D deficiency 11/2019   Past Surgical History:  Procedure Laterality Date   BUBBLE STUDY  05/23/2019   Procedure: BUBBLE STUDY;  Surgeon: Chrystie Nose, MD;  Location: [facility] ENDOSCOPY;  Service: Cardiovascular;;   implantable loop recorder placement  07/26/2019   MDT  Reveal NUUV2 implanted by Dr Johney Frame for cryptogenic stroke   TEE WITHOUT CARDIOVERSION N/A 05/23/2019   Procedure: TRANSESOPHAGEAL ECHOCARDIOGRAM (TEE);  Surgeon: Chrystie Nose, MD;  Location: [facility] ENDOSCOPY;  Service: Cardiovascular;  Laterality: N/A;   Family History  Problem Relation Age of Onset   Hypertension Mother    Stroke Mother    Colon cancer Neg Hx    Esophageal cancer Neg Hx    Pancreatic cancer Neg Hx    Stomach cancer Neg Hx    Social History   Socioeconomic History   Marital status: Married    Spouse name: Not on file   Number of children: Not on file   Years of education: 12   Highest education level: High school graduate  Occupational History   Occupation: Chef  Tobacco Use   Smoking status: Former    Current packs/day: 0.00    Types: Cigarettes    Quit date: 05/21/2019    Years since quitting: 4.6   Smokeless tobacco: Never  Vaping Use   Vaping status: Never Used  Substance and Sexual Activity   Alcohol use: Not Currently   Drug use: No   Sexual activity: Not Currently  Other Topics Concern   Not on file  Social History Narrative   Marital status: separated; moved from [place] to [place] 1998   Children: 2 children; no grandchildren.   Employment: chef   Tobacco: 1/2ppd   Alcohol: none   Drugs: none   Exercise: none.    no Caffeine   Lives in a condo in a one story floor plan   Social Drivers of Health   Financial Resource Strain: Low Risk  (01/25/2024)   Overall Financial Resource Strain (CARDIA)    Difficulty of Paying Living Expenses: Not hard at all  Food Insecurity: No Food Insecurity (01/25/2024)   Hunger Vital Sign    Worried About Running Out of Food in the Last Year: Never true    Ran Out of Food in the Last Year: Never true  Transportation Needs: No Transportation Needs (01/25/2024)   PRAPARE - Administrator, Civil Service (Medical): No    Lack of Transportation (Non-Medical): No  Physical Activity: Insufficiently Active  (01/25/2024)   Exercise Vital Sign    Days of Exercise per Week: 3 days    Minutes of Exercise per Session: 10 min  Stress: Stress Concern Present (01/25/2024)   [employer] of Occupational Health - Occupational Stress Questionnaire    Feeling of Stress : Very much  Social Connections: Moderately Isolated (01/25/2024)   Social Connection and Isolation Panel [NHANES]    Frequency of Communication with Friends and Family: More than three times a week    Frequency of Social Gatherings with Friends and Family: More than three times a week    Attends Religious Services: More than 4 times per year    Active Member of [employer] or Organizations: No    Attends Banker Meetings: Never  Marital Status: Divorced    Tobacco Counseling Counseling given: Yes    Clinical Intake:  Pre-visit preparation completed: Yes  Pain : No/denies pain     BMI - recorded: 24.95 Nutritional Status: BMI of 19-24  Normal Nutritional Risks: None Diabetes: Yes CBG done?: No Did pt. bring in CBG monitor from home?: No  How often do you need to have someone help you when you read instructions, pamphlets, or other written materials from your doctor or pharmacy?: 1 - Never  Interpreter Needed?: No  Information entered by :: Maryjean Ka CMA   Activities of Daily Living     01/25/2024    2:10 PM  In your present state of health, do you have any difficulty performing the following activities:  Hearing? 0  Vision? 0  Difficulty concentrating or making decisions? 0  Walking or climbing stairs? 0  Dressing or bathing? 0  Doing errands, shopping? 0  Preparing Food and eating ? N  Using the Toilet? N  In the past six months, have you accidently leaked urine? N  Do you have problems with loss of bowel control? N  Managing your Medications? N  Managing your Finances? N  Housekeeping or managing your Housekeeping? N    Patient Care Team: Ivonne Andrew, NP as PCP - General (Pulmonary  Disease) Drema Dallas, DO as Consulting Physician (Neurology) [facility] [place] (Optometry) Mealor, Roberts Gaudy, MD as Consulting Physician (Cardiology)  Indicate any recent Medical Services you may have received from other than [facility] providers in the past year (date may be approximate).     Assessment:   This is a routine wellness examination for [place].  Hearing/Vision screen Hearing Screening - Comments:: Patient denies any hearing difficulties.   Vision Screening - Comments:: Patient states he is up to date on yearly eye exams, but he can't remember the name of the provider.    Goals Addressed             This Visit's Progress    Patient Stated       I just want to feel better        Depression Screen     01/25/2024    2:14 PM 09/27/2023   10:17 AM 06/23/2023   10:15 AM 01/19/2023    2:33 PM 12/14/2022    2:53 PM 08/12/2022    3:06 PM 02/10/2022    3:14 PM  PHQ 2/9 Scores  PHQ - 2 Score 0 0 1 0 0 0 0  PHQ- 9 Score 0 0    0     Fall Risk     01/25/2024    2:14 PM 06/28/2023    1:45 PM 01/19/2023    2:49 PM 08/12/2022    3:06 PM 02/10/2022    3:13 PM  Fall Risk   Falls in the past year? 0 0 0 0 0  Number falls in past yr: 0 0 0 0 0  Injury with Fall? 0 0 0 0 0  Risk for fall due to : No Fall Risks  No Fall Risks No Fall Risks No Fall Risks  Follow up Falls prevention discussed;Falls evaluation completed Falls evaluation completed Falls prevention discussed Falls evaluation completed Falls evaluation completed    MEDICARE RISK AT HOME:  Medicare Risk at Home Any stairs in or around the home?: Yes If so, are there any without handrails?: No Home free of loose throw rugs in walkways, pet beds, electrical cords, etc?: Yes  Adequate lighting in your home to reduce risk of falls?: Yes Life alert?: No Use of a cane, walker or w/c?: Yes Grab bars in the bathroom?: Yes Shower chair or bench in shower?: Yes Elevated toilet seat or a handicapped  toilet?: Yes  TIMED UP AND GO:  Was the test performed?  No  Cognitive Function: 6CIT completed        01/25/2024    2:14 PM 01/19/2023    2:51 PM  6CIT Screen  What Year? 0 points 4 points  What month? 0 points 3 points  What time? 0 points 3 points  Count back from 20 0 points 0 points  Months in reverse 0 points 4 points  Repeat phrase 0 points 10 points  Total Score 0 points 24 points    Immunizations Immunization History  Administered Date(s) Administered   Influenza,inj,Quad PF,6+ Mos 12/14/2022   PFIZER(Purple Top)SARS-COV-2 Vaccination 05/20/2020, 11/17/2020    Screening Tests Health Maintenance  Topic Date Due   Pneumococcal Vaccine 32-39 Years old (1 of 2 - PCV) Never done   FOOT EXAM  Never done   OPHTHALMOLOGY EXAM  Never done   DTaP/Tdap/Td (1 - Tdap) Never done   Fecal DNA (Cologuard)  Never done   Zoster Vaccines- Shingrix (1 of 2) Never done   COVID-19 Vaccine (3 - 2024-25 season) 07/30/2023   Diabetic kidney evaluation - Urine ACR  12/15/2023   INFLUENZA VACCINE  02/26/2024 (Originally 06/29/2023)   HEMOGLOBIN A1C  03/27/2024   Diabetic kidney evaluation - eGFR measurement  06/22/2024   Medicare Annual Wellness (AWV)  01/24/2025   Hepatitis C Screening  Completed   HIV Screening  Completed   HPV VACCINES  Aged Out    Health Maintenance  Health Maintenance Due  Topic Date Due   Pneumococcal Vaccine 72-63 Years old (1 of 2 - PCV) Never done   FOOT EXAM  Never done   OPHTHALMOLOGY EXAM  Never done   DTaP/Tdap/Td (1 - Tdap) Never done   Fecal DNA (Cologuard)  Never done   Zoster Vaccines- Shingrix (1 of 2) Never done   COVID-19 Vaccine (3 - 2024-25 season) 07/30/2023   Diabetic kidney evaluation - Urine ACR  12/15/2023   Health Maintenance Items Addressed: Cologuard Ordered, UACR (Urine Albumin:Creatinine Ratio), See Nurse Notes Podiatry referral placed for diabetic foot exam.   Additional Screening:  Vision Screening: Recommended annual  ophthalmology exams for early detection of glaucoma and other disorders of the eye.  Dental Screening: Recommended annual dental exams for proper oral hygiene  Community Resource Referral / Chronic Care Management: CRR required this visit?  No   CCM required this visit?  No     Plan:     I have personally reviewed and noted the following in the patient's chart:   Medical and social history Use of alcohol, tobacco or illicit drugs  Current medications and supplements including opioid prescriptions. Patient is not currently taking opioid prescriptions. Functional ability and status Nutritional status Physical activity Advanced directives List of other physicians Hospitalizations, surgeries, and ER visits in previous 12 months Vitals Screenings to include cognitive, depression, and falls Referrals and appointments  In addition, I have reviewed and discussed with patient certain preventive protocols, quality metrics, and best practice recommendations. A written personalized care plan for preventive services as well as general preventive health recommendations were provided to patient.     Jordan Hawks Jenavie Stanczak, CMA   01/25/2024   After Visit Summary: (Mail) Due to this being  a telephonic visit, the after visit summary with patients personalized plan was offered to patient via mail   Notes: Please refer to Routing Comments.

## 2024-01-29 ENCOUNTER — Ambulatory Visit: Payer: Medicare Other

## 2024-01-29 DIAGNOSIS — I639 Cerebral infarction, unspecified: Secondary | ICD-10-CM | POA: Diagnosis not present

## 2024-01-31 ENCOUNTER — Encounter: Payer: Self-pay | Admitting: Cardiovascular Disease

## 2024-01-31 LAB — CUP PACEART REMOTE DEVICE CHECK
Date Time Interrogation Session: 20250302230119
Implantable Pulse Generator Implant Date: 20200828

## 2024-02-02 NOTE — Progress Notes (Signed)
 [facility] Summary Report / Loop Recorder

## 2024-02-07 ENCOUNTER — Encounter: Payer: Self-pay | Admitting: Podiatry

## 2024-02-07 ENCOUNTER — Ambulatory Visit (INDEPENDENT_AMBULATORY_CARE_PROVIDER_SITE_OTHER): Payer: Medicare Other | Admitting: Podiatry

## 2024-02-07 DIAGNOSIS — I639 Cerebral infarction, unspecified: Secondary | ICD-10-CM | POA: Diagnosis not present

## 2024-02-07 DIAGNOSIS — E119 Type 2 diabetes mellitus without complications: Secondary | ICD-10-CM

## 2024-02-07 NOTE — Progress Notes (Signed)
 This patient presents to the office for diabetic foot exam.  Patient was referred to the office by  his doctor.    This patient says there is no pain or discomfort in her feet.  No history of infection or drainage.  This patient presents to the office for foot exam due to having a history of diabetes.  Vascular  Dorsalis pedis are palpable  B/L. Posterior tibial pulses are weakly palpable. Capillary return  WNL.  Temperature gradient is  WNL.  Skin turgor  WNL  Sensorium  Senn Weinstein monofilament wire  WNL. Normal tactile sensation.  Nail Exam  Patient has normal nails with no evidence of bacterial or fungal infection.  Orthopedic  Exam  Muscle tone and muscle strength  WNL.  No limitations of motion feet  B/L.  No crepitus or joint effusion noted.  Foot type is unremarkable and digits show no abnormalities.  Bony prominences are unremarkable.  Skin  No open lesions.  Normal skin texture and turgor.   Diabetes with no complications  Diabetic foot exam was performed.  There is no evidence of vascular or neurologic pathology.  RTC   Helane Gunther DPM

## 2024-02-09 LAB — COLOGUARD: COLOGUARD: NEGATIVE

## 2024-03-01 NOTE — Addendum Note (Signed)
 Addended by: Geralyn Flash D on: 03/01/2024 02:07 PM   Modules accepted: Orders

## 2024-03-01 NOTE — Progress Notes (Signed)
 [facility] Summary Report / Loop Recorder

## 2024-03-04 ENCOUNTER — Ambulatory Visit (INDEPENDENT_AMBULATORY_CARE_PROVIDER_SITE_OTHER): Payer: Medicare Other

## 2024-03-04 DIAGNOSIS — I639 Cerebral infarction, unspecified: Secondary | ICD-10-CM | POA: Diagnosis not present

## 2024-03-05 LAB — CUP PACEART REMOTE DEVICE CHECK
Date Time Interrogation Session: 20250406230102
Implantable Pulse Generator Implant Date: 20200828

## 2024-03-10 ENCOUNTER — Encounter: Payer: Self-pay | Admitting: Cardiovascular Disease

## 2024-03-27 ENCOUNTER — Ambulatory Visit (INDEPENDENT_AMBULATORY_CARE_PROVIDER_SITE_OTHER): Payer: Self-pay | Admitting: Nurse Practitioner

## 2024-03-27 ENCOUNTER — Encounter: Payer: Self-pay | Admitting: Nurse Practitioner

## 2024-03-27 VITALS — BP 107/80 | HR 68 | Temp 98.6°F | Wt 180.8 lb

## 2024-03-27 DIAGNOSIS — Z13220 Encounter for screening for lipoid disorders: Secondary | ICD-10-CM | POA: Diagnosis not present

## 2024-03-27 DIAGNOSIS — E119 Type 2 diabetes mellitus without complications: Secondary | ICD-10-CM

## 2024-03-27 LAB — POCT GLYCOSYLATED HEMOGLOBIN (HGB A1C): Hemoglobin A1C: 6.9 % — AB (ref 4.0–5.6)

## 2024-03-27 NOTE — Progress Notes (Signed)
 Subjective   Patient ID: Brandon Summers, male    DOB: Mar 15, 1971, 53 y.o.   MRN: [id number]  Chief Complaint  Patient presents with   Diabetes    Follow up    Referring provider: Jerrlyn Morel, NP  Brandon Summers is a 53 y.o. male with Past Medical History: No date: CHF (congestive heart failure) (HCC) No date: Cough 05/21/2019: CVA (cerebral vascular accident)     Comment:  Left thalamic stroke 05/23/2019: HLD (hyperlipidemia) 05/12/2020: Mild vascular neurocognitive disorder 04/15/2020: Nonintractable headache No date: Pacemaker 09/09/2019: Right sided weakness No date: Shortness of breath No date: Visual problems 11/2019: Vitamin D  deficiency  HPI  Patient is not checking home blood sugars.   Home blood sugar records: patient does not check sugars How often is blood sugars being checked: N/A Current symptoms/problems include  dizzy  and have been unchanged. Daily foot checks: no     Any foot concerns: none  Last eye exam: over one year Exercise:  staying active every day A1C in office today is 6.9    No Known Allergies  Immunization History  Administered Date(s) Administered   Influenza,inj,Quad PF,6+ Mos 12/14/2022   PFIZER(Purple Top)SARS-COV-2 Vaccination 05/20/2020, 11/17/2020    Tobacco History: Social History   Tobacco Use  Smoking Status Former   Current packs/day: 0.00   Types: Cigarettes   Quit date: 05/21/2019   Years since quitting: 4.8  Smokeless Tobacco Never   Counseling given: Not Answered   Outpatient Encounter Medications as of 03/27/2024  Medication Sig   aspirin  EC 81 MG tablet Take 81 mg by mouth daily. Swallow whole.   atorvastatin  (LIPITOR) 40 MG tablet Take 1 tablet (40 mg total) by mouth daily.   baclofen  (LIORESAL ) 10 MG tablet Take 1 tablet (10 mg total) by mouth 3 (three) times daily as needed for muscle spasms.   blood glucose meter kit and supplies KIT Dispense based on patient and insurance preference. Use up to four  times daily as directed.   Cholecalciferol (VITAMIN D ) 50 MCG (2000 UT) CAPS Take 1 capsule by mouth daily.   hydrOXYzine  (ATARAX ) 10 MG tablet Take 1 tablet (10 mg total) by mouth 3 (three) times daily as needed.   nortriptyline  (PAMELOR ) 10 MG capsule Take 1 capsule (10 mg total) by mouth at bedtime.   gabapentin  (NEURONTIN ) 300 MG capsule Take 1 capsule (300 mg total) by mouth 2 (two) times daily.   No facility-administered encounter medications on file as of 03/27/2024.    Review of Systems  Review of Systems  Constitutional: Negative.   HENT: Negative.    Cardiovascular: Negative.   Gastrointestinal: Negative.   Allergic/Immunologic: Negative.   Neurological: Negative.   Psychiatric/Behavioral: Negative.       Objective:   BP 107/80   Pulse 68   Temp 98.6 F (37 C) (Oral)   Wt 180 lb 12.8 oz (82 kg)   SpO2 100%   BMI 24.52 kg/m   Wt Readings from Last 5 Encounters:  03/27/24 180 lb 12.8 oz (82 kg)  01/25/24 184 lb (83.5 kg)  01/05/24 181 lb (82.1 kg)  09/27/23 174 lb (78.9 kg)  06/28/23 172 lb 3.2 oz (78.1 kg)     Physical Exam Vitals and nursing note reviewed.  Constitutional:      General: He is not in acute distress.    Appearance: He is well-developed.  Cardiovascular:     Rate and Rhythm: Normal rate and regular rhythm.  Pulmonary:  Effort: Pulmonary effort is normal.     Breath sounds: Normal breath sounds.  Skin:    General: Skin is warm and dry.  Neurological:     Mental Status: He is alert and oriented to person, place, and time.       Assessment & Plan:   Lipid screening -     Lipid panel  Diabetes mellitus type 2, diet-controlled (HCC) -     Microalbumin / creatinine urine ratio -     POCT glycosylated hemoglobin (Hb A1C) -     CBC -     Comprehensive metabolic panel with GFR -     Lipid panel     Return in about 6 months (around 09/26/2024).     Jerrlyn Morel, NP 03/27/2024

## 2024-03-27 NOTE — Patient Instructions (Addendum)
 1. Diabetes mellitus type 2, diet-controlled (HCC)  - Urine Albumin/Creatinine with ratio (send out) [LAB689] - POCT glycosylated hemoglobin (Hb A1C)

## 2024-03-28 LAB — COMPREHENSIVE METABOLIC PANEL WITH GFR
ALT: 33 IU/L (ref 0–44)
AST: 23 IU/L (ref 0–40)
Albumin: 3.5 g/dL — ABNORMAL LOW (ref 3.8–4.9)
Alkaline Phosphatase: 54 IU/L (ref 44–121)
BUN/Creatinine Ratio: 10 (ref 9–20)
BUN: 12 mg/dL (ref 6–24)
Bilirubin Total: 0.3 mg/dL (ref 0.0–1.2)
CO2: 24 mmol/L (ref 20–29)
Calcium: 8.9 mg/dL (ref 8.7–10.2)
Chloride: 105 mmol/L (ref 96–106)
Creatinine, Ser: 1.15 mg/dL (ref 0.76–1.27)
Globulin, Total: 1.8 g/dL (ref 1.5–4.5)
Glucose: 112 mg/dL — ABNORMAL HIGH (ref 70–99)
Potassium: 5 mmol/L (ref 3.5–5.2)
Sodium: 141 mmol/L (ref 134–144)
Total Protein: 5.3 g/dL — ABNORMAL LOW (ref 6.0–8.5)
eGFR: 77 mL/min/{1.73_m2} (ref >59)

## 2024-03-28 LAB — LIPID PANEL
Chol/HDL Ratio: 2.7 ratio (ref 0.0–5.0)
Cholesterol, Total: 115 mg/dL (ref 100–199)
HDL: 42 mg/dL (ref >39)
LDL Chol Calc (NIH): 61 mg/dL (ref 0–99)
Triglycerides: 54 mg/dL (ref 0–149)
VLDL Cholesterol Cal: 12 mg/dL (ref 5–40)

## 2024-03-28 LAB — CBC
Hematocrit: 48.7 % (ref 37.5–51.0)
Hemoglobin: 14.8 g/dL (ref 13.0–17.7)
MCH: 21.6 pg — ABNORMAL LOW (ref 26.6–33.0)
MCHC: 30.4 g/dL — ABNORMAL LOW (ref 31.5–35.7)
MCV: 71 fL — ABNORMAL LOW (ref 79–97)
Platelets: 268 10*3/uL (ref 150–450)
RBC: 6.86 x10E6/uL — ABNORMAL HIGH (ref 4.14–5.80)
RDW: 18.1 % — ABNORMAL HIGH (ref 11.6–15.4)
WBC: 5.4 10*3/uL (ref 3.4–10.8)

## 2024-03-28 LAB — MICROALBUMIN / CREATININE URINE RATIO
Creatinine, Urine: 156.8 mg/dL
Microalb/Creat Ratio: <2 mg/g{creat} (ref 0–29)
Microalbumin, Urine: <3 ug/mL

## 2024-04-08 ENCOUNTER — Ambulatory Visit (INDEPENDENT_AMBULATORY_CARE_PROVIDER_SITE_OTHER): Payer: Medicare Other

## 2024-04-08 DIAGNOSIS — I639 Cerebral infarction, unspecified: Secondary | ICD-10-CM

## 2024-04-09 LAB — CUP PACEART REMOTE DEVICE CHECK
Date Time Interrogation Session: 20250511233137
Implantable Pulse Generator Implant Date: 20200828

## 2024-04-12 ENCOUNTER — Ambulatory Visit: Payer: Self-pay | Admitting: Cardiovascular Disease

## 2024-04-24 NOTE — Addendum Note (Signed)
 Addended by: Edra Govern D on: 04/24/2024 09:19 AM   Modules accepted: Orders

## 2024-04-24 NOTE — Progress Notes (Signed)
 [facility] Summary Report / Loop Recorder

## 2024-05-09 ENCOUNTER — Ambulatory Visit

## 2024-05-09 DIAGNOSIS — I639 Cerebral infarction, unspecified: Secondary | ICD-10-CM

## 2024-05-09 LAB — CUP PACEART REMOTE DEVICE CHECK
Date Time Interrogation Session: 20250611230151
Implantable Pulse Generator Implant Date: 20200828

## 2024-05-11 ENCOUNTER — Ambulatory Visit: Payer: Self-pay | Admitting: Cardiovascular Disease

## 2024-05-21 ENCOUNTER — Telehealth: Payer: Self-pay | Admitting: Nurse Practitioner

## 2024-05-21 NOTE — Telephone Encounter (Signed)
 Copied from CRM [id number]. Topic: General - Other >> May 21, 2024 12:41 PM Fonda T wrote: Reason for CRM: Patient calling, inquiring on status of forms dropped off at office to be completed.   Per patient, Medical/dental Forms dropped off about 1 week ago, and has not heard anything.  Please call patient back to advise of status of forms at [phone].

## 2024-05-28 NOTE — Progress Notes (Signed)
 [facility] Summary Report / Loop Recorder

## 2024-05-28 NOTE — Addendum Note (Signed)
 Addended by: TAWNI DRILLING D on: 05/28/2024 10:00 AM   Modules accepted: Orders

## 2024-06-10 ENCOUNTER — Ambulatory Visit (INDEPENDENT_AMBULATORY_CARE_PROVIDER_SITE_OTHER)

## 2024-06-10 DIAGNOSIS — I639 Cerebral infarction, unspecified: Secondary | ICD-10-CM

## 2024-06-11 LAB — CUP PACEART REMOTE DEVICE CHECK
Date Time Interrogation Session: 20250713230425
Implantable Pulse Generator Implant Date: 20200828

## 2024-06-19 ENCOUNTER — Ambulatory Visit: Payer: Self-pay | Admitting: Cardiovascular Disease

## 2024-06-28 NOTE — Progress Notes (Signed)
 [facility] Summary Report / Loop Recorder

## 2024-07-09 NOTE — Progress Notes (Signed)
 NEUROLOGY FOLLOW UP OFFICE NOTE  Brandon Summers [id number]  Assessment/Plan:   Migraine without aura without status migrainosus, not intractable  For preventative:  nortriptyline  10mg  at bedtime For acute treatment:  Tylenol  (with or without caffeine) and baclofen  10mg  Limit use of pain relievers to no more than 9 days out of the month to prevent risk of rebound or medication-overuse headache. Keep headache diary Follow up one year  Subjective:  Brandon Summers is a 53 year old right-handed man with HLD, former smoker and history of left thalamic stroke who follows up for migraines.  UPDATE: Doing well  Tries to avoid stress when he can Intensity:  moderate Duration:  a couple of hours with medication and nap Frequency:  on average 2 days a month Frequency of abortive medication: 2 days a month  Current acute treatment:  Tylenol , caffeine, baclofen  Current antidepressant:  nortriptyline  10mg  at bedtime Current antiepileptic:  gabapentin  300mg  BID for neuropathic pain    HISTORY: He had large left thalamic stroke on 05/21/2019 presenting as blurred vision, headache, and right sided numbness.  MRI of brain, however, showed an acute large left thalamic infarct as well as significant chronic small vessel ischemic changes.  CTA of head and neck revealed no aneurysm or emergent large vessel stenosis or occlusion. 2D echo demonstrated EF 55-60% with no cardiac source of embolus.  TEE demonstrated no PFO or other cardiac source of embolus. On ASA and atorvastatin .  With implantable loop recorder.   Following discharge, he reported some blurred vision.  Right hand hurts but all extremities feel tired.  He reports that he is drooling from both sides of his face.  He says face feels different but he denies numbness and tingling sensation.  He says when he talks, he tends to talk slowly because if he talks, he gets a left sided headache.  He underwent neuropsychological testing on 05/12/2020  which demonstrated mild vascular neurocognitive disorder with deficits believed to hinder patient's ability to maintain competitive employment.  He is now on disability.   He continues to have headaches since the stroke.  They are triggered when he gets upset.  They are a severe frontal pressure pain.  They are associated with nausea and photophobia.  Cannot function.  They last 2 days and occur 4 times a month.  He has some right sided neck pain.      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 Swallow whole.     atorvastatin  (LIPITOR) 40 MG tablet Take 1 tablet (40 mg total) by mouth daily. 90 tablet 1   baclofen  (LIORESAL ) 10 MG tablet Take 1 tablet (10 mg total) by mouth 3 (three) times daily as needed for muscle spasms. 90 each 5   blood glucose meter kit and supplies KIT Dispense based on patient and insurance preference. Use up to  four times daily as directed. 1 each 0   Cholecalciferol (VITAMIN D ) 50 MCG (2000 UT) CAPS Take 1 capsule by mouth daily.     gabapentin  (NEURONTIN ) 300 MG capsule Take 1 capsule (300 mg total) by mouth 2 (two) times daily. 180 capsule 1   hydrOXYzine  (ATARAX ) 10 MG tablet Take 1 tablet (10 mg total) by mouth 3 (three) times daily as needed. 30 tablet 0   nortriptyline  (PAMELOR ) 10 MG capsule Take 1 capsule (10 mg total) by mouth at bedtime. 30  capsule 5   No current facility-administered medications on file prior to visit.    ALLERGIES: No Known Allergies  FAMILY HISTORY: Family History  Problem Relation Age of Onset   Hypertension Mother    Stroke Mother    Colon cancer Neg Hx    Esophageal cancer Neg Hx    Pancreatic cancer Neg Hx    Stomach cancer Neg Hx       Objective:  Blood pressure 104/78, pulse 71, height 5' 10 (1.778 m), weight 179 lb (81.2 kg), SpO2 100%. General: No acute distress.  Patient appears well-groomed.   Head:  Normocephalic/atraumatic Neck:  Supple.  No paraspinal tenderness.  Full range of motion. Heart:  Regular rate and rhythm. Neuro:  Alert and oriented.  Speech fluent and not dysarthric.  Language intact.  CN II-XII intact.  Bulk and tone normal.  Muscle strength 5/5 throughout.  Sensation to light touch intact.  Deep tendon reflexes 2+ throughout, toes downgoing.  Gait normal.  Romberg negative.     Juliene Dunnings, DO  CC: Bascom Borer, NP

## 2024-07-10 ENCOUNTER — Ambulatory Visit (INDEPENDENT_AMBULATORY_CARE_PROVIDER_SITE_OTHER): Payer: Medicare Other | Admitting: Neurology

## 2024-07-10 ENCOUNTER — Encounter: Payer: Self-pay | Admitting: Neurology

## 2024-07-10 VITALS — BP 104/78 | HR 71 | Ht 70.0 in | Wt 179.0 lb

## 2024-07-10 DIAGNOSIS — G43009 Migraine without aura, not intractable, without status migrainosus: Secondary | ICD-10-CM | POA: Diagnosis not present

## 2024-07-10 MED ORDER — BACLOFEN 10 MG PO TABS: 10.0000 mg | ORAL_TABLET | Freq: Three times a day (TID) | ORAL | 5 refills | Status: AC | PRN

## 2024-07-10 MED ORDER — NORTRIPTYLINE HCL 10 MG PO CAPS: 10.0000 mg | ORAL_CAPSULE | Freq: Every day | ORAL | 11 refills | Status: AC

## 2024-07-10 NOTE — Patient Instructions (Signed)
 Nortriptyline  at bedtime Tylenol  and baclofen  as needed for headache.  Limit use of pain relievers to no more than 9 days out of the month to prevent risk of rebound or medication-overuse headache.

## 2024-07-11 ENCOUNTER — Ambulatory Visit (INDEPENDENT_AMBULATORY_CARE_PROVIDER_SITE_OTHER)

## 2024-07-11 DIAGNOSIS — I639 Cerebral infarction, unspecified: Secondary | ICD-10-CM

## 2024-07-11 LAB — CUP PACEART REMOTE DEVICE CHECK
Date Time Interrogation Session: 20250813230457
Implantable Pulse Generator Implant Date: 20200828

## 2024-07-18 ENCOUNTER — Ambulatory Visit: Payer: Self-pay | Admitting: Cardiovascular Disease

## 2024-07-24 ENCOUNTER — Other Ambulatory Visit: Payer: Self-pay | Admitting: Nurse Practitioner

## 2024-07-24 DIAGNOSIS — R531 Weakness: Secondary | ICD-10-CM

## 2024-07-24 DIAGNOSIS — M79604 Pain in right leg: Secondary | ICD-10-CM

## 2024-08-12 ENCOUNTER — Ambulatory Visit (INDEPENDENT_AMBULATORY_CARE_PROVIDER_SITE_OTHER)

## 2024-08-12 DIAGNOSIS — I639 Cerebral infarction, unspecified: Secondary | ICD-10-CM | POA: Diagnosis not present

## 2024-08-13 LAB — CUP PACEART REMOTE DEVICE CHECK
Date Time Interrogation Session: 20250913230124
Implantable Pulse Generator Implant Date: 20200828

## 2024-08-17 ENCOUNTER — Ambulatory Visit: Payer: Self-pay | Admitting: Cardiovascular Disease

## 2024-08-19 NOTE — Progress Notes (Signed)
 Remote Loop Recorder Transmission

## 2024-08-21 NOTE — Progress Notes (Signed)
 Remote Loop Recorder Transmission

## 2024-08-22 ENCOUNTER — Encounter

## 2024-09-05 NOTE — Progress Notes (Signed)
 Remote Loop Recorder Transmission

## 2024-09-12 ENCOUNTER — Encounter

## 2024-09-13 ENCOUNTER — Ambulatory Visit

## 2024-09-13 DIAGNOSIS — I639 Cerebral infarction, unspecified: Secondary | ICD-10-CM | POA: Diagnosis not present

## 2024-09-14 ENCOUNTER — Other Ambulatory Visit: Payer: Self-pay | Admitting: Nurse Practitioner

## 2024-09-14 DIAGNOSIS — E785 Hyperlipidemia, unspecified: Secondary | ICD-10-CM

## 2024-09-15 LAB — CUP PACEART REMOTE DEVICE CHECK
Date Time Interrogation Session: 20251016230711
Implantable Pulse Generator Implant Date: 20200828

## 2024-09-17 NOTE — Progress Notes (Signed)
 Remote Loop Recorder Transmission

## 2024-09-23 ENCOUNTER — Encounter

## 2024-09-23 ENCOUNTER — Ambulatory Visit: Payer: Self-pay | Admitting: Cardiovascular Disease

## 2024-09-26 ENCOUNTER — Ambulatory Visit: Admitting: Nurse Practitioner

## 2024-10-02 ENCOUNTER — Telehealth: Payer: Self-pay

## 2024-10-02 NOTE — Telephone Encounter (Signed)
 Attempted to contact patient. No answer, left message to call back.   ILR reached [facility]: 10/01/2024  Marked I in Paceart: Yes Enter note in Paceart: Yes Canceled future remotes: Yes Discontinued from website: Yes Entered in Speciality Comments: Yes  Advised if further questions arise to please call the device clinic at [phone].

## 2024-10-02 NOTE — Telephone Encounter (Signed)
 Spoke to patient about ILR @ [facility]. Patient states he would like to have device removed. Advised pt someone from scheduling will contact him for apt. Patient voiced understanding and was appreciative of call.

## 2024-10-14 ENCOUNTER — Encounter

## 2024-10-18 ENCOUNTER — Encounter: Payer: Self-pay | Admitting: Nurse Practitioner

## 2024-10-18 ENCOUNTER — Ambulatory Visit (INDEPENDENT_AMBULATORY_CARE_PROVIDER_SITE_OTHER): Payer: Self-pay | Admitting: Nurse Practitioner

## 2024-10-18 VITALS — BP 114/84 | HR 74 | Wt 182.6 lb

## 2024-10-18 DIAGNOSIS — E119 Type 2 diabetes mellitus without complications: Secondary | ICD-10-CM | POA: Diagnosis not present

## 2024-10-18 DIAGNOSIS — R531 Weakness: Secondary | ICD-10-CM

## 2024-10-18 DIAGNOSIS — M79604 Pain in right leg: Secondary | ICD-10-CM | POA: Diagnosis not present

## 2024-10-18 DIAGNOSIS — E785 Hyperlipidemia, unspecified: Secondary | ICD-10-CM

## 2024-10-18 LAB — POCT GLYCOSYLATED HEMOGLOBIN (HGB A1C): Hemoglobin A1C: 7.5 % — AB (ref 4.0–5.6)

## 2024-10-18 MED ORDER — METFORMIN HCL ER 500 MG PO TB24: 500.0000 mg | ORAL_TABLET | Freq: Every day | ORAL | 0 refills | Status: AC

## 2024-10-18 MED ORDER — GABAPENTIN 300 MG PO CAPS: 300.0000 mg | ORAL_CAPSULE | Freq: Two times a day (BID) | ORAL | 0 refills | Status: DC

## 2024-10-18 MED ORDER — ATORVASTATIN CALCIUM 40 MG PO TABS: 40.0000 mg | ORAL_TABLET | Freq: Every day | ORAL | 1 refills | Status: AC

## 2024-10-18 NOTE — Progress Notes (Signed)
 Subjective   Patient ID: Brandon Summers, male    DOB: 01-05-1971, 53 y.o.   MRN: [id number]  Chief Complaint  Patient presents with   Diabetes   Medication Refill    Atorvastatin , gabapentin     Follow-up    Has questions regarding pacemaker     Referring provider: Oley Brandon RAMAN, NP  Brandon Summers is a 53 y.o. male with Past Medical History: No date: CHF (congestive heart failure) (HCC) No date: Cough 05/21/2019: CVA (cerebral vascular accident)     Comment:  Left thalamic stroke 05/23/2019: HLD (hyperlipidemia) 05/12/2020: Mild vascular neurocognitive disorder 04/15/2020: Nonintractable headache No date: Pacemaker 09/09/2019: Right sided weakness No date: Shortness of breath No date: Visual problems 11/2019: Vitamin D  deficiency   HPI  Patient is not checking home blood sugars.   Home blood sugar records: patient does not check sugars How often is blood sugars being checked: N/A Current symptoms/problems include  dizzy  and have been unchanged. Daily foot checks: no     Any foot concerns: none  Last eye exam: over one year Exercise:  staying active every day A1C in office today is 7.5    No Known Allergies  Immunization History  Administered Date(s) Administered   Influenza,inj,Quad PF,6+ Mos 12/14/2022   PFIZER(Purple Top)SARS-COV-2 Vaccination 05/20/2020, 11/17/2020    Tobacco History: Social History   Tobacco Use  Smoking Status Former   Current packs/day: 0.00   Types: Cigarettes   Quit date: 05/21/2019   Years since quitting: 5.4  Smokeless Tobacco Never   Counseling given: Not Answered   Outpatient Encounter Medications as of 10/18/2024  Medication Sig   aspirin  EC 81 MG tablet Take 81 mg by mouth daily. Swallow whole.   baclofen  (LIORESAL ) 10 MG tablet Take 1 tablet (10 mg total) by mouth 3 (three) times daily as needed for muscle spasms.   blood glucose meter kit and supplies KIT Dispense based on patient and insurance preference. Use up  to four times daily as directed.   Cholecalciferol (VITAMIN D ) 50 MCG (2000 UT) CAPS Take 1 capsule by mouth daily.   hydrOXYzine  (ATARAX ) 10 MG tablet Take 1 tablet (10 mg total) by mouth 3 (three) times daily as needed.   metFORMIN  (GLUCOPHAGE -XR) 500 MG 24 hr tablet Take 1 tablet (500 mg total) by mouth daily with breakfast.   nortriptyline  (PAMELOR ) 10 MG capsule Take 1 capsule (10 mg total) by mouth at bedtime.   [DISCONTINUED] atorvastatin  (LIPITOR) 40 MG tablet TAKE 1 TABLET BY MOUTH EVERY DAY   [DISCONTINUED] gabapentin  (NEURONTIN ) 300 MG capsule TAKE 1 CAPSULE BY MOUTH TWICE A DAY   atorvastatin  (LIPITOR) 40 MG tablet Take 1 tablet (40 mg total) by mouth daily.   gabapentin  (NEURONTIN ) 300 MG capsule Take 1 capsule (300 mg total) by mouth 2 (two) times daily.   No facility-administered encounter medications on file as of 10/18/2024.    Review of Systems  Review of Systems  Constitutional: Negative.   HENT: Negative.    Cardiovascular: Negative.   Gastrointestinal: Negative.   Allergic/Immunologic: Negative.   Neurological: Negative.   Psychiatric/Behavioral: Negative.       Objective:   BP 114/84 (BP Location: Left Arm, Patient Position: Sitting, Cuff Size: Normal)   Pulse 74   Wt 182 lb 9.6 oz (82.8 kg)   SpO2 100%   BMI 26.20 kg/m   Wt Readings from Last 5 Encounters:  10/18/24 182 lb 9.6 oz (82.8 kg)  07/10/24 179 lb (81.2 kg)  03/27/24  180 lb 12.8 oz (82 kg)  01/25/24 184 lb (83.5 kg)  01/05/24 181 lb (82.1 kg)     Physical Exam Vitals and nursing note reviewed.  Constitutional:      General: He is not in acute distress.    Appearance: He is well-developed.  Cardiovascular:     Rate and Rhythm: Normal rate and regular rhythm.  Pulmonary:     Effort: Pulmonary effort is normal.     Breath sounds: Normal breath sounds.  Skin:    General: Skin is warm and dry.  Neurological:     Mental Status: He is alert and oriented to person, place, and time.        Assessment & Plan:   Diabetes mellitus type 2, diet-controlled (HCC) -     POCT glycosylated hemoglobin (Hb A1C) -     metFORMIN  HCl ER; Take 1 tablet (500 mg total) by mouth daily with breakfast.  Dispense: 90 tablet; Refill: 0  Hyperlipidemia, unspecified hyperlipidemia type -     Atorvastatin  Calcium ; Take 1 tablet (40 mg total) by mouth daily.  Dispense: 90 tablet; Refill: 1  Generalized weakness -     Gabapentin ; Take 1 capsule (300 mg total) by mouth 2 (two) times daily.  Dispense: 180 capsule; Refill: 0  Right sided weakness -     Gabapentin ; Take 1 capsule (300 mg total) by mouth 2 (two) times daily.  Dispense: 180 capsule; Refill: 0  Pain of right lower extremity -     Gabapentin ; Take 1 capsule (300 mg total) by mouth 2 (two) times daily.  Dispense: 180 capsule; Refill: 0     Return in about 3 months (around 01/18/2025).     Brandon GORMAN Borer, NP 10/18/2024

## 2024-10-24 ENCOUNTER — Encounter

## 2024-11-14 ENCOUNTER — Encounter

## 2024-11-25 ENCOUNTER — Encounter

## 2024-11-28 ENCOUNTER — Other Ambulatory Visit: Payer: Self-pay | Admitting: Nurse Practitioner

## 2024-11-28 DIAGNOSIS — M79604 Pain in right leg: Secondary | ICD-10-CM

## 2024-11-28 DIAGNOSIS — R531 Weakness: Secondary | ICD-10-CM

## 2024-11-29 ENCOUNTER — Other Ambulatory Visit: Payer: Self-pay | Admitting: Nurse Practitioner

## 2024-11-29 ENCOUNTER — Telehealth: Payer: Self-pay | Admitting: Cardiovascular Disease

## 2024-11-29 DIAGNOSIS — R531 Weakness: Secondary | ICD-10-CM

## 2024-11-29 DIAGNOSIS — M79604 Pain in right leg: Secondary | ICD-10-CM

## 2024-11-29 NOTE — Telephone Encounter (Signed)
 Please advise

## 2024-11-29 NOTE — Telephone Encounter (Signed)
 See 10/02/24 encounter. Patient says he has not heard from anyone regarding having ILR removed. Please advise.

## 2024-11-29 NOTE — Telephone Encounter (Signed)
 Spoke w/ patient - he is scheduled for loop explant with Dr. Nancey on 1/28.

## 2024-11-29 NOTE — Telephone Encounter (Signed)
Please advise on early refill

## 2024-11-29 NOTE — Telephone Encounter (Signed)
 Forwarding to EP scheduler high priority to make appt.  Does not appear last message was routed to scheduling.  Apologize for the delay.

## 2024-11-29 NOTE — Telephone Encounter (Signed)
 gabapentin  (NEURONTIN ) 300 MG capsule [Pharmacy Med Name: GABAPENTIN  300 MG CAPSULE]

## 2024-11-29 NOTE — Telephone Encounter (Signed)
 Copied from CRM [id number]. Topic: Clinical - Prescription Issue >> Nov 29, 2024  1:46 PM Emylou G wrote: Reason for CRM: Patient adv. Lost his Gapapentin.SABRA is the reason for the request too soon.

## 2024-12-02 DIAGNOSIS — R531 Weakness: Secondary | ICD-10-CM

## 2024-12-02 DIAGNOSIS — M79604 Pain in right leg: Secondary | ICD-10-CM

## 2024-12-02 NOTE — Telephone Encounter (Signed)
 Please Advise.  CB.

## 2024-12-02 NOTE — Telephone Encounter (Signed)
 gabapentin  (NEURONTIN ) 300 MG capsule [Pharmacy Med Name: GABAPENTIN  300 MG CAPSULE]

## 2024-12-02 NOTE — Telephone Encounter (Signed)
 FYI Only or Action Required?: Action required by provider: medication refill request.  Patient was last seen in primary care on 10/18/2024 by Oley Bascom RAMAN, NP.  Called Nurse Triage reporting Hip Pain.  Symptoms began a week ago.  Interventions attempted: Nothing.  Symptoms are: gradually worsening.  Triage Disposition: See PCP Within 2 Weeks  Patient/caregiver understands and will follow disposition?: No, wishes to speak with PCP  Reason for Disposition  [1] MILD pain (e.g., does not interfere with normal activities) AND [2] present > 7 days  Answer Assessment - Initial Assessment Questions Pt states he needs another Gabapentin  refill - lost prescription a week ago and has not been able to take medication since. Pt experiencing Right side hip pain, radiates to back and right side.  1. LOCATION and RADIATION: Where is the pain located? Does the pain spread (shoot) anywhere else?     Right hip - radiates to back and right side 2. QUALITY: What does the pain feel like?  (e.g., sharp, dull, aching, burning)     N/a  3. SEVERITY: How bad is the pain? What does it keep you from doing?   (Scale 1-10; or mild, moderate, severe)     N/a  4. ONSET: When did the pain start? Does it come and go, or is it there all the time?     A week ago 5. WORK OR EXERCISE: Has there been any recent work or exercise that involved this part of the body?      N/a  6. CAUSE: What do you think is causing the hip pain?      N/a  7. AGGRAVATING FACTORS: What makes the hip pain worse? (e.g., walking, climbing stairs, running)     N/a  8. OTHER SYMPTOMS: Do you have any other symptoms? (e.g., back pain, pain shooting down leg,  fever, rash)     Back and side pain on right side  Protocols used: Hip Pain-A-AH  Copied from CRM #[id number]. Topic: Clinical - Red Word Triage >> Dec 02, 2024 11:30 AM Deaijah H wrote: Red Word that prompted transfer to Nurse Triage: Feel so much pain on right  side ; lost refill. (needing)

## 2024-12-03 NOTE — Telephone Encounter (Signed)
 Please advise on refill. Patient states that he lost his prescription and needs a refill.

## 2024-12-03 NOTE — Addendum Note (Signed)
 Addended by: COLE GEROGE SQUIBB on: 12/03/2024 08:53 AM   Modules accepted: Orders

## 2024-12-05 ENCOUNTER — Other Ambulatory Visit: Payer: Self-pay | Admitting: Nurse Practitioner

## 2024-12-05 DIAGNOSIS — R531 Weakness: Secondary | ICD-10-CM

## 2024-12-05 DIAGNOSIS — M79604 Pain in right leg: Secondary | ICD-10-CM

## 2024-12-05 MED ORDER — GABAPENTIN 300 MG PO CAPS: 300.0000 mg | ORAL_CAPSULE | Freq: Two times a day (BID) | ORAL | 0 refills | Status: AC

## 2024-12-05 NOTE — Telephone Encounter (Signed)
 Pt has been advise that prescription was sent. CB.

## 2024-12-06 NOTE — Telephone Encounter (Signed)
 Pharmacy requesting if they can fill early. Brandon Summers

## 2024-12-15 ENCOUNTER — Encounter

## 2024-12-16 ENCOUNTER — Encounter

## 2024-12-24 NOTE — Progress Notes (Unsigned)
" °  Electrophysiology Office Note:    Date:  12/25/2024   ID:  Brandon Summers, DOB 05-21-1971, MRN [id number]  PCP:  Brandon Bascom RAMAN, NP   [facility] Providers Cardiologist:  None     Referring MD: Brandon Bascom RAMAN, NP   History of Present Illness:    Brandon Summers is a 54 y.o. male with a medical history significant for cryptogenic stroke, [employer] loop order in place.      History of Present Illness  He suffered a cryptogenic stroke and had an implantable loop recorder placed by Dr. Kelsie in August 2020.  The loop recorder has not detected any arrhythmia and is now at EOL.         Today, he reports that he feels well and has no complaints  EKGs/Labs/Other Studies Reviewed Today:     Echocardiogram:  TTE June 2020 LVEF 55 to 60%.     EKG:   EKG Interpretation Date/Time:  Wednesday December 25 2024 08:19:24 EST Ventricular Rate:  70 PR Interval:  130 QRS Duration:  80 QT Interval:  398 QTC Calculation: 429 R Axis:   73  Text Interpretation: Normal sinus rhythm with sinus arrhythmia Normal ECG When compared with ECG of 12-Oct-2019 23:45, No significant change was found Confirmed by Brandon Summers [phone]) on 12/25/2024 8:29:54 AM     Physical Exam:    VS:  BP 118/76 (BP Location: Right Arm, Patient Position: Sitting, Cuff Size: Normal)   Pulse 70   Ht 5' 10 (1.778 m)   Wt 176 lb (79.8 kg)   SpO2 99%   BMI 25.25 kg/m     Wt Readings from Last 3 Encounters:  12/25/24 176 lb (79.8 kg)  10/18/24 182 lb 9.6 oz (82.8 kg)  07/10/24 179 lb (81.2 kg)     GEN: Well nourished, well developed in no acute distress CARDIAC: RRR  RESPIRATORY:  Normal work of breathing MUSCULOSKELETAL: no edema    ASSESSMENT & PLAN:     Cryptogenic stroke No atrial fibrillation detected on loop recorder, which is now EOL He would like to have the device removed. I explained the explant procedure, risks including minor bleeding minor infection, discomfort.  I  explained that he did not have to have the device removed, and he could even choose to leave it in and change his mind to have it removed in the future.  He would like to proceed today.  After obtaining informed consent, the patient was prepped and draped in usual sterile fashion.  The loop recorder was located in the left prepectoral area.  Approximately 5cc of local anesthetic was injected over the medial end of the device.  A 1.5cm incision was made at this location.  The loop recorder was grasped with sterile forceps.  The scar tissue capsule was incised to release the device, which was then removed with gentle traction.  Steri-Strips and a sterile dressing were applied.   EBL 1 cc.    Signed, Summers FORBES Nancey, MD  12/25/2024 8:33 AM    [facility] "

## 2024-12-25 ENCOUNTER — Ambulatory Visit: Admitting: Cardiovascular Disease

## 2024-12-25 ENCOUNTER — Encounter: Payer: Self-pay | Admitting: Cardiovascular Disease

## 2024-12-25 VITALS — BP 118/76 | HR 70 | Ht 70.0 in | Wt 176.0 lb

## 2024-12-25 DIAGNOSIS — I639 Cerebral infarction, unspecified: Secondary | ICD-10-CM | POA: Diagnosis present

## 2024-12-25 NOTE — Patient Instructions (Signed)
 Medication Instructions:  Your physician recommends that you continue on your current medications as directed. Please refer to the Current Medication list given to you today.  Labwork: None ordered.  Testing/Procedures: None ordered.  Follow-Up: You may follow up with Dr Mealor as needed    Implantable Loop Recorder Removal, Care After This sheet gives you information about how to care for yourself after your procedure. Your health care provider may also give you more specific instructions. If you have problems or questions, contact your health care provider. What can I expect after the procedure? After the procedure, it is common to have: Soreness or discomfort near the incision. Some swelling or bruising near the incision.  Follow these instructions at home: Incision care  Monitor your cardiac device site for redness, swelling, and drainage. Call the device clinic at [phone] if you experience these symptoms or fever/chills.  Keep the large square bandage on your site for 24 hours and then you may remove it yourself. Keep the steri-strips underneath in place.   You may shower after 72 hours / 3 days from your procedure with the steri-strips in place. They will usually fall off on their own, or may be removed after 10 days. Pat dry.   Avoid lotions, ointments, or perfumes over your incision until it is well-healed.  Please do not submerge in water until your site is completely healed.   If your wound site starts to bleed apply pressure.       If you have any questions/concerns please call the device clinic at [phone].  Activity  Return to your normal activities.  Contact a health care provider if: You have redness, swelling, or pain around your incision. You have a fever.

## 2024-12-26 ENCOUNTER — Encounter

## 2025-01-07 ENCOUNTER — Ambulatory Visit

## 2025-01-09 ENCOUNTER — Ambulatory Visit: Payer: Self-pay

## 2025-01-15 ENCOUNTER — Encounter

## 2025-01-17 ENCOUNTER — Ambulatory Visit: Payer: Self-pay | Admitting: Nurse Practitioner

## 2025-02-06 ENCOUNTER — Ambulatory Visit: Admitting: Podiatry

## 2025-02-15 ENCOUNTER — Encounter

## 2025-03-18 ENCOUNTER — Encounter

## 2025-04-18 ENCOUNTER — Encounter

## 2025-07-15 ENCOUNTER — Ambulatory Visit: Admitting: Neurology
